# Patient Record
Sex: Female | Born: 1960
Health system: Southern US, Community
[De-identification: ages and names within clinical notes are randomized; demographics above are authoritative.]

## PROBLEM LIST (undated history)

## (undated) DIAGNOSIS — G43909 Migraine, unspecified, not intractable, without status migrainosus: Secondary | ICD-10-CM

## (undated) DIAGNOSIS — H409 Unspecified glaucoma: Secondary | ICD-10-CM

## (undated) DIAGNOSIS — I10 Essential (primary) hypertension: Secondary | ICD-10-CM

## (undated) DIAGNOSIS — Z86010 Personal history of colonic polyps: Secondary | ICD-10-CM

## (undated) DIAGNOSIS — I6529 Occlusion and stenosis of unspecified carotid artery: Secondary | ICD-10-CM

## (undated) DIAGNOSIS — Q211 Atrial septal defect: Secondary | ICD-10-CM

## (undated) DIAGNOSIS — R519 Headache, unspecified: Secondary | ICD-10-CM

## (undated) DIAGNOSIS — G459 Transient cerebral ischemic attack, unspecified: Secondary | ICD-10-CM

## (undated) DIAGNOSIS — Q2112 Patent foramen ovale: Secondary | ICD-10-CM

## (undated) DIAGNOSIS — K760 Fatty (change of) liver, not elsewhere classified: Secondary | ICD-10-CM

## (undated) DIAGNOSIS — Z86718 Personal history of other venous thrombosis and embolism: Secondary | ICD-10-CM

## (undated) DIAGNOSIS — R51 Headache: Secondary | ICD-10-CM

## (undated) DIAGNOSIS — E785 Hyperlipidemia, unspecified: Secondary | ICD-10-CM

## (undated) DIAGNOSIS — G8929 Other chronic pain: Secondary | ICD-10-CM

## (undated) DIAGNOSIS — E039 Hypothyroidism, unspecified: Secondary | ICD-10-CM

## (undated) HISTORY — PX: UPPER GASTROINTESTINAL ENDOSCOPY: SHX188

## (undated) HISTORY — DX: Occlusion and stenosis of unspecified carotid artery: I65.29

## (undated) HISTORY — DX: Unspecified glaucoma: H40.9

## (undated) HISTORY — DX: Personal history of other venous thrombosis and embolism: Z86.718

## (undated) HISTORY — DX: Transient cerebral ischemic attack, unspecified: G45.9

## (undated) HISTORY — DX: Headache: R51

## (undated) HISTORY — DX: Patent foramen ovale: Q21.12

## (undated) HISTORY — DX: Fatty (change of) liver, not elsewhere classified: K76.0

## (undated) HISTORY — DX: Headache, unspecified: R51.9

## (undated) HISTORY — DX: Hyperlipidemia, unspecified: E78.5

## (undated) HISTORY — PX: COLONOSCOPY: SHX174

## (undated) HISTORY — DX: Migraine, unspecified, not intractable, without status migrainosus: G43.909

## (undated) HISTORY — DX: Other chronic pain: G89.29

## (undated) HISTORY — DX: Atrial septal defect: Q21.1

## (undated) HISTORY — DX: Hypothyroidism, unspecified: E03.9

## (undated) HISTORY — DX: Personal history of colonic polyps: Z86.010

---

## 1980-02-03 DIAGNOSIS — G43909 Migraine, unspecified, not intractable, without status migrainosus: Secondary | ICD-10-CM

## 1980-02-03 HISTORY — DX: Migraine, unspecified, not intractable, without status migrainosus: G43.909

## 1998-02-02 HISTORY — PX: BREAST LUMPECTOMY: SHX2

## 2004-02-03 HISTORY — PX: CAROTID ARTERY - SUBCLAVIAN ARTERY BYPASS GRAFT: SUR178

## 2010-02-02 LAB — HM COLONOSCOPY: HM Colonoscopy: 1

## 2011-07-30 DIAGNOSIS — Z86010 Personal history of colonic polyps: Secondary | ICD-10-CM

## 2011-07-30 DIAGNOSIS — Z860101 Personal history of adenomatous and serrated colon polyps: Secondary | ICD-10-CM | POA: Insufficient documentation

## 2011-07-30 HISTORY — DX: Personal history of colonic polyps: Z86.010

## 2011-07-30 HISTORY — DX: Personal history of adenomatous and serrated colon polyps: Z86.0101

## 2012-09-25 ENCOUNTER — Emergency Department (HOSPITAL_COMMUNITY): Payer: BC Managed Care – PPO

## 2012-09-25 ENCOUNTER — Encounter (HOSPITAL_COMMUNITY): Payer: Self-pay | Admitting: Cardiology

## 2012-09-25 ENCOUNTER — Emergency Department (HOSPITAL_COMMUNITY)
Admission: EM | Admit: 2012-09-25 | Discharge: 2012-09-25 | Disposition: A | Discharge status: Home / Self Care | Payer: BC Managed Care – PPO | Attending: Emergency Medicine | Admitting: Emergency Medicine

## 2012-09-25 DIAGNOSIS — W010XXA Fall on same level from slipping, tripping and stumbling without subsequent striking against object, initial encounter: Secondary | ICD-10-CM | POA: Insufficient documentation

## 2012-09-25 DIAGNOSIS — S0083XA Contusion of other part of head, initial encounter: Secondary | ICD-10-CM

## 2012-09-25 DIAGNOSIS — S0003XA Contusion of scalp, initial encounter: Secondary | ICD-10-CM | POA: Insufficient documentation

## 2012-09-25 DIAGNOSIS — IMO0002 Reserved for concepts with insufficient information to code with codable children: Secondary | ICD-10-CM | POA: Insufficient documentation

## 2012-09-25 DIAGNOSIS — S01501A Unspecified open wound of lip, initial encounter: Secondary | ICD-10-CM | POA: Insufficient documentation

## 2012-09-25 DIAGNOSIS — Y9389 Activity, other specified: Secondary | ICD-10-CM | POA: Insufficient documentation

## 2012-09-25 DIAGNOSIS — Y929 Unspecified place or not applicable: Secondary | ICD-10-CM | POA: Insufficient documentation

## 2012-09-25 LAB — SAMPLE TO BLOOD BANK

## 2012-09-25 LAB — PROTIME-INR: Prothrombin Time: 22.4 seconds — ABNORMAL HIGH (ref 11.6–15.2)

## 2012-09-25 MED ORDER — METHOCARBAMOL 500 MG PO TABS: 500.0000 mg | ORAL_TABLET | Freq: Two times a day (BID) | ORAL | Status: DC

## 2012-09-25 NOTE — ED Notes (Addendum)
Pt reports that she was exercising this afternoon and stepped off a curb and lost her balance. Pt with swelling to the lip, and abrasions noted to the left hand and knee. Denies any LOC. Bleeding controlled. No distress noted. Pt reports that she is on Coumadin

## 2012-09-25 NOTE — ED Provider Notes (Signed)
CSN: [id number]     Arrival date & time 09/25/12  1246 History     First MD Initiated Contact with Patient 09/25/12 1321     Chief Complaint  Patient presents with  . Fall   (Consider location/radiation/quality/duration/timing/severity/associated sxs/prior Treatment) Patient is a 52 y.o. female presenting with fall. The history is provided by the patient and the spouse.  Fall   patient complains of facial pain after a fall today while she was walking. Tripped over a piece of concrete and landed face first. No loss of consciousness. Denies any neck pain. No upper or lower extremity paresthesias. Complains of pain and swelling to her nose as well as her lower lip. Denies any hip pain. Denies any dizziness. She is on Coumadin. Denies any vomiting.  History reviewed. No pertinent past medical history. History reviewed. No pertinent past surgical history. History reviewed. No pertinent family history. History  Substance Use Topics  . Smoking status: Never Smoker   . Smokeless tobacco: Not on file  . Alcohol Use: Yes   OB History   Grav Para Term Preterm Abortions TAB SAB Ect Mult Living                 Review of Systems  All other systems reviewed and are negative.    Allergies  Codeine  Home Medications  No current outpatient prescriptions on file. BP 119/70  Pulse 62  Temp(Src) 97.7 F (36.5 C) (Oral)  Resp 20  SpO2 96% Physical Exam  Nursing note and vitals reviewed. Constitutional: She is oriented to person, place, and time. She appears well-developed and well-nourished.  Non-toxic appearance. No distress.  HENT:  Head: Normocephalic and atraumatic.  Bilateral nasal edema and noted. Small 0.5 cm lower inner lip laceration noted that is not through and through. Left-sided chin abrasion noted.  Eyes: Conjunctivae, EOM and lids are normal. Pupils are equal, round, and reactive to light.  Neck: Normal range of motion. Neck supple. No tracheal deviation present. No mass  present.  Cardiovascular: Normal rate, regular rhythm and normal heart sounds.  Exam reveals no gallop.   No murmur heard. Pulmonary/Chest: Effort normal and breath sounds normal. No stridor. No respiratory distress. She has no decreased breath sounds. She has no wheezes. She has no rhonchi. She has no rales.  Abdominal: Soft. Normal appearance and bowel sounds are normal. She exhibits no distension. There is no tenderness. There is no rebound and no CVA tenderness.  Musculoskeletal: Normal range of motion. She exhibits no edema and no tenderness.       Legs: Neurological: She is alert and oriented to person, place, and time. She has normal strength. No cranial nerve deficit or sensory deficit. GCS eye subscore is 4. GCS verbal subscore is 5. GCS motor subscore is 6.  Skin: Skin is warm and dry. No abrasion and no rash noted.  Psychiatric: She has a normal mood and affect. Her speech is normal and behavior is normal.    ED Course   LACERATION REPAIR Date/Time: 09/25/2012 5:10 PM Performed by: Toy Baker Authorized by: Lorre Nick T Consent: Verbal consent obtained. Risks and benefits: risks, benefits and alternatives were discussed Consent given by: patient Patient identity confirmed: verbally with patient Body area: mouth Location details: lower lip, interior Laceration length: 0.5 cm Nerve involvement: none Vascular damage: no Anesthesia: local infiltration Local anesthetic: lidocaine 1% with epinephrine Anesthetic total: 6 ml Patient sedated: no Preparation: Patient was prepped and draped in the usual sterile fashion. Irrigation  solution: saline Amount of cleaning: standard Debridement: none Degree of undermining: none Mucous membrane closure: 5-0 Chromic gut Number of sutures: 4 Technique: simple Approximation: close Approximation difficulty: simple Patient tolerance: Patient tolerated the procedure well with no immediate complications.   (including critical  care time)  Labs Reviewed - No data to display No results found. No diagnosis found.  MDM  Patient's x-ray results noted.   Toy Baker, MD 09/25/12 [phone]

## 2012-09-25 NOTE — ED Notes (Signed)
Patient back to room.

## 2012-09-25 NOTE — ED Notes (Addendum)
Provided wet washcloths to patient for pain relief of the mouth area. Husband at bedside attentive to patient. Vital signs stable. Patient states no other needs at this time. Call bell within reach. Will continue to monitor patient.

## 2012-09-25 NOTE — ED Notes (Signed)
Patient presents to ED after a fall to the pavement when walking with husband towards the end of their walk together. States that she stepped up on curb and began to fall, without catching herself. She presents with abrasions to the left side of the face, hands, left knee that are no longer bleeding. She also says that she hit her mouth and is still bleeding from the mouth. Provided suction to the patient. Patient is currently on Coumadin and is worried about the bleeding. Vital signs stable. Dr. Freida Busman has seen the patient.

## 2014-02-02 LAB — HM MAMMOGRAPHY

## 2014-02-05 ENCOUNTER — Telehealth: Payer: Self-pay

## 2014-02-05 NOTE — Telephone Encounter (Signed)
error 

## 2014-02-23 ENCOUNTER — Ambulatory Visit: Payer: Self-pay | Admitting: Family

## 2014-03-12 ENCOUNTER — Ambulatory Visit (INDEPENDENT_AMBULATORY_CARE_PROVIDER_SITE_OTHER): Payer: BLUE CROSS/BLUE SHIELD | Admitting: Family

## 2014-03-12 ENCOUNTER — Encounter: Payer: Self-pay | Admitting: Family

## 2014-03-12 ENCOUNTER — Telehealth: Payer: Self-pay | Admitting: *Deleted

## 2014-03-12 VITALS — BP 126/76 | HR 58 | Temp 97.9°F | Resp 16 | Ht 68.0 in | Wt 200.2 lb

## 2014-03-12 DIAGNOSIS — Z862 Personal history of diseases of the blood and blood-forming organs and certain disorders involving the immune mechanism: Secondary | ICD-10-CM

## 2014-03-12 DIAGNOSIS — Z8679 Personal history of other diseases of the circulatory system: Secondary | ICD-10-CM

## 2014-03-12 DIAGNOSIS — E785 Hyperlipidemia, unspecified: Secondary | ICD-10-CM

## 2014-03-12 DIAGNOSIS — Q211 Atrial septal defect: Secondary | ICD-10-CM

## 2014-03-12 DIAGNOSIS — Q2112 Patent foramen ovale: Secondary | ICD-10-CM

## 2014-03-12 DIAGNOSIS — G43909 Migraine, unspecified, not intractable, without status migrainosus: Secondary | ICD-10-CM

## 2014-03-12 DIAGNOSIS — E039 Hypothyroidism, unspecified: Secondary | ICD-10-CM

## 2014-03-12 DIAGNOSIS — Z86718 Personal history of other venous thrombosis and embolism: Secondary | ICD-10-CM

## 2014-03-12 LAB — TSH: TSH: 0.72 u[IU]/mL (ref 0.35–4.50)

## 2014-03-12 MED ORDER — LEVOTHYROXINE SODIUM 100 MCG PO TABS: 100.0000 ug | ORAL_TABLET | Freq: Every day | ORAL | Status: DC

## 2014-03-12 MED ORDER — GABAPENTIN 100 MG PO CAPS: 100.0000 mg | ORAL_CAPSULE | Freq: Three times a day (TID) | ORAL | Status: DC

## 2014-03-12 NOTE — Telephone Encounter (Signed)
Gabapentin rx originally printed and re-sent via e-scribe.

## 2014-03-12 NOTE — Progress Notes (Signed)
Subjective:    Patient ID: Melinda Lopez, female    DOB: 05/08/1960, 54 y.o.   MRN: [id number]  HPI  Patient here to establish care. She was followed by Dr. Carolyne FiscalSpry at [facility]  Reports that she was placed on coumadin after she developed clots to her fingertips.  Had "black dots" at the tips of her fingers. Was told that she had PFO. She also tested positive for antiphospholipid syndrome. She follows with Hematology in [place] [place].  She reports that follow up testing was negative for antiphospholipid syndrome subsequently. Has told that she had pre-cursors to SLE but not SLE- she saw rheumatology (Dr. Donnetta HutchingZackowska).  She is followed by coumadin clini  Her cardiologist is Dr. Gillermina Hualill is leaving   Hx carotid artery surgery-  Reports hx of migraines, in 80's had "TIA",  Reports that she had right carotid stenosis.  She sees Dr. Sondra Comeruz vascular who monitors her carotid ultrasound once yearly.    Migraines- reports that migraines improved while she was on gabapentin 100mg  tid.  Worsened when it was stopped. Would like to resume.   hypothyoroid- She has been on synthroid x >5 years.    Hyperlipidemia- reports that her cholesterol has been well controlled.       Past Medical History  Diagnosis Date  . Migraine 1982    pt states migraines since then.     Review of Systems  Constitutional:       10 pounds of weight gain in last 6 months  HENT: Negative for rhinorrhea.   Respiratory: Negative for cough.   Cardiovascular: Negative for leg swelling.  Gastrointestinal: Negative for nausea, diarrhea and constipation.  Genitourinary: Negative for dysuria and frequency.  Musculoskeletal: Negative for back pain.  Skin: Negative for rash.  Neurological: Positive for headaches.  Hematological: Negative for adenopathy.  Psychiatric/Behavioral:       Denies depression/anxiety.    Past Medical History  Diagnosis Date  . Migraine 1982    pt states migraines since then.    Marland Kitchen. Hx of blood clots     fingers  . Carotid artery stenosis   . Hypothyroid   . Hyperlipemia   . PFO (patent foramen ovale)     History   Social History  . Marital Status: Married    Spouse Name: N/A  . Number of Children: N/A  . Years of Education: N/A   Occupational History  . Not on file.   Social History Main Topics  . Smoking status: Never Smoker   . Smokeless tobacco: Not on file  . Alcohol Use: Yes  . Drug Use: No  . Sexual Activity: Not on file   Other Topics Concern  . 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 Carotid artery - subclavian artery bypass graft Right 2006  . Breast surgery  2000    lumpectomy right    Family History  Problem Relation Age of Onset  . Heart disease Mother   . Kidney disease Mother   . Heart disease Father   . Diabetes Sister   . Kidney disease Sister   . Heart murmur Brother   . Heart disease Maternal  Grandmother     Allergies  Allergen Reactions  . Shellfish Allergy Anaphylaxis  . Codeine Nausea Only  . Morphine And Related Other (See Comments)    slurring words  . Phenergan [Promethazine] Other (See Comments)    Causes blood presser to bottom out    Current Outpatient Prescriptions on File Prior to Visit  Medication Sig Dispense Refill  . amLODipine (NORVASC) 2.5 MG tablet Take 2.5 mg by mouth daily.    Marland Kitchen atorvastatin (LIPITOR) 20 MG tablet Take 20 mg by mouth daily.    Marland Kitchen warfarin (COUMADIN) 10 MG tablet Take 10 mg by mouth daily.    . Azelastine-Fluticasone 137-50 MCG/ACT SUSP Place 1 spray into both nostrils as needed (allergies).    . methocarbamol (ROBAXIN) 500 MG tablet Take 1 tablet (500 mg total) by mouth 2 (two) times daily.  (Patient not taking: Reported on 03/12/2014) 20 tablet 0   No current facility-administered medications on file prior to visit.    BP 126/76 mmHg  Pulse 58  Temp(Src) 97.9 F (36.6 C) (Oral)  Resp 16  Ht  (1.727 m)  Wt 200 lb 3.2 oz (90.81 kg)  BMI 30.45 kg/m2  SpO2 97%       Objective:    Physical Exam  Constitutional: She is oriented to person, place, and time. She appears well-developed and well-nourished. No distress.  HENT:  Head: Normocephalic and atraumatic.  Mouth/Throat: No oropharyngeal exudate.  Eyes: No scleral icterus.  Neck: No thyromegaly present.  Cardiovascular: Normal rate, regular rhythm and normal heart sounds.   No murmur heard. Pulmonary/Chest: Effort normal and breath sounds normal. No respiratory distress. She has no wheezes.  Abdominal: Soft. Bowel sounds are normal. She exhibits no distension. There is no tenderness. There is no rebound.  Musculoskeletal: She exhibits no edema.  Lymphadenopathy:    She has no cervical adenopathy.  Neurological: She is alert and oriented to person, place, and time.  Skin: Skin is warm and dry.  Psychiatric: She has a normal mood and affect. Her behavior is normal. Judgment and thought content normal.    BP 126/76 mmHg  Pulse 58  Temp(Src) 97.9 F (36.6 C) (Oral)  Resp 16  Ht  (1.727 m)  Wt 200 lb 3.2 oz (90.81 kg)  BMI 30.45 kg/m2  SpO2 97% Wt Readings from Last 3 Encounters:  03/12/14 200 lb 3.2 oz (90.81 kg)     Lab Results  Component Value Date   INR 2.04* 09/25/2012    Ct Head Wo Contrast  09/25/2012   *RADIOLOGY REPORT*  Clinical Data: Swelling and abrasions post trauma.  CT HEAD WITHOUT CONTRAST  Technique:  Contiguous axial images were obtained from the base of the skull through the vertex without contrast.  Comparison: None.  Findings:   There is no evidence of acute intracranial hemorrhage, brain edema, mass lesion, acute infarction,   mass effect, or midline shift. Acute infarct may  be inapparent on noncontrast CT. No other intra-axial abnormalities are seen, and the ventricles and sulci are within normal limits in size and symmetry.   No abnormal extra-axial fluid collections or masses are identified.  No significant calvarial abnormality.  IMPRESSION: 1. Negative for bleed or other acute intracranial process.   Original Report Authenticated By: D. Andria Rhein, MD   Dg Shoulder Left  09/25/2012   *RADIOLOGY REPORT*  Clinical Data: Left shoulder pain, fell on concrete today  LEFT SHOULDER - 2+ VIEW  Comparison: None  Findings: Bones appear demineralized.  AC joint alignment normal. No acute fracture, dislocation, or bone destruction. Visualized left ribs appear intact.  IMPRESSION: No acute osseous abnormalities.   Original Report Authenticated By: Ulyses Southward, M.D.   Dg Knee Complete 4 Views Left  09/25/2012   *RADIOLOGY REPORT*  Clinical Data: Fall with left knee pain  LEFT KNEE - COMPLETE 4+ VIEW  Comparison: None.  Findings: No fracture.  No bone lesion.  The knee joint is normally spaced and aligned.  There is no joint effusion.  The soft tissues are unremarkable.  IMPRESSION: Normal left knee radiographs.   Original Report Authenticated By: Amie Portland, M.D.   Ct Maxillofacial Wo Cm  09/25/2012   *RADIOLOGY REPORT*  Clinical Data: Swelling and abrasions post blunt trauma.  On Coumadin.  CT MAXILLOFACIAL WITHOUT CONTRAST  Technique:  Multidetector CT imaging of the maxillofacial structures was performed. Multiplanar CT image reconstructions were also generated.  Comparison: None.  Findings: Orbits and globes intact.  Paranasal sinuses are normally developed without fluid level.  There is some   mild mucoperiosteal thickening in the right maxillary sinus.  Nasal septum midline. Zygomatic arches intact.  Temporomandibular joints seated. Mandible intact.  Orbital floors intact. Visualized portions of upper cervical spine unremarkable.  Multiple dental restorations.  IMPRESSION:  1.   Negative for fracture or other acute abnormality.   Original Report Authenticated By: D. Andria Rhein, MD       Assessment & Plan:   Problem List Items Addressed This Visit    None       Lemont Fillers., NP

## 2014-03-12 NOTE — Patient Instructions (Addendum)
Please complete lab work prior to leaving.  You will be contacted about your referrals. Schedule a fasting physical at the front desk.

## 2014-03-12 NOTE — Progress Notes (Signed)
Pre visit review using our clinic review tool, if applicable. No additional management support is needed unless otherwise documented below in the visit note. 

## 2014-03-13 ENCOUNTER — Encounter: Payer: Self-pay | Admitting: Family

## 2014-03-15 ENCOUNTER — Encounter: Payer: Self-pay | Admitting: Family

## 2014-03-15 ENCOUNTER — Other Ambulatory Visit: Payer: Self-pay

## 2014-03-15 DIAGNOSIS — Q2112 Patent foramen ovale: Secondary | ICD-10-CM | POA: Insufficient documentation

## 2014-03-15 DIAGNOSIS — E785 Hyperlipidemia, unspecified: Secondary | ICD-10-CM | POA: Insufficient documentation

## 2014-03-15 DIAGNOSIS — G43909 Migraine, unspecified, not intractable, without status migrainosus: Secondary | ICD-10-CM | POA: Insufficient documentation

## 2014-03-15 DIAGNOSIS — Z8679 Personal history of other diseases of the circulatory system: Secondary | ICD-10-CM | POA: Insufficient documentation

## 2014-03-15 DIAGNOSIS — Q211 Atrial septal defect: Secondary | ICD-10-CM | POA: Insufficient documentation

## 2014-03-15 DIAGNOSIS — Z86718 Personal history of other venous thrombosis and embolism: Secondary | ICD-10-CM | POA: Insufficient documentation

## 2014-03-15 DIAGNOSIS — E039 Hypothyroidism, unspecified: Secondary | ICD-10-CM | POA: Insufficient documentation

## 2014-03-15 MED ORDER — LEVOTHYROXINE SODIUM 100 MCG PO TABS: 100.0000 ug | ORAL_TABLET | Freq: Every day | ORAL | Status: DC

## 2014-03-15 NOTE — Assessment & Plan Note (Signed)
She is requesting referral to cardiologist. Will arrange. Clinically stable.

## 2014-03-15 NOTE — Assessment & Plan Note (Signed)
Resume gabapentin for migraine prophylaxis as this has helped her previously.

## 2014-03-15 NOTE — Assessment & Plan Note (Signed)
She is maintained on coumadin.  Would like referral to hematologist within the [facility].

## 2014-03-15 NOTE — Assessment & Plan Note (Signed)
Reports well controlled on lipitor. Continue same.  Plan flp next visit.

## 2014-03-15 NOTE — Assessment & Plan Note (Signed)
She is being monitored by vascular with annual carotid ultrasounds.

## 2014-03-15 NOTE — Assessment & Plan Note (Signed)
Lab Results  Component Value Date   TSH 0.72 03/12/2014   TSH stable, continue current dose of synthroid.

## 2014-04-09 ENCOUNTER — Encounter: Payer: Self-pay | Admitting: *Deleted

## 2014-04-09 ENCOUNTER — Telehealth: Payer: Self-pay | Admitting: *Deleted

## 2014-04-09 NOTE — Telephone Encounter (Signed)
Pre-Visit Call completed with patient and chart updated.   Pre-Visit Info documented in Specialty Comments under [employer].    

## 2014-04-10 ENCOUNTER — Encounter: Payer: Self-pay | Admitting: Family

## 2014-04-10 ENCOUNTER — Ambulatory Visit (INDEPENDENT_AMBULATORY_CARE_PROVIDER_SITE_OTHER): Payer: BLUE CROSS/BLUE SHIELD | Admitting: Family

## 2014-04-10 ENCOUNTER — Other Ambulatory Visit (HOSPITAL_COMMUNITY)
Admission: RE | Admit: 2014-04-10 | Discharge: 2014-04-10 | Disposition: A | Discharge status: Home / Self Care | Payer: BLUE CROSS/BLUE SHIELD | Source: Ambulatory Visit | Attending: Family | Admitting: Family

## 2014-04-10 VITALS — BP 120/86 | HR 59 | Temp 97.9°F | Resp 16 | Ht 68.0 in | Wt 201.2 lb

## 2014-04-10 DIAGNOSIS — Z01419 Encounter for gynecological examination (general) (routine) without abnormal findings: Secondary | ICD-10-CM | POA: Diagnosis not present

## 2014-04-10 DIAGNOSIS — Z0000 Encounter for general adult medical examination without abnormal findings: Secondary | ICD-10-CM

## 2014-04-10 DIAGNOSIS — R109 Unspecified abdominal pain: Secondary | ICD-10-CM

## 2014-04-10 DIAGNOSIS — Z1151 Encounter for screening for human papillomavirus (HPV): Secondary | ICD-10-CM | POA: Diagnosis present

## 2014-04-10 LAB — HEPATIC FUNCTION PANEL
ALK PHOS: 78 U/L (ref 39–117)
ALT: 47 U/L — AB (ref 0–35)
AST: 35 U/L (ref 0–37)
Albumin: 4.6 g/dL (ref 3.5–5.2)
BILIRUBIN DIRECT: 0.1 mg/dL (ref 0.0–0.3)
BILIRUBIN TOTAL: 0.3 mg/dL (ref 0.2–1.2)
Total Protein: 7.4 g/dL (ref 6.0–8.3)

## 2014-04-10 LAB — CBC WITH DIFFERENTIAL/PLATELET
BASOS PCT: 0.4 % (ref 0.0–3.0)
Basophils Absolute: 0 10*3/uL (ref 0.0–0.1)
EOS ABS: 0.1 10*3/uL (ref 0.0–0.7)
Eosinophils Relative: 2 % (ref 0.0–5.0)
HEMATOCRIT: 40 % (ref 36.0–46.0)
HEMOGLOBIN: 13.6 g/dL (ref 12.0–15.0)
LYMPHS ABS: 1.5 10*3/uL (ref 0.7–4.0)
Lymphocytes Relative: 33.2 % (ref 12.0–46.0)
MCHC: 34 g/dL (ref 30.0–36.0)
MCV: 92.4 fl (ref 78.0–100.0)
MONO ABS: 0.3 10*3/uL (ref 0.1–1.0)
Monocytes Relative: 7.5 % (ref 3.0–12.0)
NEUTROS ABS: 2.6 10*3/uL (ref 1.4–7.7)
Neutrophils Relative %: 56.9 % (ref 43.0–77.0)
Platelets: 289 10*3/uL (ref 150.0–400.0)
RBC: 4.32 Mil/uL (ref 3.87–5.11)
RDW: 13.6 % (ref 11.5–15.5)
WBC: 4.6 10*3/uL (ref 4.0–10.5)

## 2014-04-10 LAB — URINALYSIS, ROUTINE W REFLEX MICROSCOPIC
BILIRUBIN URINE: NEGATIVE
HGB URINE DIPSTICK: NEGATIVE
Ketones, ur: NEGATIVE
Leukocytes, UA: NEGATIVE
Nitrite: NEGATIVE
RBC / HPF: NONE SEEN (ref 0–?)
Specific Gravity, Urine: <=1.005 — AB (ref 1.000–1.030)
TOTAL PROTEIN, URINE-UPE24: NEGATIVE
URINE GLUCOSE: NEGATIVE
UROBILINOGEN UA: 0.2 (ref 0.0–1.0)
WBC, UA: NONE SEEN (ref 0–?)
pH: 7 (ref 5.0–8.0)

## 2014-04-10 LAB — LIPID PANEL
CHOL/HDL RATIO: 2
CHOLESTEROL: 144 mg/dL (ref 0–200)
HDL: 59.1 mg/dL (ref >39.00)
LDL CALC: 72 mg/dL (ref 0–99)
NonHDL: 84.9
TRIGLYCERIDES: 65 mg/dL (ref 0.0–149.0)
VLDL: 13 mg/dL (ref 0.0–40.0)

## 2014-04-10 LAB — BASIC METABOLIC PANEL
BUN: 11 mg/dL (ref 6–23)
CALCIUM: 9.8 mg/dL (ref 8.4–10.5)
CHLORIDE: 104 meq/L (ref 96–112)
CO2: 28 mEq/L (ref 19–32)
CREATININE: 0.75 mg/dL (ref 0.40–1.20)
GFR: 103.82 mL/min (ref >60.00)
Glucose, Bld: 92 mg/dL (ref 70–99)
Potassium: 3.8 mEq/L (ref 3.5–5.1)
SODIUM: 139 meq/L (ref 135–145)

## 2014-04-10 MED ORDER — AMLODIPINE BESYLATE 2.5 MG PO TABS: 2.5000 mg | ORAL_TABLET | Freq: Every day | ORAL | Status: DC

## 2014-04-10 MED ORDER — LEVOTHYROXINE SODIUM 100 MCG PO TABS: 100.0000 ug | ORAL_TABLET | Freq: Every day | ORAL | Status: DC

## 2014-04-10 NOTE — Progress Notes (Signed)
Pre visit review using our clinic review tool, if applicable. No additional management support is needed unless otherwise documented below in the visit note. 

## 2014-04-10 NOTE — Patient Instructions (Signed)
Please complete lab work prior to leaving. You will be contacted about your abdominal Ultrasound. Please schedule a follow up appointment in 6 months.

## 2014-04-10 NOTE — Progress Notes (Signed)
Subjective:    Patient ID: Melinda Lopez, female    DOB: 06/18/1960, 54 y.o.   MRN: [id number]  HPI  [name] is a 54 yr old female who presents for cpx.  Immunizations up to date Walking 4 miles 6 days a week No weight loss Colo due 2018 per dr. Norma Fredricksonoledo Mammogram 1/16- normal per pt. Pap today-  2011, denies hx of abnormal pap.   Right sided intermittent abdominal pain x 3 months.  Review of Systems  Constitutional: Negative for unexpected weight change.  HENT: Negative for hearing loss and rhinorrhea.   Eyes: Negative for visual disturbance.  Respiratory: Negative for cough.   Cardiovascular: Negative for leg swelling.  Gastrointestinal: Negative for nausea, diarrhea and constipation.  Genitourinary: Negative for dysuria and frequency.  Musculoskeletal: Negative for myalgias and arthralgias.  Skin: Negative for rash.  Neurological: Negative for headaches.  Hematological: Negative for adenopathy.  Psychiatric/Behavioral: Negative for dysphoric mood and agitation.        Past Medical History  Diagnosis Date  . Migraine 1982    pt states migraines since then.   Marland Kitchen. Hx of blood clots     fingers  . Carotid artery stenosis   . Hypothyroid   . Hyperlipemia   . PFO (patent foramen ovale)     History   Social History  . Marital Status: Married    Spouse Name: N/A  . Number of Children: N/A  . Years of Education: N/A   Occupational History  . Not on file.   Social History Main Topics  . Smoking status: Never Smoker   . Smokeless tobacco: Not on file  . Alcohol Use: Yes  . Drug Use: No  . Sexual Activity: Not on file   Other Topics Concern  . 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 Carotid artery - subclavian artery bypass graft Right 2006  . Breast surgery  2000    lumpectomy right    Family History  Problem Relation Age of Onset  . Heart disease Mother   . Kidney disease Mother   . Heart disease Father   . Diabetes Sister   . Kidney disease Sister   . Heart murmur Brother   . Heart disease Maternal Grandmother     Allergies  Allergen Reactions  . Shellfish Allergy Anaphylaxis  . Codeine Nausea Only  . Morphine And Related Other (See Comments)    slurring words  . Phenergan [Promethazine] Other (See Comments)    Causes blood presser to bottom out    Current Outpatient Prescriptions on File Prior to Visit  Medication Sig Dispense Refill  . ALPRAZolam (XANAX) 0.5 MG tablet Take 0.5 mg by mouth at bedtime as needed for anxiety.    Marland Kitchen. amLODipine (NORVASC) 2.5 MG tablet Take 2.5 mg by mouth daily.    . Azelastine-Fluticasone 137-50 MCG/ACT SUSP Place 1 spray into both nostrils as needed (allergies).    . gabapentin (NEURONTIN) 100 MG capsule Take 1 capsule (100 mg total) by mouth 3 (three) times daily. 90 capsule 3  . levothyroxine (SYNTHROID, LEVOTHROID) 100 MCG tablet Take 1 tablet (100 mcg total) by mouth daily. 90 tablet  3  . warfarin (COUMADIN) 10 MG tablet Take 10 mg by mouth daily.     No current facility-administered medications on file prior to visit.    BP 120/86 mmHg  Pulse 59  Temp(Src) 97.9 F (36.6 C) (Oral)  Resp 16  Ht  (1.727 m)  Wt 201 lb 3.2 oz (91.264 kg)  BMI 30.60 kg/m2  SpO2 99%    Objective:   Physical Exam  Physical Exam  Constitutional: She is oriented to person, place, and time. She appears well-developed and well-nourished. No distress.  HENT:  Head: Normocephalic and atraumatic.  Right Ear: Tympanic membrane and ear canal normal.  Left Ear: Tympanic membrane and ear canal normal.  Mouth/Throat: Oropharynx is clear and moist.  Eyes: Pupils are equal, round, and reactive to light. No scleral  icterus.  Neck: Normal range of motion. No thyromegaly present.  Cardiovascular: Normal rate and regular rhythm.   No murmur heard. Pulmonary/Chest: Effort normal and breath sounds normal. No respiratory distress. He has no wheezes. She has no rales. She exhibits no tenderness.  Abdominal: Soft. Bowel sounds are normal. He exhibits no distension and no mass. There is mild right sided abdominal tenderness without guarding.  Musculoskeletal: She exhibits no edema.  Lymphadenopathy:    She has no cervical adenopathy.  Neurological: She is alert and oriented to person, place, and time. She has normal patellar reflexes. She exhibits normal muscle tone. Coordination normal.  Skin: Skin is warm and dry.  Psychiatric: She has a normal mood and affect. Her behavior is normal. Judgment and thought content normal.  Breasts: Examined lying Right: Without masses, retractions, discharge or axillary adenopathy.  Left: Without masses, retractions, discharge or axillary adenopathy.  Inguinal/mons: Normal without inguinal adenopathy  External genitalia: Normal  BUS/Urethra/Skene's glands: Normal  Bladder: Normal  Vagina: Normal  Cervix: Normal  Uterus: normal in size, shape and contour. Midline and mobile  Adnexa/parametria:  Rt: Without masses or tenderness.  Lt: Without masses or tenderness.  Anus and perineum: Normal           Assessment & Plan:         Assessment & Plan:

## 2014-04-12 ENCOUNTER — Telehealth: Payer: Self-pay | Admitting: Family

## 2014-04-12 ENCOUNTER — Encounter: Payer: Self-pay | Admitting: Family

## 2014-04-12 ENCOUNTER — Ambulatory Visit (HOSPITAL_BASED_OUTPATIENT_CLINIC_OR_DEPARTMENT_OTHER): Payer: BLUE CROSS/BLUE SHIELD

## 2014-04-12 DIAGNOSIS — Z0000 Encounter for general adult medical examination without abnormal findings: Secondary | ICD-10-CM | POA: Insufficient documentation

## 2014-04-12 LAB — CYTOLOGY - PAP

## 2014-04-12 NOTE — Telephone Encounter (Signed)
Patient is requesting to speak to someone regarding abdominal ultrasound

## 2014-04-12 NOTE — Assessment & Plan Note (Signed)
Discussed healthy diet, exercise weight loss.  Obtain routine labs, pap performed. Colo due 2018, immunizations reviewed and up to date.

## 2014-04-13 ENCOUNTER — Encounter: Payer: Self-pay | Admitting: Family

## 2014-04-13 NOTE — Telephone Encounter (Signed)
Left message for pt to return my call.

## 2014-04-14 NOTE — Telephone Encounter (Signed)
Left message for pt to return my call.

## 2014-04-16 NOTE — Telephone Encounter (Signed)
Left message for pt to return my call.

## 2014-05-18 ENCOUNTER — Other Ambulatory Visit: Payer: Self-pay | Admitting: Family

## 2014-05-18 DIAGNOSIS — R109 Unspecified abdominal pain: Secondary | ICD-10-CM

## 2014-05-23 ENCOUNTER — Ambulatory Visit: Payer: BLUE CROSS/BLUE SHIELD | Admitting: Cardiology

## 2014-05-28 ENCOUNTER — Encounter: Payer: Self-pay | Admitting: Family

## 2014-05-28 ENCOUNTER — Telehealth: Payer: Self-pay | Admitting: Family

## 2014-05-28 ENCOUNTER — Ambulatory Visit (HOSPITAL_BASED_OUTPATIENT_CLINIC_OR_DEPARTMENT_OTHER)
Admission: RE | Admit: 2014-05-28 | Discharge: 2014-05-28 | Disposition: A | Discharge status: Home / Self Care | Payer: BLUE CROSS/BLUE SHIELD | Source: Ambulatory Visit | Attending: Family | Admitting: Family

## 2014-05-28 DIAGNOSIS — R7989 Other specified abnormal findings of blood chemistry: Secondary | ICD-10-CM | POA: Insufficient documentation

## 2014-05-28 DIAGNOSIS — I700 Atherosclerosis of aorta: Secondary | ICD-10-CM | POA: Insufficient documentation

## 2014-05-28 DIAGNOSIS — R1011 Right upper quadrant pain: Secondary | ICD-10-CM | POA: Diagnosis not present

## 2014-05-28 DIAGNOSIS — R109 Unspecified abdominal pain: Secondary | ICD-10-CM

## 2014-05-28 MED ORDER — MOMETASONE FUROATE 50 MCG/ACT NA SUSP: 2.0000 | Freq: Every day | NASAL | Status: DC

## 2014-05-28 MED ORDER — ALPRAZOLAM 0.5 MG PO TABS: 0.5000 mg | ORAL_TABLET | Freq: Every evening | ORAL | Status: DC | PRN

## 2014-05-28 NOTE — Telephone Encounter (Signed)
See mychart message.  I have sent rx for nasonex. I am happy to refill xanax-I have printed rx.  Since this is our first time filling, we will need her to pick up rx sign controlled substance contract and provide UDS- per our protocol. Going forward we can call in.

## 2014-05-28 NOTE — Telephone Encounter (Signed)
Rx and contract shredded. Will address per pt's request at next physical.

## 2014-05-28 NOTE — Telephone Encounter (Signed)
Faxed note to [facility] requesting they cancel Rx sent to them today at 2:05pm. Rx sent to [facility] per pt's request.

## 2014-05-28 NOTE — Telephone Encounter (Signed)
Opened in error

## 2014-05-29 ENCOUNTER — Encounter: Payer: Self-pay | Admitting: Family

## 2014-05-29 DIAGNOSIS — K760 Fatty (change of) liver, not elsewhere classified: Secondary | ICD-10-CM | POA: Insufficient documentation

## 2014-07-27 ENCOUNTER — Encounter: Payer: Self-pay | Admitting: Family

## 2014-07-31 MED ORDER — ATORVASTATIN CALCIUM 40 MG PO TABS: 20.0000 mg | ORAL_TABLET | Freq: Every day | ORAL | Status: DC

## 2014-07-31 NOTE — Telephone Encounter (Signed)
Rx faxed to [facility] per Melissa's request.    KP

## 2014-07-31 NOTE — Telephone Encounter (Signed)
Kim, could you pls send? Thank you!    ----- Message -----     From: Dorette GrateKaylyn C Faulkner, CMA     Sent: 07/27/2014 11:52 AM      To: Sandford CrazeMelissa O'Sullivan, NP    Subject: FW: Visit Follow-Up Question                         ----- Message -----     From: Alyson Locketonnette Angelo     Sent: 07/27/2014 10:16 AM      To: [facility] Clinical Pool    Subject: Visit Follow-Up Question                   Hi,    I am requesting a re-fill on my Lipitor 40mg .     For insurance purposes, it has to be name brand NOT generic noted and it needs to be a 90day supply.     my pharmacy is [facility]. their fax number is [phone] and their phone number is [phone].        Thank you,

## 2014-09-21 ENCOUNTER — Telehealth: Payer: Self-pay | Admitting: *Deleted

## 2014-09-21 MED ORDER — AMLODIPINE BESYLATE 2.5 MG PO TABS: 2.5000 mg | ORAL_TABLET | Freq: Every day | ORAL | Status: DC

## 2014-09-21 NOTE — Telephone Encounter (Signed)
Received fax from [facility] requesting refills of amlodipine besylate 2.5mg .  Pt due for f/u 10/11/14 and does not have appt scheduled. 90 day supply sent. Please call pt to arrange f/u. Thanks!

## 2015-01-15 ENCOUNTER — Encounter: Payer: Self-pay | Admitting: Family

## 2015-01-16 MED ORDER — AMLODIPINE BESYLATE 2.5 MG PO TABS: 2.5000 mg | ORAL_TABLET | Freq: Every day | ORAL | Status: DC

## 2015-02-11 ENCOUNTER — Encounter: Payer: Self-pay | Admitting: Family

## 2015-02-11 MED ORDER — ATORVASTATIN CALCIUM 40 MG PO TABS: 20.0000 mg | ORAL_TABLET | Freq: Every day | ORAL | Status: DC

## 2015-02-20 ENCOUNTER — Ambulatory Visit (INDEPENDENT_AMBULATORY_CARE_PROVIDER_SITE_OTHER): Payer: BLUE CROSS/BLUE SHIELD | Admitting: Family

## 2015-02-20 ENCOUNTER — Encounter: Payer: Self-pay | Admitting: Family

## 2015-02-20 VITALS — BP 102/78 | HR 60 | Temp 97.9°F | Resp 16 | Ht 68.0 in | Wt 199.6 lb

## 2015-02-20 DIAGNOSIS — Z1159 Encounter for screening for other viral diseases: Secondary | ICD-10-CM

## 2015-02-20 DIAGNOSIS — E785 Hyperlipidemia, unspecified: Secondary | ICD-10-CM | POA: Diagnosis not present

## 2015-02-20 DIAGNOSIS — I10 Essential (primary) hypertension: Secondary | ICD-10-CM

## 2015-02-20 DIAGNOSIS — E039 Hypothyroidism, unspecified: Secondary | ICD-10-CM | POA: Diagnosis not present

## 2015-02-20 DIAGNOSIS — M2550 Pain in unspecified joint: Secondary | ICD-10-CM

## 2015-02-20 DIAGNOSIS — Z86718 Personal history of other venous thrombosis and embolism: Secondary | ICD-10-CM

## 2015-02-20 LAB — LIPID PANEL
CHOLESTEROL: 134 mg/dL (ref 0–200)
HDL: 57.3 mg/dL (ref >39.00)
LDL Cholesterol: 66 mg/dL (ref 0–99)
NonHDL: 77.03
Total CHOL/HDL Ratio: 2
Triglycerides: 55 mg/dL (ref 0.0–149.0)
VLDL: 11 mg/dL (ref 0.0–40.0)

## 2015-02-20 LAB — BASIC METABOLIC PANEL
BUN: 11 mg/dL (ref 6–23)
CHLORIDE: 105 meq/L (ref 96–112)
CO2: 27 mEq/L (ref 19–32)
Calcium: 9.1 mg/dL (ref 8.4–10.5)
Creatinine, Ser: 0.73 mg/dL (ref 0.40–1.20)
GFR: 88.23 mL/min (ref >60.00)
Glucose, Bld: 99 mg/dL (ref 70–99)
Potassium: 3.8 mEq/L (ref 3.5–5.1)
Sodium: 140 mEq/L (ref 135–145)

## 2015-02-20 LAB — TSH: TSH: 1.74 u[IU]/mL (ref 0.35–4.50)

## 2015-02-20 LAB — HEPATITIS C ANTIBODY: HCV AB: NEGATIVE

## 2015-02-20 MED ORDER — ATORVASTATIN CALCIUM 20 MG PO TABS: 20.0000 mg | ORAL_TABLET | Freq: Every day | ORAL | Status: DC

## 2015-02-20 MED ORDER — ALPRAZOLAM 0.5 MG PO TABS: 0.5000 mg | ORAL_TABLET | Freq: Every evening | ORAL | Status: DC | PRN

## 2015-02-20 MED ORDER — LEVOTHYROXINE SODIUM 100 MCG PO TABS: 100.0000 ug | ORAL_TABLET | Freq: Every day | ORAL | Status: DC

## 2015-02-20 NOTE — Assessment & Plan Note (Signed)
Stable on statin, continue same.  

## 2015-02-20 NOTE — Progress Notes (Signed)
Subjective:    Patient ID: Melinda Lopez, female    DOB: 10-11-1960, 55 y.o.   MRN: [id number]  HPI   Melinda Lopez is a 55 yr old female who presents today for follow up.  1) Hyperlipidemia- Patient is maintained on atorvastatin.  Lab Results  Component Value Date   CHOL 144 04/10/2014   HDL 59.10 04/10/2014   LDLCALC 72 04/10/2014   TRIG 65.0 04/10/2014   CHOLHDL 2 04/10/2014   2) Hypothyroid- maintained on synthroid- reports + fatigue.  Lab Results  Component Value Date   TSH 0.72 03/12/2014   3) Hx of blood clots- maintained on coumadin, will follow back up with her hematologist at [facility].     Review of Systems Some right hip and right knee pain since September.    Past Medical History  Diagnosis Date  . Migraine 1982    pt states migraines since then.   Marland Kitchen Hx of blood clots     fingers  . Carotid artery stenosis   . Hypothyroid   . Hyperlipemia   . PFO (patent foramen ovale)     Social History   Social History  . Marital Status: Married    Spouse Name: N/A  . Number of Children: N/A  . Years of Education: N/A   Occupational History  . Not on file.   Social History Main Topics  . Smoking status: Never Smoker   . Smokeless tobacco: Not on file  . Alcohol Use: Yes  . Drug Use: No  . Sexual Activity: Not on file   Other Topics Concern  . 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 Carotid artery - subclavian artery bypass graft Right 2006  . Breast surgery  2000    lumpectomy right    Family History  Problem Relation Age of Onset  . Heart disease Mother   . Kidney disease Mother   . Heart disease Father   . Diabetes Sister   . Kidney disease Sister   . Heart  murmur Brother   . Heart disease Maternal Grandmother     Allergies  Allergen Reactions  . Shellfish Allergy Anaphylaxis  . Codeine Nausea Only  . Morphine And Related Other (See Comments)    slurring words  . Phenergan [Promethazine] Other (See Comments)    Causes blood presser to bottom out    Current Outpatient Prescriptions on File Prior to Visit  Medication Sig Dispense Refill  . amLODipine (NORVASC) 2.5 MG tablet Take 1 tablet (2.5 mg total) by mouth daily. 90 tablet 0  . Azelastine-Fluticasone 137-50 MCG/ACT SUSP Place 1 spray into both nostrils as needed (allergies).    . gabapentin (NEURONTIN) 100 MG capsule Take 1 capsule (100 mg total) by mouth 3 (three) times daily. 90 capsule 3  . mometasone (NASONEX) 50 MCG/ACT nasal spray Place 2 sprays into the nose daily. 17 g 5  . warfarin (COUMADIN) 10 MG tablet Take 10 mg by mouth daily.     No current facility-administered medications on file prior to visit.    BP 102/78 mmHg  Pulse 60  Temp(Src) 97.9 F (36.6 C) (Oral)  Resp 16  Ht 5'  8" (1.727 m)  Wt 199 lb 9.6 oz (90.538 kg)  BMI 30.36 kg/m2       Objective:   Physical Exam  Constitutional: She is oriented to person, place, and time. She appears well-developed and well-nourished.  HENT:  Head: Normocephalic and atraumatic.  Cardiovascular: Normal rate, regular rhythm and normal heart sounds.   No murmur heard. Pulmonary/Chest: Effort normal and breath sounds normal. No respiratory distress. She has no wheezes.  Musculoskeletal: She exhibits no edema.  Neurological: She is alert and oriented to person, place, and time.  Skin: Skin is warm and dry.  Psychiatric: She has a normal mood and affect. Her behavior is normal. Judgment and thought content normal.          Assessment & Plan:  Joint pain- hip and knee.  Avoid NSAIDS due to coumadin.  Refer to sports medicine for further evaluation.

## 2015-02-20 NOTE — Patient Instructions (Signed)
Please complete lab work prior to leaving. You will be contacted about your referral to sports medicine (Dr. Pearletha Forge)

## 2015-02-20 NOTE — Assessment & Plan Note (Signed)
+   fatigue, continue synthroid, obtain TSH.

## 2015-02-20 NOTE — Progress Notes (Signed)
Pre visit review using our clinic review tool, if applicable. No additional management support is needed unless otherwise documented below in the visit note. 

## 2015-02-20 NOTE — Assessment & Plan Note (Signed)
She is maintained on coumadin which is prescribed by her hematologist.  She will arrange follow up with hematology.

## 2015-02-21 ENCOUNTER — Encounter: Payer: Self-pay | Admitting: Family

## 2015-02-22 ENCOUNTER — Ambulatory Visit: Payer: BLUE CROSS/BLUE SHIELD | Admitting: Family Medicine

## 2015-02-25 DIAGNOSIS — Z7901 Long term (current) use of anticoagulants: Secondary | ICD-10-CM | POA: Insufficient documentation

## 2015-02-25 DIAGNOSIS — D6859 Other primary thrombophilia: Secondary | ICD-10-CM | POA: Insufficient documentation

## 2015-02-27 ENCOUNTER — Encounter: Payer: Self-pay | Admitting: Family Medicine

## 2015-02-27 ENCOUNTER — Ambulatory Visit (INDEPENDENT_AMBULATORY_CARE_PROVIDER_SITE_OTHER): Payer: BLUE CROSS/BLUE SHIELD | Admitting: Family Medicine

## 2015-02-27 VITALS — BP 130/84 | HR 65 | Ht 67.0 in | Wt 198.0 lb

## 2015-02-27 DIAGNOSIS — M25561 Pain in right knee: Secondary | ICD-10-CM

## 2015-02-27 DIAGNOSIS — M25551 Pain in right hip: Secondary | ICD-10-CM | POA: Diagnosis not present

## 2015-02-27 NOTE — Patient Instructions (Signed)
Your pain in your hip and knee are primarily due to arthritis. These are the different classes of medicine you can take for this: Tylenol  1-2 tabs three times a day for pain. Glucosamine sulfate  twice a day is a supplement that may help. (turmeric, devils claw other considerations but I would run this by your coumadin prescriber first before trying them). Capsaicin, aspercreme, or biofreeze topically up to four times a day may also help with pain. Cortisone injections are an option. If cortisone injections do not help, there are different types of shots that may help but they take longer to take effect (viscosupplementation). It's important that you continue to stay active. Straight leg raises, knee extensions 3 sets of 10 once a day (add ankle weight if these become too easy). Consider physical therapy to strengthen muscles around the joint that hurts to take pressure off of the joint itself. Shoe inserts with good arch support may be helpful. Heat or ice 15 minutes at a time 3-4 times a day as needed to help with pain. Water aerobics and cycling with low resistance are the best two types of exercise for arthritis.  You also have IT band syndrome and trochanteric bursitis. Avoid painful activities as much as possible. Ice over area of pain 3-4 times a day for 15 minutes at a time Hip side raise and standing hip rotation exercises 3 sets of 10-15 once a day - add weights if this becomes too easy. Stretches - pick 2 and hold for 20-30 seconds x 3 - do once or twice a day. If not improving, can consider physical therapy and/or steroid injection. Follow up with me in 5-6 weeks.

## 2015-02-28 DIAGNOSIS — M222X9 Patellofemoral disorders, unspecified knee: Secondary | ICD-10-CM | POA: Insufficient documentation

## 2015-02-28 DIAGNOSIS — M25561 Pain in right knee: Secondary | ICD-10-CM

## 2015-02-28 DIAGNOSIS — M25551 Pain in right hip: Principal | ICD-10-CM

## 2015-02-28 DIAGNOSIS — M7061 Trochanteric bursitis, right hip: Secondary | ICD-10-CM | POA: Insufficient documentation

## 2015-02-28 NOTE — Progress Notes (Signed)
PCP and consultation requested by: Lemont Fillers., NP  Subjective:   HPI: Patient is a 55 y.o. female here for right hip and knee pain.  Patient reports she's had pain in right hip pain since about October. Worse with walking. Describes pain at 1/10 level, an ache lateral hip and groin. Feels like muscles are fatigued. Also with right knee pain at 1/10 level, achy and worse at nighttime. Worse also with stairs, with prolonged walking. No skin changes, fever, other complaints.  Past Medical History  Diagnosis Date  . Migraine 1982    pt states migraines since then.   Marland Kitchen Hx of blood clots     fingers  . Carotid artery stenosis   . Hypothyroid   . Hyperlipemia   . PFO (patent foramen ovale)     Current Outpatient Prescriptions on File Prior to Visit  Medication Sig Dispense Refill  . ALPRAZolam (XANAX) 0.5 MG tablet Take 1 tablet (0.5 mg total) by mouth at bedtime as needed for anxiety. 30 tablet 0  . amLODipine (NORVASC) 2.5 MG tablet Take 1 tablet (2.5 mg total) by mouth daily. 90 tablet 0  . atorvastatin (LIPITOR) 20 MG tablet Take 1 tablet (20 mg total) by mouth daily. 30 tablet 5  . Azelastine-Fluticasone 137-50 MCG/ACT SUSP Place 1 spray into both nostrils as needed (allergies).    . gabapentin (NEURONTIN) 100 MG capsule Take 1 capsule (100 mg total) by mouth 3 (three) times daily. 90 capsule 3  . levothyroxine (SYNTHROID, LEVOTHROID) 100 MCG tablet Take 1 tablet (100 mcg total) by mouth daily. 90 tablet 1  . mometasone (NASONEX) 50 MCG/ACT nasal spray Place 2 sprays into the nose daily. 17 g 5  . warfarin (COUMADIN) 10 MG tablet Take 10 mg by mouth daily.     No current facility-administered medications on file prior to visit.    Past Surgical History  Procedure Laterality Date  . Carotid artery - subclavian artery bypass graft Right 2006  . Breast surgery  2000    lumpectomy right    Allergies  Allergen Reactions  . Shellfish Allergy Anaphylaxis  .  Codeine Nausea Only  . Morphine And Related Other (See Comments)    slurring words  . Phenergan [Promethazine] Other (See Comments)    Causes blood presser to bottom out    Social History   Social History  . Marital Status: Married    Spouse Name: N/A  . Number of Children: N/A  . Years of Education: N/A   Occupational History  . Not on file.   Social History Main Topics  . Smoking status: Never Smoker   . Smokeless tobacco: Not on file  . Alcohol Use: 0.0 oz/week    0 Standard drinks or equivalent per week  . Drug Use: No  . Sexual Activity: Not on file   Other Topics Concern  . 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 Heart disease Mother   . Kidney disease Mother   . Heart disease Father   . Diabetes Sister   . Kidney disease Sister   . Heart murmur Brother   . Heart disease Maternal Grandmother  BP 130/84 mmHg  Pulse 65  Ht  (1.702 m)  Wt 198 lb (89.812 kg)  BMI 31.00 kg/m2  Review of Systems: See HPI above.    Objective:  Physical Exam:  Gen: NAD  Right hip: No gross deformity, scoliosis. TTP mildly proximal IT band.  No midline or bony TTP. FROM. Strength LEs 5/5 all muscle groups.   Negative SLRs. Sensation intact to light touch bilaterally. Mild positive logroll Negative fabers and piriformis stretches.  Right knee: No gross deformity, ecchymoses, swelling. No TTP. FROM. Negative ant/post drawers. Negative valgus/varus testing. Negative lachmanns. Negative mcmurrays, apleys, patellar apprehension. NV intact distally.  Left knee: FROM without pain.    Assessment & Plan:  1. Right hip pain - 2/2 DJD though also with evidence of IT band syndrome,  trochanteric bursitis.  Shown home exercises and stretches to do daily.  Discussed tylenol, glucosamine, topical medications.  Consider injection, physical therapy.  Heat/ice, inserts discussed as well.  F/u in 5-6 weeks.  2. Right knee pain - 2/2 DJD.  Discussed tylenol, glucosamine, topical medications.  Consider injection, physical therapy.  Heat/ice, inserts discussed.  F/u in 5-6 weeks.

## 2015-02-28 NOTE — Assessment & Plan Note (Signed)
2/2 DJD though also with evidence of IT band syndrome, trochanteric bursitis.  Shown home exercises and stretches to do daily.  Discussed tylenol, glucosamine, topical medications.  Consider injection, physical therapy.  Heat/ice, inserts discussed as well.  F/u in 5-6 weeks.

## 2015-02-28 NOTE — Assessment & Plan Note (Signed)
2/2 DJD.  Discussed tylenol, glucosamine, topical medications.  Consider injection, physical therapy.  Heat/ice, inserts discussed.  F/u in 5-6 weeks.

## 2015-04-10 ENCOUNTER — Ambulatory Visit: Payer: BLUE CROSS/BLUE SHIELD | Admitting: Family Medicine

## 2015-04-25 ENCOUNTER — Other Ambulatory Visit: Payer: Self-pay | Admitting: Family

## 2015-04-26 MED ORDER — AMLODIPINE BESYLATE 2.5 MG PO TABS: 2.5000 mg | ORAL_TABLET | Freq: Every day | ORAL | Status: DC

## 2015-05-26 ENCOUNTER — Emergency Department (HOSPITAL_BASED_OUTPATIENT_CLINIC_OR_DEPARTMENT_OTHER): Payer: BLUE CROSS/BLUE SHIELD

## 2015-05-26 ENCOUNTER — Encounter (HOSPITAL_BASED_OUTPATIENT_CLINIC_OR_DEPARTMENT_OTHER): Payer: Self-pay | Admitting: *Deleted

## 2015-05-26 ENCOUNTER — Emergency Department (HOSPITAL_BASED_OUTPATIENT_CLINIC_OR_DEPARTMENT_OTHER)
Admission: EM | Admit: 2015-05-26 | Discharge: 2015-05-26 | Disposition: A | Discharge status: Home / Self Care | Payer: BLUE CROSS/BLUE SHIELD | Attending: Emergency Medicine | Admitting: Emergency Medicine

## 2015-05-26 DIAGNOSIS — R079 Chest pain, unspecified: Secondary | ICD-10-CM

## 2015-05-26 DIAGNOSIS — Z7901 Long term (current) use of anticoagulants: Secondary | ICD-10-CM | POA: Diagnosis not present

## 2015-05-26 DIAGNOSIS — Z8679 Personal history of other diseases of the circulatory system: Secondary | ICD-10-CM | POA: Insufficient documentation

## 2015-05-26 DIAGNOSIS — R1013 Epigastric pain: Secondary | ICD-10-CM | POA: Insufficient documentation

## 2015-05-26 DIAGNOSIS — R0789 Other chest pain: Secondary | ICD-10-CM | POA: Insufficient documentation

## 2015-05-26 DIAGNOSIS — Z9889 Other specified postprocedural states: Secondary | ICD-10-CM | POA: Diagnosis not present

## 2015-05-26 DIAGNOSIS — Z86718 Personal history of other venous thrombosis and embolism: Secondary | ICD-10-CM | POA: Insufficient documentation

## 2015-05-26 DIAGNOSIS — E785 Hyperlipidemia, unspecified: Secondary | ICD-10-CM | POA: Insufficient documentation

## 2015-05-26 DIAGNOSIS — E039 Hypothyroidism, unspecified: Secondary | ICD-10-CM | POA: Diagnosis not present

## 2015-05-26 DIAGNOSIS — Z79899 Other long term (current) drug therapy: Secondary | ICD-10-CM | POA: Insufficient documentation

## 2015-05-26 DIAGNOSIS — Z951 Presence of aortocoronary bypass graft: Secondary | ICD-10-CM | POA: Diagnosis not present

## 2015-05-26 DIAGNOSIS — Q211 Atrial septal defect: Secondary | ICD-10-CM | POA: Diagnosis not present

## 2015-05-26 DIAGNOSIS — Z7951 Long term (current) use of inhaled steroids: Secondary | ICD-10-CM | POA: Diagnosis not present

## 2015-05-26 DIAGNOSIS — R5383 Other fatigue: Secondary | ICD-10-CM | POA: Diagnosis not present

## 2015-05-26 LAB — BASIC METABOLIC PANEL
ANION GAP: 11 (ref 5–15)
BUN: 11 mg/dL (ref 6–20)
CO2: 21 mmol/L — ABNORMAL LOW (ref 22–32)
Calcium: 10 mg/dL (ref 8.9–10.3)
Chloride: 106 mmol/L (ref 101–111)
Creatinine, Ser: 0.64 mg/dL (ref 0.44–1.00)
Glucose, Bld: 92 mg/dL (ref 65–99)
POTASSIUM: 3.6 mmol/L (ref 3.5–5.1)
Sodium: 138 mmol/L (ref 135–145)

## 2015-05-26 LAB — CBC
HEMATOCRIT: 38.2 % (ref 36.0–46.0)
Hemoglobin: 13.5 g/dL (ref 12.0–15.0)
MCH: 32.5 pg (ref 26.0–34.0)
MCHC: 35.3 g/dL (ref 30.0–36.0)
MCV: 91.8 fL (ref 78.0–100.0)
Platelets: 256 10*3/uL (ref 150–400)
RBC: 4.16 MIL/uL (ref 3.87–5.11)
RDW: 12.7 % (ref 11.5–15.5)
WBC: 5.4 10*3/uL (ref 4.0–10.5)

## 2015-05-26 LAB — PROTIME-INR
INR: 2.39 — AB (ref 0.00–1.49)
PROTHROMBIN TIME: 25.8 s — AB (ref 11.6–15.2)

## 2015-05-26 LAB — TROPONIN I: Troponin I: <0.03 ng/mL (ref <0.031)

## 2015-05-26 MED ORDER — IOPAMIDOL (ISOVUE-370) INJECTION 76%
100.0000 mL | Freq: Once | INTRAVENOUS | Status: AC | PRN
  Administered 2015-05-26: 100 mL via INTRAVENOUS

## 2015-05-26 NOTE — Discharge Instructions (Signed)
Nonspecific Chest Pain  °Chest pain can be caused by many different conditions. There is always a chance that your pain could be related to something serious, such as a heart attack or a blood clot in your lungs. Chest pain can also be caused by conditions that are not life-threatening. If you have chest pain, it is very important to follow up with your health care provider. °CAUSES  °Chest pain can be caused by: °· Heartburn. °· Pneumonia or bronchitis. °· Anxiety or stress. °· Inflammation around your heart (pericarditis) or lung (pleuritis or pleurisy). °· A blood clot in your lung. °· A collapsed lung (pneumothorax). It can develop suddenly on its own (spontaneous pneumothorax) or from trauma to the chest. °· Shingles infection (varicella-zoster virus). °· Heart attack. °· Damage to the bones, muscles, and cartilage that make up your chest wall. This can include: °¨ Bruised bones due to injury. °¨ Strained muscles or cartilage due to frequent or repeated coughing or overwork. °¨ Fracture to one or more ribs. °¨ Sore cartilage due to inflammation (costochondritis). °RISK FACTORS  °Risk factors for chest pain may include: °· Activities that increase your risk for trauma or injury to your chest. °· Respiratory infections or conditions that cause frequent coughing. °· Medical conditions or overeating that can cause heartburn. °· Heart disease or family history of heart disease. °· Conditions or health behaviors that increase your risk of developing a blood clot. °· Having had chicken pox (varicella zoster). °SIGNS AND SYMPTOMS °Chest pain can feel like: °· Burning or tingling on the surface of your chest or deep in your chest. °· Crushing, pressure, aching, or squeezing pain. °· Dull or sharp pain that is worse when you move, cough, or take a deep breath. °· Pain that is also felt in your back, neck, shoulder, or arm, or pain that spreads to any of these areas. °Your chest pain may come and go, or it may stay  constant. °DIAGNOSIS °Lab tests or other studies may be needed to find the cause of your pain. Your health care provider may have you take a test called an ambulatory ECG (electrocardiogram). An ECG records your heartbeat patterns at the time the test is performed. You may also have other tests, such as: °· Transthoracic echocardiogram (TTE). During echocardiography, sound waves are used to create a picture of all of the heart structures and to look at how blood flows through your heart. °· Transesophageal echocardiogram (TEE). This is a more advanced imaging test that obtains images from inside your body. It allows your health care provider to see your heart in finer detail. °· Cardiac monitoring. This allows your health care provider to monitor your heart rate and rhythm in real time. °· Holter monitor. This is a portable device that records your heartbeat and can help to diagnose abnormal heartbeats. It allows your health care provider to track your heart activity for several days, if needed. °· Stress tests. These can be done through exercise or by taking medicine that makes your heart beat more quickly. °· Blood tests. °· Imaging tests. °TREATMENT  °Your treatment depends on what is causing your chest pain. Treatment may include: °· Medicines. These may include: °¨ Acid blockers for heartburn. °¨ Anti-inflammatory medicine. °¨ Pain medicine for inflammatory conditions. °¨ Antibiotic medicine, if an infection is present. °¨ Medicines to dissolve blood clots. °¨ Medicines to treat coronary artery disease. °· Supportive care for conditions that do not require medicines. This may include: °¨ Resting. °¨ Applying heat   or cold packs to injured areas. °¨ Limiting activities until pain decreases. °HOME CARE INSTRUCTIONS °· If you were prescribed an antibiotic medicine, finish it all even if you start to feel better. °· Avoid any activities that bring on chest pain. °· Do not use any tobacco products, including  cigarettes, chewing tobacco, or electronic cigarettes. If you need help quitting, ask your health care provider. °· Do not drink alcohol. °· Take medicines only as directed by your health care provider. °· Keep all follow-up visits as directed by your health care provider. This is important. This includes any further testing if your chest pain does not go away. °· If heartburn is the cause for your chest pain, you may be told to keep your head raised (elevated) while sleeping. This reduces the chance that acid will go from your stomach into your esophagus. °· Make lifestyle changes as directed by your health care provider. These may include: °¨ Getting regular exercise. Ask your health care provider to suggest some activities that are safe for you. °¨ Eating a heart-healthy diet. A registered dietitian can help you to learn healthy eating options. °¨ Maintaining a healthy weight. °¨ Managing diabetes, if necessary. °¨ Reducing stress. °SEEK MEDICAL CARE IF: °· Your chest pain does not go away after treatment. °· You have a rash with blisters on your chest. °· You have a fever. °SEEK IMMEDIATE MEDICAL CARE IF:  °· Your chest pain is worse. °· You have an increasing cough, or you cough up blood. °· You have severe abdominal pain. °· You have severe weakness. °· You faint. °· You have chills. °· You have sudden, unexplained chest discomfort. °· You have sudden, unexplained discomfort in your arms, back, neck, or jaw. °· You have shortness of breath at any time. °· You suddenly start to sweat, or your skin gets clammy. °· You feel nauseous or you vomit. °· You suddenly feel light-headed or dizzy. °· Your heart begins to beat quickly, or it feels like it is skipping beats. °These symptoms may represent a serious problem that is an emergency. Do not wait to see if the symptoms will go away. Get medical help right away. Call your local emergency services (911 in the [place] Do not drive yourself to the hospital. °  °This  information is not intended to replace advice given to you by your health care provider. Make sure you discuss any questions you have with your health care provider. °  °Document Released: 10/29/2004 Document Revised: 02/09/2014 Document Reviewed: 08/25/2013 °Elsevier Interactive Patient Education ©2016 [employer] ° °

## 2015-05-26 NOTE — ED Provider Notes (Signed)
CSN: [id number]     Arrival date & time 05/26/15  1316 History   First MD Initiated Contact with Patient 05/26/15 1355     Chief Complaint  Patient presents with  . Chest Pain     Patient is a 55 y.o. female presenting with chest pain. The history is provided by the patient.  Chest Pain Pain location:  Substernal area Associated symptoms: fatigue   Associated symptoms: no abdominal pain, no back pain, no headache, no nausea, no numbness, not vomiting and no weakness    Patient presents with chest pain that goes to the back. Began a couple days ago. So she was some fatigue. Patient states that the pain is dull on the front and more sharp in the back. She's had it for the last couple days. No nausea vomiting. No cough. States she has been more fatigued. Has a previous history of carotid  Surgery and PFO with embolisms to fingers. Reportedly had negative heart catheterization also. Has had clots and is on Coumadin. Patient may be hypercoagulable. No diaphoresis. Patient is working at [employer] and has been having more problems the last couple days with fatigue.  Past Medical History  Diagnosis Date  . Migraine 1982    pt states migraines since then.   Marland Kitchen. Hx of blood clots     fingers  . Carotid artery stenosis   . Hypothyroid   . Hyperlipemia   . PFO (patent foramen ovale)    Past Surgical History  Procedure Laterality Date  . Carotid artery - subclavian artery bypass graft Right 2006  . Breast surgery  2000    lumpectomy right   Family History  Problem Relation Age of Onset  . Heart disease Mother   . Kidney disease Mother   . Heart disease Father   . Diabetes Sister   . Kidney disease Sister   . Heart murmur Brother   . Heart disease Maternal Grandmother    Social History  Substance Use Topics  . Smoking status: Never Smoker   . Smokeless tobacco: None  . Alcohol Use: 0.0 oz/week    0 Standard drinks or equivalent per week   OB History    No data available      Review of Systems  Constitutional: Positive for fatigue. Negative for activity change and appetite change.  Eyes: Negative for pain.  Respiratory: Negative for chest tightness.   Cardiovascular: Positive for chest pain. Negative for leg swelling.  Gastrointestinal: Negative for nausea, vomiting, abdominal pain and diarrhea.  Genitourinary: Negative for flank pain.  Musculoskeletal: Negative for back pain and neck stiffness.  Skin: Negative for rash.  Neurological: Negative for weakness, numbness and headaches.  Psychiatric/Behavioral: Negative for behavioral problems.      Allergies  Shellfish allergy; Codeine; Morphine and related; and Phenergan  Home Medications   Prior to Admission medications   Medication Sig Start Date End Date Taking? Authorizing Provider  ALPRAZolam Prudy Feeler(XANAX) 0.5 MG tablet Take 1 tablet (0.5 mg total) by mouth at bedtime as needed for anxiety. 02/20/15   Sandford CrazeMelissa O'Sullivan, NP  amLODipine (NORVASC) 2.5 MG tablet Take 1 tablet (2.5 mg total) by mouth daily. 04/26/15   Sandford CrazeMelissa O'Sullivan, NP  atorvastatin (LIPITOR) 20 MG tablet Take 1 tablet (20 mg total) by mouth daily. 02/20/15   Sandford CrazeMelissa O'Sullivan, NP  Azelastine-Fluticasone 137-50 MCG/ACT SUSP Place 1 spray into both nostrils as needed (allergies).    Historical Provider, MD  levothyroxine (SYNTHROID, LEVOTHROID) 100 MCG tablet Take 1  tablet (100 mcg total) by mouth daily. 02/20/15   [name], NP  mometasone (NASONEX) 50 MCG/ACT nasal spray Place 2 sprays into the nose daily. 05/28/14   [name], NP  warfarin (COUMADIN) 10 MG tablet Take 10 mg by mouth daily.    Historical Provider, MD   BP 128/86 mmHg  Pulse 57  Temp(Src) 98.7 F (37.1 C) (Oral)  Resp 21  Ht  (1.702 m)  Wt 190 lb (86.183 kg)  BMI 29.75 kg/m2  SpO2 93% Physical Exam  Constitutional: She is oriented to person, place, and time. She appears well-developed and well-nourished.  HENT:  Head: Normocephalic and  atraumatic.  Eyes: Pupils are equal, round, and reactive to light.  Neck: Normal range of motion.  Cardiovascular: Normal rate, regular rhythm and normal heart sounds.   No murmur heard. Pulmonary/Chest: Effort normal and breath sounds normal. No respiratory distress. She has no wheezes. She has no rales. She exhibits tenderness.  Tenderness on anterior mid chest wall.  Abdominal: Soft. Bowel sounds are normal. She exhibits no distension. There is no tenderness.  Musculoskeletal: Normal range of motion.  Neurological: She is alert and oriented to person, place, and time. No cranial nerve deficit.  Skin: Skin is warm and dry.  Psychiatric: She has a normal mood and affect. Her speech is normal.  Nursing note and vitals reviewed.   ED Course  Procedures (including critical care time) Labs Review Labs Reviewed  BASIC METABOLIC PANEL - Abnormal; Notable for the following:    CO2 21 (*)    All other components within normal limits  PROTIME-INR - Abnormal; Notable for the following:    Prothrombin Time 25.8 (*)    INR 2.39 (*)    All other components within normal limits  CBC  TROPONIN I    Imaging Review Dg Chest 2 View  05/26/2015  CLINICAL DATA:  Chest pain for 4 days EXAM: CHEST  2 VIEW COMPARISON:  None. FINDINGS: Normal mediastinum and cardiac silhouette. Normal pulmonary vasculature. No evidence of effusion, infiltrate, or pneumothorax. No acute bony abnormality. IMPRESSION: Normal chest radiograph. Electronically Signed   By: Genevive Bi M.D.   On: 05/26/2015 14:24   I have personally reviewed and evaluated these images and lab results as part of my medical decision-making.   EKG Interpretation   Date/Time:  Sunday May 26 2015 13:33:47 EDT Ventricular Rate:  57 PR Interval:  150 QRS Duration: 92 QT Interval:  434 QTC Calculation: 422 R Axis:   13 Text Interpretation:  Sinus bradycardia Nonspecific ST abnormality  Abnormal ECG Confirmed by Rubin Payor  MD, Harrold Donath  [id number]) on 05/26/2015  1:47:45 PM      MDM   Final diagnoses:  Chest pain, unspecified chest pain type   Patient  With mid chest pain going to back. EKG reassuring. Troponin negative and his had the pain for 2 days. She is reportedly hypercoagulable. CTA of the chest abdomen and pelvis was done since the pain goes to the back does have some epigastric pain also. She is therapeutically anticoagulated for previous clots. CTA pending and care turned over to Dr. Radford Pax.    Benjiman Core, MD 05/26/15 [phone]

## 2015-05-26 NOTE — ED Notes (Signed)
Pt reports fatigue since Thursday.  Reports chest pressure with radiation into her back since yesterday morning.  Denies N/V/D.  States that she has been working at [employer] all week.

## 2015-07-23 LAB — HM MAMMOGRAPHY

## 2015-08-07 ENCOUNTER — Encounter: Payer: Self-pay | Admitting: Family

## 2015-08-14 ENCOUNTER — Encounter: Payer: Self-pay | Admitting: Family

## 2015-08-14 MED ORDER — LEVOTHYROXINE SODIUM 100 MCG PO TABS: 100.0000 ug | ORAL_TABLET | Freq: Every day | ORAL | Status: DC

## 2015-08-14 NOTE — Telephone Encounter (Signed)
Rx sent to the pharmacy by e-script.//AB/CMA 

## 2015-09-16 ENCOUNTER — Encounter: Payer: Self-pay | Admitting: Family

## 2015-09-16 MED ORDER — ATORVASTATIN CALCIUM 20 MG PO TABS
20.0000 mg | ORAL_TABLET | Freq: Every day | ORAL | 0 refills | Status: DC
Start: 2015-09-16 — End: 2015-10-04

## 2015-10-04 ENCOUNTER — Ambulatory Visit (INDEPENDENT_AMBULATORY_CARE_PROVIDER_SITE_OTHER): Payer: BLUE CROSS/BLUE SHIELD | Admitting: Family

## 2015-10-04 ENCOUNTER — Encounter: Payer: Self-pay | Admitting: Family

## 2015-10-04 VITALS — BP 134/76 | HR 56 | Temp 98.6°F | Resp 18 | Ht 68.0 in | Wt 201.8 lb

## 2015-10-04 DIAGNOSIS — E785 Hyperlipidemia, unspecified: Secondary | ICD-10-CM | POA: Diagnosis not present

## 2015-10-04 DIAGNOSIS — Z23 Encounter for immunization: Secondary | ICD-10-CM

## 2015-10-04 DIAGNOSIS — F40243 Fear of flying: Secondary | ICD-10-CM | POA: Diagnosis not present

## 2015-10-04 DIAGNOSIS — I10 Essential (primary) hypertension: Secondary | ICD-10-CM

## 2015-10-04 DIAGNOSIS — E039 Hypothyroidism, unspecified: Secondary | ICD-10-CM | POA: Diagnosis not present

## 2015-10-04 LAB — BASIC METABOLIC PANEL
BUN: 10 mg/dL (ref 6–23)
CO2: 30 meq/L (ref 19–32)
Calcium: 9.6 mg/dL (ref 8.4–10.5)
Chloride: 105 mEq/L (ref 96–112)
Creatinine, Ser: 0.75 mg/dL (ref 0.40–1.20)
GFR: 85.32 mL/min (ref >60.00)
GLUCOSE: 94 mg/dL (ref 70–99)
POTASSIUM: 4.7 meq/L (ref 3.5–5.1)
Sodium: 141 mEq/L (ref 135–145)

## 2015-10-04 LAB — TSH: TSH: 1.95 u[IU]/mL (ref 0.35–4.50)

## 2015-10-04 MED ORDER — ALPRAZOLAM 0.5 MG PO TABS: ORAL_TABLET | ORAL | 0 refills | Status: DC

## 2015-10-04 MED ORDER — ATORVASTATIN CALCIUM 20 MG PO TABS: 20.0000 mg | ORAL_TABLET | Freq: Every day | ORAL | 5 refills | Status: DC

## 2015-10-04 NOTE — Assessment & Plan Note (Signed)
rx provided for prn xanax.

## 2015-10-04 NOTE — Assessment & Plan Note (Signed)
Stable on synthroid.  Obtain follow up TSH. 

## 2015-10-04 NOTE — Patient Instructions (Signed)
Please complete lab work prior to leaving.   

## 2015-10-04 NOTE — Progress Notes (Signed)
Pre visit review using our clinic review tool, if applicable. No additional management support is needed unless otherwise documented below in the visit note. 

## 2015-10-04 NOTE — Assessment & Plan Note (Signed)
Stable on atorvastatin, continue same.  

## 2015-10-04 NOTE — Progress Notes (Signed)
Subjective:    Patient ID: Melinda Lopez, female    DOB: 05/17/1960, 55 y.o.   MRN: [id number]  HPI  [name] is a 55 yr old female who presents today for follow up.  1) hyperlipidemia-  Maintained on atorvastatin. Denies myalgia.  Lab Results  Component Value Date   CHOL 134 02/20/2015   HDL 57.30 02/20/2015   LDLCALC 66 02/20/2015   TRIG 55.0 02/20/2015   CHOLHDL 2 02/20/2015   2) Hypothyroid- maintained on synthroid 100mcg.  Lab Results  Component Value Date   TSH 1.74 02/20/2015   3) Anxiety-  Maintained on prn xanax which she only used before flying.   Trying to walk, diet is ok but eats late at night.   Wt Readings from Last 3 Encounters:  10/04/15 201 lb 12.8 oz (91.5 kg)  05/26/15 190 lb (86.2 kg)  02/27/15 198 lb (89.8 kg)    Review of Systems See HPI  Past Medical History:  Diagnosis Date  . Carotid artery stenosis   . Hx of blood clots    fingers  . Hyperlipemia   . Hypothyroid   . Migraine 1982   pt states migraines since then.   Marland Kitchen. PFO (patent foramen ovale)      Social History   Social History  . Marital status: Married    Spouse name: N/A  . Number of children: N/A  . Years of education: N/A   Occupational History  . Not on file.   Social History Main Topics  . Smoking status: Never Smoker  . Smokeless tobacco: Not on file  . Alcohol use 0.0 oz/week  . Drug use: No  . Sexual activity: Not on file   Other Topics Concern  . 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 BREAST SURGERY  2000   lumpectomy right  . CAROTID ARTERY - SUBCLAVIAN ARTERY BYPASS GRAFT Right 2006    Family History  Problem Relation Age of Onset  . Heart disease Mother   .  Kidney disease Mother   . Heart disease Father   . Diabetes Sister   . Kidney disease Sister   . Heart murmur Brother   . Heart disease Maternal Grandmother     Allergies  Allergen Reactions  . Shellfish Allergy Anaphylaxis  . Codeine Nausea Only  . Morphine And Related Other (See Comments)    slurring words  . Phenergan [Promethazine] Other (See Comments)    Causes blood presser to bottom out    Current Outpatient Prescriptions on File Prior to Visit  Medication Sig Dispense Refill  . amLODipine (NORVASC) 2.5 MG tablet Take 1 tablet (2.5 mg total) by mouth daily. 90 tablet 1  . Azelastine-Fluticasone 137-50 MCG/ACT SUSP Place 1 spray into both nostrils as needed (allergies).    Marland Kitchen. levothyroxine (SYNTHROID, LEVOTHROID) 100 MCG tablet Take 1 tablet (100 mcg total) by mouth daily. 90 tablet 1  . mometasone (NASONEX) 50 MCG/ACT nasal spray Place 2 sprays into the nose daily. 17 g 5  . warfarin (COUMADIN) 10 MG tablet Take 10 mg by mouth daily.     No current facility-administered medications on file prior to visit.  BP 134/76 (BP Location: Left Arm, Cuff Size: Normal)   Pulse (!) 56   Temp 98.6 F (37 C) (Oral)   Resp 18   Ht 5\' 8"  (1.727 m)   Wt 201 lb 12.8 oz (91.5 kg)   SpO2 99% Comment: ROOM AIR  BMI 30.68 kg/m       Objective:   Physical Exam  Constitutional: She is oriented to person, place, and time. She appears well-developed and well-nourished.  HENT:  Head: Normocephalic and atraumatic.  Cardiovascular: Normal rate, regular rhythm and normal heart sounds.   No murmur heard. Pulmonary/Chest: Effort normal and breath sounds normal. No respiratory distress. She has no wheezes.  Musculoskeletal: She exhibits no edema.  Neurological: She is alert and oriented to person, place, and time.  Psychiatric: She has a normal mood and affect. Her behavior is normal. Judgment and thought content normal.          Assessment & Plan:

## 2015-10-08 ENCOUNTER — Encounter: Payer: Self-pay | Admitting: Family

## 2015-11-08 ENCOUNTER — Telehealth: Payer: Self-pay | Admitting: *Deleted

## 2015-11-08 MED ORDER — AMLODIPINE BESYLATE 2.5 MG PO TABS: 2.5000 mg | ORAL_TABLET | Freq: Every day | ORAL | 1 refills | Status: DC

## 2015-11-08 MED ORDER — LEVOTHYROXINE SODIUM 100 MCG PO TABS: 100.0000 ug | ORAL_TABLET | Freq: Every day | ORAL | 1 refills | Status: DC

## 2015-11-08 NOTE — Telephone Encounter (Signed)
Received fax from [facility] for: amlodipine 2.5mg , levothyroxine 100mcg, (90 days supply). Refills sent.

## 2015-12-19 ENCOUNTER — Encounter: Payer: Self-pay | Admitting: Family

## 2015-12-19 ENCOUNTER — Other Ambulatory Visit: Payer: Self-pay | Admitting: Family

## 2015-12-19 MED ORDER — AMLODIPINE BESYLATE 2.5 MG PO TABS: 2.5000 mg | ORAL_TABLET | Freq: Every day | ORAL | 0 refills | Status: DC

## 2016-01-28 ENCOUNTER — Other Ambulatory Visit: Payer: Self-pay | Admitting: Family

## 2016-01-28 MED ORDER — LIPITOR 20 MG PO TABS: 20.0000 mg | ORAL_TABLET | Freq: Every day | ORAL | 0 refills | Status: DC

## 2016-02-24 ENCOUNTER — Telehealth: Payer: Self-pay | Admitting: Family

## 2016-02-24 MED ORDER — LIPITOR 20 MG PO TABS: 20.0000 mg | ORAL_TABLET | Freq: Every day | ORAL | 0 refills | Status: DC

## 2016-02-24 NOTE — Telephone Encounter (Signed)
30 day supply of lipitor has been sent to the pharmacy. Pt is past due for fasting physical with Melissa. Please call pt to schedule soon. Thanks!

## 2016-02-25 NOTE — Telephone Encounter (Signed)
lvm for pt making her aware of message.

## 2016-03-26 ENCOUNTER — Other Ambulatory Visit: Payer: Self-pay | Admitting: Family

## 2016-04-06 ENCOUNTER — Encounter: Payer: Self-pay | Admitting: Family

## 2016-04-06 ENCOUNTER — Other Ambulatory Visit: Payer: Self-pay | Admitting: Family

## 2016-04-06 ENCOUNTER — Ambulatory Visit (INDEPENDENT_AMBULATORY_CARE_PROVIDER_SITE_OTHER): Payer: BLUE CROSS/BLUE SHIELD | Admitting: Family

## 2016-04-06 VITALS — BP 134/73 | HR 59 | Temp 98.5°F | Resp 16 | Ht 68.0 in | Wt 201.4 lb

## 2016-04-06 DIAGNOSIS — J0100 Acute maxillary sinusitis, unspecified: Secondary | ICD-10-CM | POA: Diagnosis not present

## 2016-04-06 DIAGNOSIS — E2839 Other primary ovarian failure: Secondary | ICD-10-CM

## 2016-04-06 DIAGNOSIS — Z0000 Encounter for general adult medical examination without abnormal findings: Secondary | ICD-10-CM | POA: Diagnosis not present

## 2016-04-06 LAB — CBC WITH DIFFERENTIAL/PLATELET
BASOS PCT: 0.5 % (ref 0.0–3.0)
Basophils Absolute: 0 10*3/uL (ref 0.0–0.1)
EOS PCT: 1.5 % (ref 0.0–5.0)
Eosinophils Absolute: 0.1 10*3/uL (ref 0.0–0.7)
HCT: 44.1 % (ref 36.0–46.0)
Hemoglobin: 15.2 g/dL — ABNORMAL HIGH (ref 12.0–15.0)
LYMPHS ABS: 1.5 10*3/uL (ref 0.7–4.0)
Lymphocytes Relative: 20.2 % (ref 12.0–46.0)
MCHC: 34.5 g/dL (ref 30.0–36.0)
MCV: 96.1 fl (ref 78.0–100.0)
MONO ABS: 0.5 10*3/uL (ref 0.1–1.0)
Monocytes Relative: 6.2 % (ref 3.0–12.0)
NEUTROS ABS: 5.3 10*3/uL (ref 1.4–7.7)
NEUTROS PCT: 71.6 % (ref 43.0–77.0)
Platelets: 274 10*3/uL (ref 150.0–400.0)
RBC: 4.59 Mil/uL (ref 3.87–5.11)
RDW: 12.8 % (ref 11.5–15.5)
WBC: 7.4 10*3/uL (ref 4.0–10.5)

## 2016-04-06 LAB — HEPATIC FUNCTION PANEL
ALBUMIN: 4.7 g/dL (ref 3.5–5.2)
ALT: 81 U/L — ABNORMAL HIGH (ref 0–35)
AST: 55 U/L — ABNORMAL HIGH (ref 0–37)
Alkaline Phosphatase: 77 U/L (ref 39–117)
BILIRUBIN TOTAL: 0.4 mg/dL (ref 0.2–1.2)
Bilirubin, Direct: 0.1 mg/dL (ref 0.0–0.3)
Total Protein: 7.7 g/dL (ref 6.0–8.3)

## 2016-04-06 LAB — BASIC METABOLIC PANEL
BUN: 11 mg/dL (ref 6–23)
CHLORIDE: 102 meq/L (ref 96–112)
CO2: 29 meq/L (ref 19–32)
Calcium: 10.1 mg/dL (ref 8.4–10.5)
Creatinine, Ser: 0.76 mg/dL (ref 0.40–1.20)
GFR: 83.87 mL/min (ref >60.00)
Glucose, Bld: 103 mg/dL — ABNORMAL HIGH (ref 70–99)
POTASSIUM: 4.2 meq/L (ref 3.5–5.1)
Sodium: 139 mEq/L (ref 135–145)

## 2016-04-06 LAB — TSH: TSH: 2.02 u[IU]/mL (ref 0.35–4.50)

## 2016-04-06 LAB — LIPID PANEL
CHOLESTEROL: 145 mg/dL (ref 0–200)
HDL: 57.7 mg/dL (ref >39.00)
LDL CALC: 74 mg/dL (ref 0–99)
NonHDL: 87.08
TRIGLYCERIDES: 64 mg/dL (ref 0.0–149.0)
Total CHOL/HDL Ratio: 3
VLDL: 12.8 mg/dL (ref 0.0–40.0)

## 2016-04-06 MED ORDER — AMOXICILLIN-POT CLAVULANATE 875-125 MG PO TABS: 1.0000 | ORAL_TABLET | Freq: Two times a day (BID) | ORAL | 0 refills | Status: DC

## 2016-04-06 NOTE — Progress Notes (Signed)
Subjective:    Patient ID: Melinda Lopez, female    DOB: [birth year], 56 y.o.   MRN: [id number]  HPI  Patient presents today for complete physical.  Immunizations: up to date. Diet: reports healthy diet. Has eliminated meats (just fish) Exercise: walks 3.5 miles a week. Trying to keep her calories to 1300/week.  Colonoscopy: 6/13, she will schedule  Dexa: due  Pap Smear: 04/10/14- due for 5 year follow up Mammogram: 07/22/16    [facility]  Tested negative for flu but was given tamiflu. She reports that  It caused HA and nausea so she dicontinued.  She reports + sinus pressure began 5-6 days ago.  She is using mucinex, nasal spray, tylenol. Having some maxillary sinus pain. Reports low grade fever initially.  She had some mild nasal congestion prior to that time.    Review of Systems  Constitutional: Negative for unexpected weight change.  HENT: Negative for hearing loss.   Eyes: Negative for visual disturbance.  Respiratory: Positive for cough. Negative for shortness of breath.   Cardiovascular: Negative for chest pain and leg swelling.  Gastrointestinal: Negative for constipation and diarrhea.  Genitourinary: Negative for dysuria and frequency.  Musculoskeletal: Negative for arthralgias and myalgias.  Neurological: Negative for headaches.  Hematological:       Reports some cervical LAD due to recent illness  Psychiatric/Behavioral:       Denies depression/anxiety   Past Medical History:  Diagnosis Date  . Carotid artery stenosis   . Hx of blood clots    fingers  . Hyperlipemia   . Hypothyroid   . Migraine 1982   pt states migraines since then.   Marland Kitchen PFO (patent foramen ovale)      Social History   Social History  . Marital status: Married    Spouse name: N/A  . Number of children: N/A  . Years of education: N/A   Occupational History  . Not on file.   Social History Main Topics  . Smoking status: Never Smoker  .  Smokeless tobacco: Never Used  . Alcohol use 0.0 oz/week  . Drug use: No  . Sexual activity: Not on file   Other Topics Concern  . 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 BREAST SURGERY  2000   lumpectomy right  . CAROTID ARTERY - SUBCLAVIAN ARTERY BYPASS GRAFT Right 2006    Family History  Problem Relation Age of Onset  . Heart disease Mother   . Kidney disease Mother   . Heart disease Father   . Diabetes Sister   . Kidney disease Sister   . Heart disease Maternal Grandmother   . Heart murmur Brother     Allergies  Allergen Reactions  . Shellfish Allergy Anaphylaxis  . Codeine Nausea Only  . Morphine And Related Other (See Comments)    slurring words  . Phenergan [Promethazine] Other (See Comments)    Causes blood presser to bottom out    Current Outpatient Prescriptions on File Prior to Visit  Medication Sig Dispense Refill  . ALPRAZolam (XANAX) 0.5 MG tablet 1 tablet by mouth prior to flying 20  tablet 0  . amLODipine (NORVASC) 2.5 MG tablet Take 1 tablet (2.5 mg total) by mouth daily. 30 tablet 0  . Azelastine-Fluticasone 137-50 MCG/ACT SUSP Place 1 spray into both nostrils as needed (allergies).    Marland Kitchen. levothyroxine (SYNTHROID, LEVOTHROID) 100 MCG tablet Take 1 tablet (100 mcg total) by mouth daily. 90 tablet 1  . LIPITOR 20 MG tablet TAKE 1 TABLET (20 MG TOTAL) BY MOUTH DAILY. 30 tablet 0  . mometasone (NASONEX) 50 MCG/ACT nasal spray Place 2 sprays into the nose daily. 17 g 5  . warfarin (COUMADIN) 10 MG tablet Take 10 mg by mouth daily.     No current facility-administered medications on file prior to visit.     BP 134/73 (BP Location: Left Arm, Patient Position: Sitting, Cuff Size:  Large)   Pulse (!) 59   Temp 98.5 F (36.9 C) (Oral)   Resp 16   Ht 5\' 8"  (1.727 m)   Wt 201 lb 6.4 oz (91.4 kg)   SpO2 98%   BMI 30.62 kg/m       Objective:   Physical Exam  Physical Exam  Constitutional: She is oriented to person, place, and time. She appears well-developed and well-nourished. No distress.  HENT:  Head: Normocephalic and atraumatic.  Right Ear: Tympanic membrane and ear canal normal.  Left Ear: Tympanic membrane and ear canal normal.  Mouth/Throat: Oropharynx is clear and moist.  Eyes: Pupils are equal, round, and reactive to light. No scleral icterus.  Neck: Normal range of motion. No thyromegaly present.  Cardiovascular: Normal rate and regular rhythm.   No murmur heard. Pulmonary/Chest: Effort normal and breath sounds normal. No respiratory distress. He has no wheezes. She has no rales. She exhibits no tenderness.  Abdominal: Soft. Bowel sounds are normal. She exhibits no distension and no mass. There is no tenderness. There is no rebound and no guarding.  Musculoskeletal: She exhibits no edema.  Lymphadenopathy:    She has no cervical adenopathy.  Neurological: She is alert and oriented to person, place, and time. She has normal patellar reflexes. She exhibits normal muscle tone. Coordination normal.  Skin: Skin is warm and dry.  Psychiatric: She has a normal mood and affect. Her behavior is normal. Judgment and thought content normal.   Breast/pelvis: deferred         Assessment & Plan:         Assessment & Plan:  Preventative Care- immunizations reviewed and up to date. Discussed continued efforts on healthy diet, exercise, weight loss.  Refer for dexa, colo, mammo will be due in 3 months and she will schedule.   Sinusitis-  Probably viral at this point. Continue supportive measures. She is leaving to go out of town to care for her grandchildren and is afraid she may worsen or not improve. Will give her rx for augmentin to bring with her. I  have advised her only to begin if her symptoms are not improved 4-5 days. Pt verbalizes understanding.

## 2016-04-06 NOTE — Patient Instructions (Addendum)
Please complete lab work prior to leaving. Continue to work on [employer] diet, exercise and weight loss. If you sinus symptoms are not improved in 4 days, you may begin augmentin.  You will be contacted about your referral to Dr. Norma Fredricksonoledo and about scheduling your bone density.

## 2016-04-06 NOTE — Progress Notes (Signed)
Pre visit review using our clinic review tool, if applicable. No additional management support is needed unless otherwise documented below in the visit note. 

## 2016-04-06 NOTE — Telephone Encounter (Signed)
Last refill for alprazolam wa son 10/04/2015  #20 with no refills Last office visit was today 04/06/2016

## 2016-04-07 ENCOUNTER — Encounter: Payer: Self-pay | Admitting: Family

## 2016-04-07 DIAGNOSIS — K760 Fatty (change of) liver, not elsewhere classified: Secondary | ICD-10-CM

## 2016-04-07 HISTORY — DX: Fatty (change of) liver, not elsewhere classified: K76.0

## 2016-04-07 NOTE — Telephone Encounter (Signed)
Rx faxed to pharmacy  

## 2016-04-09 NOTE — Addendum Note (Signed)
Addended by: Harley AltoPRICE, Yazmina Pareja M on: 04/09/2016 03:07 PM   Modules accepted: Orders

## 2016-04-23 ENCOUNTER — Ambulatory Visit (HOSPITAL_BASED_OUTPATIENT_CLINIC_OR_DEPARTMENT_OTHER)
Admission: RE | Admit: 2016-04-23 | Discharge: 2016-04-23 | Disposition: A | Discharge status: Home / Self Care | Payer: BLUE CROSS/BLUE SHIELD | Source: Ambulatory Visit | Attending: Family | Admitting: Family

## 2016-04-23 DIAGNOSIS — E2839 Other primary ovarian failure: Secondary | ICD-10-CM

## 2016-04-23 DIAGNOSIS — E039 Hypothyroidism, unspecified: Secondary | ICD-10-CM | POA: Insufficient documentation

## 2016-04-23 DIAGNOSIS — Z13820 Encounter for screening for osteoporosis: Secondary | ICD-10-CM | POA: Insufficient documentation

## 2016-04-23 DIAGNOSIS — Z780 Asymptomatic menopausal state: Secondary | ICD-10-CM | POA: Insufficient documentation

## 2016-04-26 ENCOUNTER — Other Ambulatory Visit: Payer: Self-pay | Admitting: Family

## 2016-04-26 ENCOUNTER — Encounter: Payer: Self-pay | Admitting: Family

## 2016-04-27 MED ORDER — MOMETASONE FUROATE 50 MCG/ACT NA SUSP: 2.0000 | Freq: Every day | NASAL | 5 refills | Status: DC

## 2016-04-27 MED ORDER — ATORVASTATIN CALCIUM 20 MG PO TABS: 20.0000 mg | ORAL_TABLET | Freq: Every day | ORAL | 5 refills | Status: DC

## 2016-04-27 MED ORDER — AMLODIPINE BESYLATE 2.5 MG PO TABS: 2.5000 mg | ORAL_TABLET | Freq: Every day | ORAL | 1 refills | Status: DC

## 2016-05-07 ENCOUNTER — Encounter: Payer: Self-pay | Admitting: Family

## 2016-05-29 ENCOUNTER — Encounter: Payer: Self-pay | Admitting: Family

## 2016-05-29 MED ORDER — EPINEPHRINE 0.3 MG/0.3ML IJ SOAJ: 0.3000 mg | Freq: Once | INTRAMUSCULAR | 3 refills | Status: DC

## 2016-05-29 NOTE — Telephone Encounter (Signed)
Melissa-- I have pended Epi pen order. Please advise?

## 2016-06-04 MED ORDER — EPINEPHRINE 0.3 MG/0.3ML IJ SOAJ: 0.3000 mg | Freq: Once | INTRAMUSCULAR | 3 refills | Status: AC

## 2016-06-04 NOTE — Addendum Note (Signed)
Addended by: Regis BillSCATES, Linard Daft L on: 06/04/2016 11:16 AM   Modules accepted: Orders

## 2016-06-23 ENCOUNTER — Other Ambulatory Visit: Payer: Self-pay | Admitting: Family

## 2016-07-16 ENCOUNTER — Encounter (HOSPITAL_BASED_OUTPATIENT_CLINIC_OR_DEPARTMENT_OTHER): Payer: Self-pay | Admitting: Emergency Medicine

## 2016-07-16 ENCOUNTER — Emergency Department (HOSPITAL_BASED_OUTPATIENT_CLINIC_OR_DEPARTMENT_OTHER)
Admission: EM | Admit: 2016-07-16 | Discharge: 2016-07-16 | Disposition: A | Discharge status: Home / Self Care | Payer: BLUE CROSS/BLUE SHIELD | Attending: Physician Assistant | Admitting: Physician Assistant

## 2016-07-16 ENCOUNTER — Telehealth: Payer: Self-pay | Admitting: Family

## 2016-07-16 ENCOUNTER — Emergency Department (HOSPITAL_BASED_OUTPATIENT_CLINIC_OR_DEPARTMENT_OTHER): Payer: BLUE CROSS/BLUE SHIELD

## 2016-07-16 DIAGNOSIS — R072 Precordial pain: Secondary | ICD-10-CM | POA: Insufficient documentation

## 2016-07-16 DIAGNOSIS — E039 Hypothyroidism, unspecified: Secondary | ICD-10-CM | POA: Diagnosis not present

## 2016-07-16 DIAGNOSIS — R531 Weakness: Secondary | ICD-10-CM | POA: Diagnosis not present

## 2016-07-16 DIAGNOSIS — I10 Essential (primary) hypertension: Secondary | ICD-10-CM | POA: Diagnosis not present

## 2016-07-16 DIAGNOSIS — Z79899 Other long term (current) drug therapy: Secondary | ICD-10-CM | POA: Insufficient documentation

## 2016-07-16 DIAGNOSIS — R29898 Other symptoms and signs involving the musculoskeletal system: Secondary | ICD-10-CM

## 2016-07-16 DIAGNOSIS — R079 Chest pain, unspecified: Secondary | ICD-10-CM | POA: Diagnosis present

## 2016-07-16 HISTORY — DX: Essential (primary) hypertension: I10

## 2016-07-16 LAB — BASIC METABOLIC PANEL
Anion gap: 6 (ref 5–15)
BUN: 9 mg/dL (ref 6–20)
CALCIUM: 9.5 mg/dL (ref 8.9–10.3)
CO2: 29 mmol/L (ref 22–32)
CREATININE: 0.74 mg/dL (ref 0.44–1.00)
Chloride: 105 mmol/L (ref 101–111)
GFR calc Af Amer: >60 mL/min (ref >60)
GLUCOSE: 103 mg/dL — AB (ref 65–99)
Potassium: 3.8 mmol/L (ref 3.5–5.1)
Sodium: 140 mmol/L (ref 135–145)

## 2016-07-16 LAB — CBC
HCT: 39.3 % (ref 36.0–46.0)
HEMOGLOBIN: 13.9 g/dL (ref 12.0–15.0)
MCH: 32.9 pg (ref 26.0–34.0)
MCHC: 35.4 g/dL (ref 30.0–36.0)
MCV: 93.1 fL (ref 78.0–100.0)
Platelets: 238 10*3/uL (ref 150–400)
RBC: 4.22 MIL/uL (ref 3.87–5.11)
RDW: 12.4 % (ref 11.5–15.5)
WBC: 4.8 10*3/uL (ref 4.0–10.5)

## 2016-07-16 LAB — PROTIME-INR
INR: 2.2
PROTHROMBIN TIME: 24.8 s — AB (ref 11.4–15.2)

## 2016-07-16 LAB — TROPONIN I: Troponin I: <0.03 ng/mL (ref <0.03)

## 2016-07-16 LAB — LIPASE, BLOOD: LIPASE: 27 U/L (ref 11–51)

## 2016-07-16 LAB — D-DIMER, QUANTITATIVE: D-Dimer, Quant: <0.27 ug/mL-FEU (ref 0.00–0.50)

## 2016-07-16 NOTE — Telephone Encounter (Signed)
Patient Name: Melinda Lopez  DOB: 03/02/1960    Initial Comment Caller states they have chest pain, on the left side. Left arm feels strained.   Nurse Assessment  Nurse: Sherilyn CooterHenry, RN, Thurmond ButtsWade Date/Time Lamount Cohen(Eastern Time): 07/16/2016 7:15:01 AM  Confirm and document reason for call. If symptomatic, describe symptoms. ---Caller states that she is having left sided chest pain which began 2 days ago that comes and goes. It is happening more frequently. She has been "pretty aggressively getting back into the workout routine." She states that her left arm feels strained. She does have the chest pain at present. She has had it for over 2 hours this morning. She describes the pain as "feels like I have pulled a muscle underneath my left breast." She denies pain in her upper back, neck, and jaw. Denies pain in her left arm, but states that it feels weak.  Does the patient have any new or worsening symptoms? ---Yes  Will a triage be completed? ---Yes  Related visit to physician within the last 2 weeks? ---No  Does the PT have any chronic conditions? (i.e. diabetes, asthma, etc.) ---Yes  List chronic conditions. ---PFO - blood clots with hole in her heart  Is this a behavioral health or substance abuse call? ---No     Guidelines    Guideline Title Affirmed Question Affirmed Notes  Chest Pain [1] Chest pain lasts > 5 minutes AND [2] age > 6850    Final Disposition User   Call EMS 911 Now Sherilyn CooterHenry, RN, Thurmond ButtsWade    Comments  Caller does not agree with calling 911, she just wants to be seen in the office this morning. Advised caller that she has 2 sets of symptoms for me to recommend calling 911; chest pain and weakness in the left arm. She still declines to call 911. At this time, the office is not open per profile. She states that they open at 7am, her and her husband have been in the office before for appointments at 7am. Per profile, the only number listed is the main number, no backline for me to call. At this time, the  call would come back to the office. Advised her that all I can do at this point is to send a message to the office for someone to call her back. She states that is fine, she'll wait for someone to call her back.   Referrals  GO TO FACILITY REFUSED   Disagree/Comply: Disagree  Disagree/Comply Reason: Disagree with instructions

## 2016-07-16 NOTE — Telephone Encounter (Signed)
Patient seen in [facility] ER with precordial chest pain and some vague left arm weakness that is not reproducible on exam in ER. Received call from Dr Corlis LeakMackuen who reports her cardiac work up and CT of head were negative for any acute concerns. After discussion with patient it was agreed she will continue her coumadin and see us in quick follow up within next week to consider possible MRI of brain if symptoms are persisient. If symptoms worsen she will seek care again. They will treat her pain as probable costochondritis.

## 2016-07-16 NOTE — ED Notes (Signed)
Lab notified do add on blood work to tubes in lab

## 2016-07-16 NOTE — ED Triage Notes (Signed)
L side chest pain x 2 days. Pt reports recent change in diet, eating cleaner. Pt reports L arm weakness this morning. Denies N/V, SOB, back pain.

## 2016-07-16 NOTE — Telephone Encounter (Signed)
Noted and agree. Thank you.

## 2016-07-16 NOTE — Telephone Encounter (Signed)
Follow up call made to patient after receiving call from [facility] nurse Thurmond ButtsWade this morning. Patients answering mache came on. Left message for return call regarding her symptoms.

## 2016-07-16 NOTE — Discharge Instructions (Signed)
Return if you have worsening of the current left arm weakness, new weakness elsewhere, numbness in an extremity or face, facial droop, severe headaches, vision changes, changes in speech, or confusion.   Follow up with your PCP regarding scheduling an outpatient MRI.

## 2016-07-16 NOTE — Telephone Encounter (Signed)
Please contact patient to arrange ER follow up appointment

## 2016-07-16 NOTE — Telephone Encounter (Signed)
agree

## 2016-07-16 NOTE — ED Notes (Signed)
Back from xray

## 2016-07-16 NOTE — Telephone Encounter (Addendum)
Spoke to patient, pt states for the past two days she has been experiencing mild left sided chest pain (located under breast) That radiates to left side of back.  Rated 2 or 3 on 0-10 pain scale, but pt states she has a high tolerance for pain.  She is attributing pain to working out (walking; no heavy lifting) and diet changes.  She thought she experiencing indigestion, so she took zantac.  It did not help.  Pt now thinks it may be a pulled muscle.  She is on coumadin, so she has avoided taking tylenol or ibuprofen.  She's calling today, because she continues to experience chest pain, and is now experiencing  left arm weakness (which is new for patient).  She is alert and orient. Speech clear.  She denies shortness of breath, n/v, abdominal pain, blurry vision.    Given pt's report and history of a blood clot, hyperlipidemia, and HTN, she was advised to go to ER for evaluation. Pt agreed stating she would come to the [facility].  Her husband will be driving her here.    Message routed to Sandford CrazeMelissa O'Sullivan (pt's PCP) and Dr. Zola ButtonLowne-Chase (covering provider) for FYI.

## 2016-07-16 NOTE — ED Provider Notes (Signed)
[facility] Provider Note   CSN: [id number] Arrival date & time: 07/16/16  0848     History   Chief Complaint Chief Complaint  Patient presents with  . Chest Pain    HPI Melinda Lopez is a 56 y.o. female.  HPI   56 year old female with PMH of carotid artery stenosis, history of blood clots with PFO and ?hypercoagulable state on Coumadin, HTN, and HLD presents with 2 day history of left sided chest pain. Pain started suddenly and describes pain as an ache/soreness. Pain has been constant since starting. Pain radiates very mildly to her back. Was concerned that the pain may be related to a pulled muscle as she has recently increased physical activity. Also considered that it may be related to heartburn as she has recently changed her diet to completely vegetarian over the past two weeks. She started taking Zantac two days ago without improvement in her symptoms.   She reports that she has new sensation of left arm weakness this morning. Left arm feels heavy. She denies weakness elsewhere in the body. No loss of sensation.   She has been compliant with Coumadin and has not missed any doses. Does note that only recently was her INR in therapeutic range.    Past Medical History:  Diagnosis Date  . Carotid artery stenosis   . Fatty liver 04/07/2016  . High cholesterol   . Hx of blood clots    fingers  . Hyperlipemia   . Hypertension   . Hypothyroid   . Migraine 1982   pt states migraines since then.   Marland Kitchen PFO (patent foramen ovale)     Patient Active Problem List   Diagnosis Date Noted  . 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  . Right hip pain 02/28/2015  . Right knee pain 02/28/2015  . Long term current use of anticoagulant 02/25/2015  . Hypercoagulable state (HCC) 02/25/2015  . Fatty liver 05/29/2014  . Preventative health care 04/12/2014  . History of blood clots 03/15/2014  . Patent foramen ovale 03/15/2014  . History of carotid artery stenosis 03/15/2014  .  Migraines 03/15/2014  . Hypothyroidism 03/15/2014  . Hyperlipidemia 03/15/2014    Past Surgical History:  Procedure Laterality Date  . BREAST SURGERY  2000   lumpectomy right  . CAROTID ARTERY - SUBCLAVIAN ARTERY BYPASS GRAFT Right 2006    OB History    No data available       Home Medications    Prior to Admission medications   Medication Sig Start Date End Date Taking? Authorizing Provider  ALPRAZolam Prudy Feeler) 0.5 MG tablet TAKE ONE TABLET BY MOUTH PRIOR TO FLYING 04/07/16  Yes [name], NP  amLODipine (NORVASC) 2.5 MG tablet Take 1 tablet (2.5 mg total) by mouth daily. 04/27/16  Yes [name], NP  atorvastatin (LIPITOR) 20 MG tablet Take 1 tablet (20 mg total) by mouth daily. 04/27/16  Yes [name], NP  Azelastine-Fluticasone 137-50 MCG/ACT SUSP Place 1 spray into both nostrils as needed (allergies).   Yes [provider]  levothyroxine (SYNTHROID, LEVOTHROID) 100 MCG tablet TAKE 1 TABLET BY MOUTH DAILY 06/24/16  Yes [name], NP  warfarin (COUMADIN) 10 MG tablet Take 10 mg by mouth daily.   Yes [provider]  mometasone (NASONEX) 50 MCG/ACT nasal spray Place 2 sprays into the nose daily. 04/27/16   [name], NP    Family History Family History  Problem Relation Age of Onset  . Heart disease Mother   .  Kidney disease Mother   . Heart disease Father   . Diabetes Sister   . Kidney disease Sister   . Heart disease Maternal Grandmother   . Heart murmur Brother     Social History Social History  Substance Use Topics  . Smoking status: Never Smoker  . Smokeless tobacco: Never Used  . Alcohol use 0.0 oz/week     Allergies   Shellfish allergy; Codeine; Morphine and related; and Phenergan [promethazine]   Review of Systems Review of Systems  Constitutional: Negative for chills and fever.  HENT: Negative for congestion and rhinorrhea.   Respiratory: Negative for cough, chest tightness, shortness of  breath and wheezing.        No hemoptysis.   Cardiovascular: Positive for chest pain. Negative for palpitations and leg swelling.  Gastrointestinal: Negative for abdominal pain, nausea and vomiting.  Neurological: Negative for facial asymmetry, speech difficulty, numbness and headaches.  Psychiatric/Behavioral: Negative for confusion.     Physical Exam Updated Vital Signs BP 140/89 (BP Location: Left Arm)   Pulse (!) 59   Temp 97.5 F (36.4 C) (Oral)   Resp 16   Ht 5\' 7"  (1.702 m)   Wt 89.8 kg (198 lb)   SpO2 97%   BMI 31.01 kg/m   Physical Exam  Constitutional: She is oriented to person, place, and time. She appears well-developed and well-nourished. No distress.  HENT:  Head: Normocephalic and atraumatic.  Mouth/Throat: Oropharynx is clear and moist.  Eyes: Conjunctivae and EOM are normal. Pupils are equal, round, and reactive to light.  Neck: Normal range of motion. Neck supple.  Cardiovascular: Normal rate, regular rhythm and normal heart sounds.   No murmur heard. Pulmonary/Chest: Effort normal and breath sounds normal. No respiratory distress. She has no wheezes.  Abdominal: Soft. Bowel sounds are normal. She exhibits no distension. There is no tenderness.  Musculoskeletal: Normal range of motion. She exhibits no edema.  No calf TTP.   Neurological: She is alert and oriented to person, place, and time. No cranial nerve deficit or sensory deficit.  Strength 5/5 in all extremities. Clear speech.   Skin: Skin is warm and dry. She is not diaphoretic.  Psychiatric: She has a normal mood and affect. Her behavior is normal.     ED Treatments / Results  Labs (all labs ordered are listed, but only abnormal results are displayed) Labs Reviewed  BASIC METABOLIC PANEL - Abnormal; Notable for the following:       Result Value   Glucose, Bld 103 (*)    All other components within normal limits  PROTIME-INR - Abnormal; Notable for the following:    Prothrombin Time 24.8 (*)     All other components within normal limits  CBC  TROPONIN I  D-DIMER, QUANTITATIVE (NOT AT [facility])  LIPASE, BLOOD  TROPONIN I    EKG  EKG Interpretation  Date/Time:  Thursday July 16 2016 08:59:00 EDT Ventricular Rate:  56 PR Interval:    QRS Duration: 100 QT Interval:  445 QTC Calculation: 430 R Axis:   5 Text Interpretation:  Sinus rhythm Baseline wander in lead(s) V3 V6 Normal sinus rhythm No significant change since last tracing Confirmed by Corlis LeakMackuen, Courteney ([id number]) on 07/16/2016 10:09:07 AM       Radiology Dg Chest 2 View  Result Date: 07/16/2016 CLINICAL DATA:  Left-sided chest pain with left arm weakness for the past 2 days. Former smoker. History of a hypercoagulable state. Nonsmoker. EXAM: CHEST  2 VIEW COMPARISON:  Chest x-ray  of May 26, 2015 and chest CT scan of the same day. FINDINGS: The lungs are well-expanded and clear. The heart and pulmonary vascularity are normal. The mediastinum is normal in width. There is no pleural effusion. The bony thorax exhibits no acute abnormality. IMPRESSION: There is no acute cardiopulmonary abnormality. Electronically Signed   By: David  [place] M.D.   On: 07/16/2016 09:12   Ct Head Wo Contrast  Result Date: 07/16/2016 CLINICAL DATA:  Left arm weakness EXAM: CT HEAD WITHOUT CONTRAST TECHNIQUE: Contiguous axial images were obtained from the base of the skull through the vertex without intravenous contrast. COMPARISON:  09/25/2012 FINDINGS: Brain: Normal appearance of the brain. No evidence of acute infarction, hemorrhage, hydrocephalus, extra-axial collection or mass lesion/mass effect. Vascular: Mild siphon calcification.  No hyperdense vessel. Skull: No acute or aggressive finding Sinuses/Orbits: Negative. IMPRESSION: Normal appearance of the brain. Electronically Signed   By: Marnee Spring M.D.   On: 07/16/2016 10:20    Procedures Procedures (including critical care time)  Medications Ordered in ED Medications - No data to  display   Initial Impression / Assessment and Plan / ED Course  I have reviewed the triage vital signs and the nursing notes.  Pertinent labs & imaging results that were available during my care of the patient were reviewed by me and considered in my medical decision making (see chart for details).    56 year old female PMH of carotid artery stenosis, history of blood clots with PFO and ?hypercoagulable state on Coumadin, HTN, and HLD who presents with two days of constant left sided chest pain and recent onset of perception of left arm weakness. CXR without acute cardiopulmonary changes. Chest pain has been constant for two days so less likely to be ACS. HEART score of 2, low risk. Reassured by EKG without acute ST segment changes and negative troponin x2. CBC and BMET unremarkable. Lipase WNL. INR within therapeutic range. Given history of hypercoagulable state, concern for Pulmonary Embolus at etiology of her chest pain. Well's score of 3 for probable diagnosis of PE. D-dimer obtained and was negative. Elected to stop work up and not proceed with CTA chest given negative d-dimer, stable vital signs, and lack of other symptoms concerning for PE (hemoptysis, SOB, signs./symptoms of DVT). Suspect chest pain is likely musculoskeletal in nature given benign work up and report of changes in exercise habits/sensation that pain is similar to that of a pulled muscle.   Neuro exam benign. Given report of left arm weakness, obtained CT head. CT head negative. Patient centered discussion regarding option of transferring to outside facility for MRI brain now vs. Close outpatient f/u with plan for outpatient MRI. Patient elected to have further work up done outpatient. Return precautions such as numbness, speech changes, facial droop, confusion etc discussed. Perceived weakness may be due to irritation of nerve root to left UE secondary to costochondritis.   Final Clinical Impressions(s) / ED Diagnoses   Final  diagnoses:  Precordial chest pain  Weakness of left arm    New Prescriptions New Prescriptions   No medications on file     Arvilla Market, DO 07/16/16 1352    Abelino Derrick, MD 07/17/16 [phone]

## 2016-07-24 NOTE — Telephone Encounter (Signed)
I called patient to get her schedule, patient stated her grandchildren are in town and she will need to check her schedule to see when she can come. She will call back to schedule her ER follow up

## 2016-08-26 DIAGNOSIS — Z9889 Other specified postprocedural states: Secondary | ICD-10-CM | POA: Insufficient documentation

## 2016-08-26 DIAGNOSIS — D689 Coagulation defect, unspecified: Secondary | ICD-10-CM | POA: Insufficient documentation

## 2016-08-26 DIAGNOSIS — G459 Transient cerebral ischemic attack, unspecified: Secondary | ICD-10-CM | POA: Insufficient documentation

## 2016-09-24 DIAGNOSIS — I6523 Occlusion and stenosis of bilateral carotid arteries: Secondary | ICD-10-CM | POA: Insufficient documentation

## 2016-10-02 ENCOUNTER — Other Ambulatory Visit: Payer: Self-pay | Admitting: Family

## 2016-10-06 MED ORDER — LEVOTHYROXINE SODIUM 100 MCG PO TABS: 100.0000 ug | ORAL_TABLET | Freq: Every day | ORAL | 0 refills | Status: DC

## 2016-10-19 ENCOUNTER — Other Ambulatory Visit: Payer: Self-pay | Admitting: Family

## 2016-10-19 NOTE — Telephone Encounter (Signed)
Denied with note to pharm stating "Pt will need to verify that she wants this Rx through your pharmacy" as we have 2 mail order pharms on file and this was sent to local pharm last time/thx dmf

## 2016-10-20 ENCOUNTER — Other Ambulatory Visit: Payer: Self-pay | Admitting: Family

## 2016-10-26 ENCOUNTER — Telehealth: Payer: Self-pay | Admitting: *Deleted

## 2016-10-26 MED ORDER — AMLODIPINE BESYLATE 2.5 MG PO TABS: 2.5000 mg | ORAL_TABLET | Freq: Every day | ORAL | 0 refills | Status: DC

## 2016-10-26 NOTE — Telephone Encounter (Signed)
Received fax from [facility], [place] requesting refill of amlodipine 2.5mg . Refill sent and letter has been mailed to pt reminding of need for follow up now and to verify if she is using any mailorder service presently. It doesn't appear that pt has responded to previous mychart messages.

## 2016-11-04 ENCOUNTER — Encounter: Payer: Self-pay | Admitting: Family

## 2016-11-04 ENCOUNTER — Other Ambulatory Visit: Payer: Self-pay | Admitting: Family

## 2016-11-04 MED ORDER — NITROFURANTOIN MONOHYD MACRO 100 MG PO CAPS: 100.0000 mg | ORAL_CAPSULE | Freq: Two times a day (BID) | ORAL | 0 refills | Status: DC

## 2017-01-12 ENCOUNTER — Other Ambulatory Visit: Payer: Self-pay | Admitting: Family

## 2017-01-19 ENCOUNTER — Ambulatory Visit (INDEPENDENT_AMBULATORY_CARE_PROVIDER_SITE_OTHER): Payer: BLUE CROSS/BLUE SHIELD | Admitting: Family

## 2017-01-19 ENCOUNTER — Encounter: Payer: Self-pay | Admitting: Family

## 2017-01-19 VITALS — BP 133/74 | HR 57 | Temp 98.2°F | Resp 18 | Ht 68.0 in | Wt 202.8 lb

## 2017-01-19 DIAGNOSIS — Z86010 Personal history of colonic polyps: Secondary | ICD-10-CM

## 2017-01-19 DIAGNOSIS — Z8679 Personal history of other diseases of the circulatory system: Secondary | ICD-10-CM

## 2017-01-19 DIAGNOSIS — D6859 Other primary thrombophilia: Secondary | ICD-10-CM

## 2017-01-19 DIAGNOSIS — E785 Hyperlipidemia, unspecified: Secondary | ICD-10-CM

## 2017-01-19 DIAGNOSIS — E039 Hypothyroidism, unspecified: Secondary | ICD-10-CM

## 2017-01-19 MED ORDER — LEVOTHYROXINE SODIUM 100 MCG PO TABS: 100.0000 ug | ORAL_TABLET | Freq: Every day | ORAL | 1 refills | Status: DC

## 2017-01-19 MED ORDER — ATORVASTATIN CALCIUM 20 MG PO TABS: ORAL_TABLET | ORAL | 1 refills | Status: DC

## 2017-01-19 NOTE — Assessment & Plan Note (Signed)
Stable on synthroid, obtain follow up A1C.

## 2017-01-19 NOTE — Assessment & Plan Note (Signed)
Maintained on coumadin which is managed by coumadin clinic.

## 2017-01-19 NOTE — Progress Notes (Signed)
Subjective:    Patient ID: Melinda Lopez, female    DOB: 03/05/1960, 56 y.o.   MRN: [id number]  HPI  Melinda Lopez is a 56 yr old female who presents today for follow up.  1) Hypothyroid- maintained on synthroid 100mcg daily.  Lab Results  Component Value Date   TSH 2.02 04/06/2016   2) Hyperlipidemia- maintained on lipitor 20mg . Denies myalgia.  Lab Results  Component Value Date   CHOL 145 04/06/2016   HDL 57.70 04/06/2016   LDLCALC 74 04/06/2016   TRIG 64.0 04/06/2016   CHOLHDL 3 04/06/2016   3) hx of clot- maintained on coumadin which is monitored by the coumadin clinic.  4) Carotid stenosis- Sees Dr. Izora Ribasoley and will follow back up n 2 years. Was told stable. Reviewed OV in Care everywhere.   Notes that her stomach feels "tender" across the upper abdomen. Symptoms are mild.  Normal bowel habits. Thinks it is related to weight gain.      Review of Systems    see HPI  Past Medical History:  Diagnosis Date  . Carotid artery stenosis   . Fatty liver 04/07/2016  . High cholesterol   . Hx of blood clots    fingers  . Hyperlipemia   . Hypertension   . Hypothyroid   . Migraine 1982   pt states migraines since then.   Marland Kitchen. PFO (patent foramen ovale)      Social History   Socioeconomic History  . Marital status: Married    Spouse name: Not on file  . Number of children: Not on file  . Years of education: Not on file  . Highest education level: Not on file  Social Needs  . Financial resource strain: Not on file  . Food insecurity - worry: Not on file  . Food insecurity - inability: Not on file  . Transportation needs - medical: Not on file  . Transportation needs - non-medical: Not on file  Occupational History  . Not on file  Tobacco Use  . Smoking status: Never Smoker  . Smokeless tobacco: Never Used  Substance and Sexual Activity  . Alcohol use: Yes    Alcohol/week: 0.0 oz  . Drug use: No  . Sexual activity: Not on file  Other Topics Concern  . 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 BREAST SURGERY  2000   lumpectomy right  . CAROTID ARTERY - SUBCLAVIAN ARTERY BYPASS GRAFT Right 2006    Family History  Problem Relation Age of Onset  . Heart disease Mother   . Kidney disease Mother   . Heart disease Father   . Diabetes Sister   . Kidney disease Sister   . Heart disease Maternal Grandmother   . Heart murmur Brother     Allergies  Allergen Reactions  . Shellfish Allergy Anaphylaxis  . Codeine Nausea Only  . Morphine And Related Other (See Comments)    slurring words  . Phenergan [Promethazine] Other (See Comments)    Causes blood presser to bottom out    Current Outpatient Medications on File Prior to Visit  Medication Sig Dispense Refill  . ALPRAZolam (XANAX) 0.5 MG tablet TAKE ONE TABLET BY  MOUTH PRIOR TO FLYING 20 tablet 0  . amLODipine (NORVASC) 2.5 MG tablet TAKE 1 TABLET BY MOUTH DAILY 90 tablet 1  . Azelastine-Fluticasone 137-50 MCG/ACT SUSP Place 1 spray into both nostrils as needed (allergies).    Marland Kitchen. levothyroxine (SYNTHROID, LEVOTHROID) 100 MCG tablet TAKE 1 TABLET BY MOUTH DAILY 90 tablet 0  . LIPITOR 20 MG tablet TAKE 1 TABLET (20 MG TOTAL) BY MOUTH DAILY. 30 tablet 5  . mometasone (NASONEX) 50 MCG/ACT nasal spray Place 2 sprays into the nose daily. 17 g 5  . warfarin (COUMADIN) 10 MG tablet Take 10 mg by mouth daily.     No current facility-administered medications on file prior to visit.     BP 133/74 (BP Location: Right Arm, Cuff Size: Large)   Pulse (!) 57   Temp 98.2 F (36.8 C) (Oral)   Resp 18   Ht 5\' 8"  (1.727 m)   Wt 202 lb 12.8 oz (92 kg)   SpO2 96%   BMI 30.84 kg/m    Objective:   Physical Exam  Constitutional: She  is oriented to person, place, and time. She appears well-developed and well-nourished.  HENT:  Mild ptergyium noted right near inner canthus  Cardiovascular: Normal rate, regular rhythm and normal heart sounds.  No murmur heard. Pulmonary/Chest: Effort normal and breath sounds normal. No respiratory distress. She has no wheezes.  Abdominal: Soft. She exhibits no distension and no mass. There is no tenderness. There is no rebound and no guarding.  Neurological: She is alert and oriented to person, place, and time.  Psychiatric: She has a normal mood and affect. Her behavior is normal. Judgment and thought content normal.          Assessment & Plan:  Normal abdominal exam- pt is advised to call if new/worsening symptoms.

## 2017-01-19 NOTE — Patient Instructions (Signed)
Please follow up as scheduled.

## 2017-01-19 NOTE — Assessment & Plan Note (Signed)
Stable managed by vascular.

## 2017-01-19 NOTE — Assessment & Plan Note (Signed)
Stable on lipitor, continue same.  

## 2017-02-19 ENCOUNTER — Encounter: Payer: Self-pay | Admitting: Family

## 2017-06-11 ENCOUNTER — Telehealth: Payer: Self-pay | Admitting: Internal Medicine

## 2017-06-11 DIAGNOSIS — D6861 Antiphospholipid syndrome: Secondary | ICD-10-CM

## 2017-06-11 DIAGNOSIS — Z86010 Personal history of colonic polyps: Secondary | ICD-10-CM

## 2017-06-11 NOTE — Telephone Encounter (Signed)
We have received a referral from pt's PCP for a repeat colon. Colon report and path received from previous GI MD. Patient is requesting Dr. Leone Payor. Records sent to Dr. Leone Payor for review.

## 2017-06-15 ENCOUNTER — Other Ambulatory Visit (HOSPITAL_COMMUNITY)
Admission: RE | Admit: 2017-06-15 | Discharge: 2017-06-15 | Disposition: A | Discharge status: Home / Self Care | Payer: BLUE CROSS/BLUE SHIELD | Source: Ambulatory Visit | Attending: Family | Admitting: Family

## 2017-06-15 ENCOUNTER — Ambulatory Visit (INDEPENDENT_AMBULATORY_CARE_PROVIDER_SITE_OTHER): Payer: BLUE CROSS/BLUE SHIELD | Admitting: Family

## 2017-06-15 ENCOUNTER — Encounter: Payer: Self-pay | Admitting: Family

## 2017-06-15 VITALS — BP 144/90 | HR 53 | Temp 98.4°F | Resp 16 | Ht 67.0 in | Wt 199.2 lb

## 2017-06-15 DIAGNOSIS — Z0000 Encounter for general adult medical examination without abnormal findings: Secondary | ICD-10-CM

## 2017-06-15 DIAGNOSIS — Z01419 Encounter for gynecological examination (general) (routine) without abnormal findings: Secondary | ICD-10-CM | POA: Insufficient documentation

## 2017-06-15 DIAGNOSIS — R110 Nausea: Secondary | ICD-10-CM | POA: Diagnosis not present

## 2017-06-15 LAB — URINALYSIS, ROUTINE W REFLEX MICROSCOPIC
Bilirubin Urine: NEGATIVE
HGB URINE DIPSTICK: NEGATIVE
Ketones, ur: NEGATIVE
Leukocytes, UA: NEGATIVE
Nitrite: NEGATIVE
RBC / HPF: NONE SEEN (ref 0–?)
Specific Gravity, Urine: 1.01 (ref 1.000–1.030)
Total Protein, Urine: NEGATIVE
Urine Glucose: NEGATIVE
Urobilinogen, UA: 0.2 (ref 0.0–1.0)
pH: 7 (ref 5.0–8.0)

## 2017-06-15 LAB — HEPATIC FUNCTION PANEL
ALT: 53 U/L — AB (ref 0–35)
AST: 48 U/L — AB (ref 0–37)
Albumin: 4.5 g/dL (ref 3.5–5.2)
Alkaline Phosphatase: 66 U/L (ref 39–117)
Bilirubin, Direct: 0.1 mg/dL (ref 0.0–0.3)
Total Bilirubin: 0.5 mg/dL (ref 0.2–1.2)
Total Protein: 7.3 g/dL (ref 6.0–8.3)

## 2017-06-15 LAB — BASIC METABOLIC PANEL
BUN: 10 mg/dL (ref 6–23)
CHLORIDE: 105 meq/L (ref 96–112)
CO2: 25 mEq/L (ref 19–32)
Calcium: 10 mg/dL (ref 8.4–10.5)
Creatinine, Ser: 0.71 mg/dL (ref 0.40–1.20)
GFR: 90.33 mL/min (ref >60.00)
Glucose, Bld: 95 mg/dL (ref 70–99)
Potassium: 3.8 mEq/L (ref 3.5–5.1)
SODIUM: 144 meq/L (ref 135–145)

## 2017-06-15 LAB — CBC WITH DIFFERENTIAL/PLATELET
BASOS PCT: 0.4 % (ref 0.0–3.0)
Basophils Absolute: 0 10*3/uL (ref 0.0–0.1)
EOS PCT: 1.3 % (ref 0.0–5.0)
Eosinophils Absolute: 0.1 10*3/uL (ref 0.0–0.7)
HEMATOCRIT: 41.2 % (ref 36.0–46.0)
HEMOGLOBIN: 14.3 g/dL (ref 12.0–15.0)
Lymphocytes Relative: 28.9 % (ref 12.0–46.0)
Lymphs Abs: 1.7 10*3/uL (ref 0.7–4.0)
MCHC: 34.7 g/dL (ref 30.0–36.0)
MCV: 95.1 fl (ref 78.0–100.0)
MONOS PCT: 6 % (ref 3.0–12.0)
Monocytes Absolute: 0.4 10*3/uL (ref 0.1–1.0)
Neutro Abs: 3.8 10*3/uL (ref 1.4–7.7)
Neutrophils Relative %: 63.4 % (ref 43.0–77.0)
Platelets: 274 10*3/uL (ref 150.0–400.0)
RBC: 4.33 Mil/uL (ref 3.87–5.11)
RDW: 13.1 % (ref 11.5–15.5)
WBC: 6 10*3/uL (ref 4.0–10.5)

## 2017-06-15 LAB — LIPID PANEL
CHOLESTEROL: 150 mg/dL (ref 0–200)
HDL: 64.8 mg/dL (ref >39.00)
LDL CALC: 66 mg/dL (ref 0–99)
NonHDL: 84.87
TRIGLYCERIDES: 95 mg/dL (ref 0.0–149.0)
Total CHOL/HDL Ratio: 2
VLDL: 19 mg/dL (ref 0.0–40.0)

## 2017-06-15 LAB — TSH: TSH: 0.81 u[IU]/mL (ref 0.35–4.50)

## 2017-06-15 MED ORDER — OMEPRAZOLE 40 MG PO CPDR: 40.0000 mg | DELAYED_RELEASE_CAPSULE | Freq: Every day | ORAL | 3 refills | Status: DC

## 2017-06-15 MED ORDER — MOMETASONE FUROATE 50 MCG/ACT NA SUSP: 2.0000 | Freq: Every day | NASAL | 5 refills | Status: DC

## 2017-06-15 NOTE — Patient Instructions (Signed)
Please complete lab work prior to leaving. Begin omeprazole.  

## 2017-06-15 NOTE — Progress Notes (Signed)
Subjective:    Patient ID: Melinda Lopez, female    DOB: Mar 18, 1960, 57 y.o.   MRN: [id number]  HPI    Patient presents today for complete physical.  Immunizations: td 2012 Diet: diet is good but eats late at night and travels for work.  Exercise:  Walks 4 or more days a week, for 1 hour Colonoscopy: 2013- due 2012 Dexa: 2018 Pap Smear: due Mammogram: 7/18, scheduled for 7/19 Dental: due Vision:  Last year Wt Readings from Last 3 Encounters:  06/15/17 199 lb 3.2 oz (90.4 kg)  01/19/17 202 lb 12.8 oz (92 kg)  07/16/16 198 lb (89.8 kg)   She reports chronic nausea, denies RUQ pain, see ROS.     Review of Systems  Constitutional: Negative for unexpected weight change.  HENT: Negative for hearing loss and rhinorrhea.   Eyes: Negative for visual disturbance.  Respiratory: Negative for cough.   Cardiovascular: Negative for leg swelling.  Gastrointestinal: Negative for blood in stool, diarrhea and nausea.       Occasional nausea, eating improves nausea, sometimes nausea post prandial  Genitourinary: Negative for dysuria, frequency and hematuria.  Musculoskeletal: Negative for arthralgias and myalgias.  Neurological:       One migraine/month, typically resolve with tylenol, sleep, darkness  Hematological: Negative for adenopathy.  Psychiatric/Behavioral:       Denies depression/anxiety   Past Medical History:  Diagnosis Date  . Carotid artery stenosis   . Fatty liver 04/07/2016  . High cholesterol   . Hx of blood clots    fingers  . Hyperlipemia   . Hypertension   . Hypothyroid   . Migraine 1982   pt states migraines since then.   Marland Kitchen PFO (patent foramen ovale)      Social History   Socioeconomic History  . Marital status: Married    Spouse name: Not on file  . Number of children: Not on file  . Years of education: Not on file  . Highest education level: Not on file  Occupational History  . Not on file  Social Needs  . Financial resource strain: Not on  file  . Food insecurity:    Worry: Not on file    Inability: Not on file  . Transportation needs:    Medical: Not on file    Non-medical: Not on file  Tobacco Use  . Smoking status: Never Smoker  . Smokeless tobacco: Never Used  Substance and Sexual Activity  . Alcohol use: Yes    Alcohol/week: 0.0 oz  . Drug use: No  . Sexual activity: Not on file  Lifestyle  . Physical activity:    Days per week: Not on file    Minutes per session: Not on file  . Stress: Not on file  Relationships  . Social connections:    Talks on phone: Not on file    Gets together: Not on file    Attends religious service: Not on file    Active member of club or organization: Not on file    Attends meetings of clubs or organizations: Not on file    Relationship status: Not on file  . Intimate partner violence:    Fear of current or ex partner: Not on file    Emotionally abused: Not on file    Physically abused: Not on file    Forced sexual activity: Not on file  Other Topics Concern  . 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 BREAST SURGERY  2000   lumpectomy right  . CAROTID ARTERY - SUBCLAVIAN ARTERY BYPASS GRAFT Right 2006    Family History  Problem Relation Age of Onset  . Heart disease Mother   . Kidney disease Mother   . Heart disease Father   . Diabetes Sister   . Kidney disease Sister   . Heart disease Maternal Grandmother   . Heart murmur Brother     Allergies  Allergen Reactions  . Shellfish Allergy Anaphylaxis  . Codeine Nausea Only  . Morphine And Related Other (See Comments)    slurring words  . Phenergan [Promethazine] Other (See Comments)    Causes blood presser to bottom out    Current  Outpatient Medications on File Prior to Visit  Medication Sig Dispense Refill  . ALPRAZolam (XANAX) 0.5 MG tablet TAKE ONE TABLET BY MOUTH PRIOR TO FLYING 20 tablet 0  . amLODipine (NORVASC) 2.5 MG tablet TAKE 1 TABLET BY MOUTH DAILY 90 tablet 1  . atorvastatin (LIPITOR) 20 MG tablet TAKE 1 TABLET (20 MG TOTAL) BY MOUTH DAILY. 90 tablet 1  . Azelastine-Fluticasone 137-50 MCG/ACT SUSP Place 1 spray into both nostrils as needed (allergies).    . EPINEPHrine 0.3 mg/0.3 mL IJ SOAJ injection Use as needed for severe allergic reaction  3  . levothyroxine (SYNTHROID, LEVOTHROID) 100 MCG tablet Take 1 tablet (100 mcg total) by mouth daily. 90 tablet 1  . warfarin (COUMADIN) 10 MG tablet Take 10 mg by mouth daily.     No current facility-administered medications on file prior to visit.     Pulse (!) 53   Temp 98.4 F (36.9 C) (Oral)   Resp 16   Ht  (1.702 m)   Wt 199 lb 3.2 oz (90.4 kg)   SpO2 99%   BMI 31.20 kg/m       Objective:   Physical Exam  Physical Exam  Constitutional: She is oriented to person, place, and time. She appears well-developed and well-nourished. No distress.  HENT:  Head: Normocephalic and atraumatic.  Right Ear: Tympanic membrane and ear canal normal.  Left Ear: Tympanic membrane and ear canal normal.  Mouth/Throat: Oropharynx is clear and moist.  Eyes: Pupils are equal, round, and reactive to light. No scleral icterus.  Neck: Normal range of motion. No thyromegaly present.  Cardiovascular: Normal rate and regular rhythm.   No murmur heard. Pulmonary/Chest: Effort normal and breath sounds normal. No respiratory distress. He has no wheezes. She has no rales. She exhibits no tenderness.  Abdominal: Soft. Bowel sounds are normal. She exhibits no distension and no mass. There is no tenderness. There is no rebound and no guarding.  Musculoskeletal: She exhibits no edema.  Lymphadenopathy:    She has no cervical adenopathy.  Neurological: She is alert and  oriented to person, place, and time. She has normal patellar reflexes. She exhibits normal muscle tone. Coordination normal.  Skin: Skin is warm and dry.  Psychiatric: She has a normal mood and affect. Her behavior is normal. Judgment and thought content normal.  Breasts: Examined lying Right: Without masses, retractions, discharge or axillary adenopathy.  Left: Without masses, retractions, discharge or axillary adenopathy.  Inguinal/mons: Normal without inguinal adenopathy  External genitalia:  Normal  BUS/Urethra/Skene's glands: Normal  Bladder: Normal  Vagina: Normal  Cervix: Normal  Uterus: normal in size, shape and contour. Midline and mobile  Adnexa/parametria:  Rt: Without masses or tenderness.  Lt: Without masses or tenderness.  Anus and perineum: Normal            Assessment & Plan:   Preventative Care- discussed diet/exercise/weight loss. She plans to cut down on her wine consumption (1-2 glasses each night). Pap performed.   Chronic nausea- ? GERD/Gastritis, ? H pylori. Check h pylori breath test.  Trial of omeprazole. Follow up in 1 month. If not improved consider check abd [place].       Assessment & Plan:  EKG tracing is personally reviewed.  EKG notes NSR.  No acute changes.

## 2017-06-16 ENCOUNTER — Encounter: Payer: Self-pay | Admitting: Internal Medicine

## 2017-06-16 DIAGNOSIS — D6861 Antiphospholipid syndrome: Secondary | ICD-10-CM | POA: Insufficient documentation

## 2017-06-16 DIAGNOSIS — D6862 Lupus anticoagulant syndrome: Secondary | ICD-10-CM | POA: Insufficient documentation

## 2017-06-16 NOTE — Telephone Encounter (Signed)
4 mm adenoma removed 2013  On warfarin  Needs APP visit and colonoscopy w/ me (or visit me if available)

## 2017-06-16 NOTE — Telephone Encounter (Signed)
Left message to call back to schedule.

## 2017-06-17 LAB — CYTOLOGY - PAP
DIAGNOSIS: NEGATIVE
HPV: NOT DETECTED

## 2017-07-01 ENCOUNTER — Other Ambulatory Visit: Payer: Self-pay | Admitting: Family

## 2017-07-14 ENCOUNTER — Encounter: Payer: Self-pay | Admitting: Physician Assistant

## 2017-07-28 ENCOUNTER — Encounter: Payer: Self-pay | Admitting: Physician Assistant

## 2017-07-28 ENCOUNTER — Ambulatory Visit (INDEPENDENT_AMBULATORY_CARE_PROVIDER_SITE_OTHER): Payer: BLUE CROSS/BLUE SHIELD | Admitting: Physician Assistant

## 2017-07-28 VITALS — BP 132/84 | HR 60 | Ht 66.0 in | Wt 200.4 lb

## 2017-07-28 DIAGNOSIS — Z86010 Personal history of colonic polyps: Secondary | ICD-10-CM | POA: Diagnosis not present

## 2017-07-28 DIAGNOSIS — Z1211 Encounter for screening for malignant neoplasm of colon: Secondary | ICD-10-CM | POA: Diagnosis not present

## 2017-07-28 DIAGNOSIS — Z7901 Long term (current) use of anticoagulants: Secondary | ICD-10-CM | POA: Diagnosis not present

## 2017-07-28 NOTE — Patient Instructions (Signed)
IIf you are age 57 or younger, your body mass index should be between 19-25. Your Body mass index is 32.34 kg/m. If this is out of the aformentioned range listed, please consider follow up with your Primary Care Provider.   You have been scheduled for a colonoscopy. Please follow written instructions given to you at your visit today.  Please pick up your prep supplies at the pharmacy within the next 1-3 days. If you use inhalers (even only as needed), please bring them with you on the day of your procedure. Your physician has requested that you go to www.startemmi.com and enter the access code given to you at your visit today. This web site gives a general overview about your procedure. However, you should still follow specific instructions given to you by our office regarding your preparation for the procedure.  We will call you with the Coumadin clearance instructions from the Coumadin clinic, Cameron AliSandy Davis.  They will also call you about the Lovenox bridge.

## 2017-07-28 NOTE — Progress Notes (Addendum)
Subjective:    Patient ID: Melinda Lopez, female    DOB: Jan 29, 1961, 56 y.o.   MRN: [id number]  HPI Melinda Lopez is a pleasant 57 year old white female, new to GI today referred by Melinda Lopez, to Dr. Leone Lopez for follow-up colonoscopy. Patient had EGD and colonoscopy done in June 2013 by Dr. Sibyl Lopez and was found to have one 4 mm tubular adenomatous colon polyp and small internal hemorrhoids.  She was indicated for 5-year interval follow-up. EGD was negative with exception of 2 cm hiatal hernia.  Biopsies were taken to rule out H. pylori and these were negative. Patient has history of antiphospholipid syndrome, hypercoagulable state and is on chronic Coumadin.  She has a PFO, has had prior subclavian bypass, history of migraines, autoimmune issues and hypothyroidism. She is followed by the Coumadin clinic at [facility] under the care of her hematologist Dr. Roslyn Lopez.  She is aware that she will require a Lovenox bridge to safely come off Coumadin for colonoscopy. Patient has no current lower GI complaints, no rectal bleeding, melena, changes in bowel habits, or significant constipation.  She says that her abdomen is always sensitive or tender and has been that way for a long time.  She thinks that it may be related to weight gain. She does have history of GERD but has not required any regular PPI therapy for a long time.  She uses Tums as needed. Patient mentions that she is very sensitive to medications, she is allergic to Phenergan. She was concerned about what type of anesthesia we use, she did receive Demerol and Versed with her last colonoscopy and did fine with that.  Review of Systems Pertinent positive and negative review of systems were noted in the above HPI section.  All other review of systems was otherwise negative.  Outpatient Encounter Medications as of 07/28/2017  Medication Sig  . ALPRAZolam (XANAX) 0.5 MG tablet TAKE ONE TABLET BY MOUTH  PRIOR TO FLYING  . amLODipine (NORVASC) 2.5 MG tablet TAKE 1 TABLET BY MOUTH DAILY  . atorvastatin (LIPITOR) 20 MG tablet TAKE 1 TABLET BY MOUTH DAILY  . Azelastine-Fluticasone 137-50 MCG/ACT SUSP Place 1 spray into both nostrils as needed (allergies).  . EPINEPHrine 0.3 mg/0.3 mL IJ SOAJ injection Use as needed for severe allergic reaction  . levothyroxine (SYNTHROID, LEVOTHROID) 100 MCG tablet TAKE 1 TABLET BY MOUTH DAILY  . mometasone (NASONEX) 50 MCG/ACT nasal spray Place 2 sprays into the nose daily.  Marland Kitchen omeprazole (PRILOSEC) 40 MG capsule Take 1 capsule (40 mg total) by mouth daily.  Marland Kitchen warfarin (COUMADIN) 10 MG tablet Take 10 mg by mouth daily.   No facility-administered encounter medications on file as of 07/28/2017.    Allergies  Allergen Reactions  . Shellfish Allergy Anaphylaxis  . Codeine Nausea Only  . Morphine And Related Other (See Comments)    slurring words  . Phenergan [Promethazine] Other (See Comments)    Causes blood presser to bottom out  . Sulfa Antibiotics Rash   Patient Active Problem List   Diagnosis Date Noted  . Anti-phospholipid antibody syndrome (HCC) 06/16/2017  . 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  . Right hip pain 02/28/2015  . Right knee pain 02/28/2015  . Long term current use of anticoagulant 02/25/2015  . Hypercoagulable state (HCC) 02/25/2015  . Fatty liver 05/29/2014  . Preventative health care 04/12/2014  . History of blood clots 03/15/2014  . Patent foramen ovale 03/15/2014  . History of carotid artery stenosis 03/15/2014  . Migraines  03/15/2014  . Hypothyroidism 03/15/2014  . Hyperlipidemia 03/15/2014  . Hx of adenomatous polyp of colon 07/30/2011   Social History   Socioeconomic History  . Marital status: Married    Spouse name: Not on file  . Number of children: 2  . Years of education: Not on file  . Highest education level: Not on file  Occupational History  . Not on file  Social Needs  . Financial resource strain: Not on file  .  Food insecurity:    Worry: Not on file    Inability: Not on file  . Transportation needs:    Medical: Not on file    Non-medical: Not on file  Tobacco Use  . Smoking status: Former Smoker    Types: Cigarettes    Last attempt to quit: 07/29/2002    Years since quitting: 15.0  . Smokeless tobacco: Never Used  Substance and Sexual Activity  . Alcohol use: Yes    Alcohol/week: 0.0 oz    Comment: 2 per day  . Drug use: No  . Sexual activity: Not on file  Lifestyle  . Physical activity:    Days per week: Not on file    Minutes per session: Not on file  . Stress: Not on file  Relationships  . Social connections:    Talks on phone: Not on file    Gets together: Not on file    Attends religious service: Not on file    Active member of club or organization: Not on file    Attends meetings of clubs or organizations: Not on file    Relationship status: Not on file  . Intimate partner violence:    Fear of current or ex partner: Not on file    Emotionally abused: Not on file    Physically abused: Not on file    Forced sexual activity: Not on file  Other Topics Concern  . 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's family history includes Diabetes in her sister; Heart disease in her father, maternal grandmother, and mother; Heart murmur in her brother; Kidney disease in her mother and sister.      Objective:    Vitals:   07/28/17 1102  BP: 132/84  Pulse: 60    Physical Exam; well-developed white female in no acute distress, pleasant blood pressure 132/84 pulse 60, height 5 foot 6, weight 200, BMI 32.3.  HEENT; nontraumatic normocephalic EOMI PERRLA sclera anicteric, Oropharynx clear, surgical scar right neck.  Cardiovascular ;regular rate and rhythm  with S1-S2, Pulmonary; clear bilaterally, Abdomen; soft, there is mild tenderness with very light palpation generally no guarding or rebound no mass or hepatosplenomegaly bowel sounds are present, Rectal ;exam not done, Extremities ;no clubbing cyanosis or edema skin warm and dry, Neuro psych ;alert and oriented, grossly nonfocal mood and affect appropriate       Assessment & Plan:   #9 57 year old white female, on chronic Coumadin with history of hypercoagulable state, antiphospholipid syndrome who is referred today for follow-up colonoscopy with history of adenomatous colon polyp on colonoscopy June 2013/done by Dr. Sibyl Lopez.  Patient is currently asymptomatic She has required Lovenox bridge when coming off Coumadin in the past.  #2 hypothyroidism #3.  History  of migraines #4.  Status post subclavian bypass/right carotid stenosis #5 patent foramen ovale  Plan; Patient will be scheduled for colonoscopy with Dr. Leone PayorGessner.  Procedure was discussed in detail with patient including indications risks and benefits and she is agreeable to proceed.  Will need to hold Coumadin for 5 days prior to colonoscopy and patient will need a Lovenox bridge.  We [facility]  Coumadin is managed by her hematologist.    Sammuel CooperAmy S Eilam Shrewsbury PA-C 07/28/2017   Cc: Melinda Lopez'Sullivan, Melissa, NP  Agree with Ms. Oswald HillockEsterwood's assessment and plan. Iva Booparl E. Gessner, MD, Clementeen GrahamFACG

## 2017-08-10 ENCOUNTER — Telehealth: Payer: Self-pay | Admitting: *Deleted

## 2017-08-10 NOTE — Telephone Encounter (Signed)
Advised the patient that I heard from Cameron AliSandy Davis at the Coumadin Clinic the patient goes to.  She is to stop the Coumadin on 8-22 through 8-27, the day of the colonoscopy. I advised Dr. Leone PayorGessner will telll her when to resume the Coumadin, most likely the next day.  I also advised Cameron AliSandy Davis will be call her about the Lovenox injections.  She verbally understood the directions.

## 2017-09-15 ENCOUNTER — Encounter: Payer: Self-pay | Admitting: Internal Medicine

## 2017-09-28 ENCOUNTER — Ambulatory Visit (AMBULATORY_SURGERY_CENTER): Payer: BLUE CROSS/BLUE SHIELD | Admitting: Internal Medicine

## 2017-09-28 ENCOUNTER — Encounter: Payer: Self-pay | Admitting: Internal Medicine

## 2017-09-28 VITALS — BP 112/62 | HR 54 | Temp 98.6°F | Resp 23 | Ht 66.0 in | Wt 200.0 lb

## 2017-09-28 DIAGNOSIS — Z1211 Encounter for screening for malignant neoplasm of colon: Secondary | ICD-10-CM

## 2017-09-28 DIAGNOSIS — Z86010 Personal history of colonic polyps: Secondary | ICD-10-CM | POA: Diagnosis not present

## 2017-09-28 MED ORDER — SODIUM CHLORIDE 0.9 % IV SOLN: 500.0000 mL | INTRAVENOUS | Status: DC

## 2017-09-28 NOTE — Progress Notes (Signed)
Report to PACU, RN, vss, BBS= Clear.  

## 2017-09-28 NOTE — Op Note (Signed)
[facility] Patient Name: Melinda Lopez Procedure Date: 09/28/2017 2:48 PM MRN: [id number] Endoscopist: Iva Boop , MD Age: 57 Referring MD:  Date of Birth: 05-23-1960 Gender: Female Account #: [id number] Procedure:                Colonoscopy Indications:              Surveillance: Personal history of adenomatous                            polyps on last colonoscopy > 5 years ago, Last                            colonoscopy: 2013 Medicines:                Propofol per Anesthesia, Monitored Anesthesia Care Procedure:                Pre-Anesthesia Assessment:                           - Prior to the procedure, a History and Physical                            was performed, and patient medications and                            allergies were reviewed. The patient's tolerance of                            previous anesthesia was also reviewed. The risks                            and benefits of the procedure and the sedation                            options and risks were discussed with the patient.                            All questions were answered, and informed consent                            was obtained. Prior Anticoagulants: The patient                            last took Coumadin (warfarin) 5 days and Lovenox                            (enoxaparin) 1 day prior to the procedure. ASA                            Grade Assessment: III - A patient with severe                            systemic disease. After reviewing the risks and  benefits, the patient was deemed in satisfactory                            condition to undergo the procedure.                           After obtaining informed consent, the colonoscope                            was passed under direct vision. Throughout the                            procedure, the patient's blood pressure, pulse, and                            oxygen saturations were monitored  continuously. The                            Colonoscope was introduced through the anus and                            advanced to the the cecum, identified by                            appendiceal orifice and ileocecal valve. The                            colonoscopy was performed without difficulty. The                            patient tolerated the procedure well. The quality                            of the bowel preparation was good. The ileocecal                            valve, appendiceal orifice, and rectum were                            photographed. The bowel preparation used was                            Miralax. Scope In: 3:03:11 PM Scope Out: 3:15:22 PM Scope Withdrawal Time: 0 hours 7 minutes 44 seconds  Total Procedure Duration: 0 hours 12 minutes 11 seconds  Findings:                 The perianal and digital rectal examinations were                            normal.                           The entire examined colon appeared normal on direct  and retroflexion views. Complications:            No immediate complications. Estimated Blood Loss:     Estimated blood loss: none. Impression:               - The entire examined colon is normal on direct and                            retroflexion views.                           - No specimens collected.                           - Personal history of colonic polyp 4 mm adenoma                            2013. Recommendation:           - Patient has a contact number available for                            emergencies. The signs and symptoms of potential                            delayed complications were discussed with the                            patient. Return to normal activities tomorrow.                            Written discharge instructions were provided to the                            patient.                           - Resume previous diet.                           -  Continue present medications.                           - Resume Coumadin (warfarin) today and Lovenox                            (enoxaparin) tomorrow at prior doses. Refer to                            Coumadin Clinic for further adjustment of therapy.                           - Repeat colonoscopy in 10 years. Iva Boop, MD 09/28/2017 3:21:47 PM This report has been signed electronically.

## 2017-09-28 NOTE — Patient Instructions (Addendum)
No polyps seen today. Next routine colonoscopy  test in 10 years - 2029  Restart warfarin tonight and enoxaparin tomorrow as directed by the anti-coagulation clinic.  I appreciate the opportunity to care for you. Iva Booparl E. Katura Eatherly, MD, FACG  YOU HAD AN ENDOSCOPIC PROCEDURE TODAY AT THE [facility]:   Refer to the procedure report that was given to you for any specific questions about what was found during the examination.  If the procedure report does not answer your questions, please call your gastroenterologist to clarify.  If you requested that your care partner not be given the details of your procedure findings, then the procedure report has been included in a sealed envelope for you to review at your convenience later.  YOU SHOULD EXPECT: Some feelings of bloating in the abdomen. Passage of more gas than usual.  Walking can help get rid of the air that was put into your GI tract during the procedure and reduce the bloating. If you had a lower endoscopy (such as a colonoscopy or flexible sigmoidoscopy) you may notice spotting of blood in your stool or on the toilet paper. If you underwent a bowel prep for your procedure, you may not have a normal bowel movement for a few days.  Please Note:  You might notice some irritation and congestion in your nose or some drainage.  This is from the oxygen used during your procedure.  There is no need for concern and it should clear up in a day or so.  SYMPTOMS TO REPORT IMMEDIATELY:   Following lower endoscopy (colonoscopy or flexible sigmoidoscopy):  Excessive amounts of blood in the stool  Significant tenderness or worsening of abdominal pains  Swelling of the abdomen that is new, acute  Fever of 100F or higher   For urgent or emergent issues, a gastroenterologist can be reached at any hour by calling [phone].   DIET:  We do recommend a small meal at first, but then you may proceed to your regular diet.  Drink plenty  of fluids but you should avoid alcoholic beverages for 24 hours.  ACTIVITY:  You should plan to take it easy for the rest of today and you should NOT DRIVE or use heavy machinery until tomorrow (because of the sedation medicines used during the test).    FOLLOW UP: Our staff will call the number listed on your records the next business day following your procedure to check on you and address any questions or concerns that you may have regarding the information given to you following your procedure. If we do not reach you, we will leave a message.  However, if you are feeling well and you are not experiencing any problems, there is no need to return our call.  We will assume that you have returned to your regular daily activities without incident.  If any biopsies were taken you will be contacted by phone or by letter within the next 1-3 weeks.  Please call us at [phone] if you have not heard about the biopsies in 3 weeks.    SIGNATURES/CONFIDENTIALITY: You and/or your care partner have signed paperwork which will be entered into your electronic medical record.  These signatures attest to the fact that that the information above on your After Visit Summary has been reviewed and is understood.  Full responsibility of the confidentiality of this discharge information lies with you and/or your care-partner.  Resume Coumadin( warfarin ) today and Lovenox tomorrow  Refer to Coumadin  Clinic for further adjustment of therapy.  Repeat colonoscopy in 10 years-2029.

## 2017-09-29 ENCOUNTER — Telehealth: Payer: Self-pay

## 2017-09-29 NOTE — Telephone Encounter (Signed)
  Follow up Call-  Call back number 09/28/2017  Post procedure Call Back phone  # [phone]  Permission to leave phone message Yes  Some recent data might be hidden     Patient questions:  Do you have a fever, pain , or abdominal swelling? No. Pain Score  0 *  Have you tolerated food without any problems? Yes.    Have you been able to return to your normal activities? Yes.    Do you have any questions about your discharge instructions: Diet   No. Medications  No. Follow up visit  No.  Do you have questions or concerns about your Care? No.  Actions: * If pain score is 4 or above: No action needed, pain <4.

## 2017-09-30 ENCOUNTER — Other Ambulatory Visit: Payer: Self-pay | Admitting: Family

## 2017-09-30 MED ORDER — LEVOTHYROXINE SODIUM 100 MCG PO TABS: 100.0000 ug | ORAL_TABLET | Freq: Every day | ORAL | 1 refills | Status: DC

## 2017-10-01 NOTE — Telephone Encounter (Signed)
90 day supply of amlodipine and atorvastatin sent to pharmacy. Pt is due for follow up with Melissa. Mychart message sent.

## 2017-12-06 ENCOUNTER — Ambulatory Visit: Payer: BLUE CROSS/BLUE SHIELD | Admitting: Family

## 2017-12-08 ENCOUNTER — Ambulatory Visit (INDEPENDENT_AMBULATORY_CARE_PROVIDER_SITE_OTHER): Payer: BLUE CROSS/BLUE SHIELD | Admitting: Family

## 2017-12-08 ENCOUNTER — Encounter: Payer: Self-pay | Admitting: Family

## 2017-12-08 VITALS — BP 126/74 | HR 61 | Temp 98.4°F | Resp 18 | Wt 202.2 lb

## 2017-12-08 DIAGNOSIS — E039 Hypothyroidism, unspecified: Secondary | ICD-10-CM | POA: Diagnosis not present

## 2017-12-08 DIAGNOSIS — G43909 Migraine, unspecified, not intractable, without status migrainosus: Secondary | ICD-10-CM

## 2017-12-08 DIAGNOSIS — F418 Other specified anxiety disorders: Secondary | ICD-10-CM

## 2017-12-08 DIAGNOSIS — E785 Hyperlipidemia, unspecified: Secondary | ICD-10-CM

## 2017-12-08 DIAGNOSIS — L659 Nonscarring hair loss, unspecified: Secondary | ICD-10-CM | POA: Diagnosis not present

## 2017-12-08 DIAGNOSIS — M5432 Sciatica, left side: Secondary | ICD-10-CM

## 2017-12-08 DIAGNOSIS — Z23 Encounter for immunization: Secondary | ICD-10-CM

## 2017-12-08 DIAGNOSIS — D6859 Other primary thrombophilia: Secondary | ICD-10-CM | POA: Diagnosis not present

## 2017-12-08 DIAGNOSIS — I10 Essential (primary) hypertension: Secondary | ICD-10-CM

## 2017-12-08 LAB — CBC WITH DIFFERENTIAL/PLATELET
BASOS PCT: 0.4 % (ref 0.0–3.0)
Basophils Absolute: 0 10*3/uL (ref 0.0–0.1)
EOS PCT: 1.2 % (ref 0.0–5.0)
Eosinophils Absolute: 0.1 10*3/uL (ref 0.0–0.7)
HEMATOCRIT: 43.1 % (ref 36.0–46.0)
Hemoglobin: 14.6 g/dL (ref 12.0–15.0)
LYMPHS ABS: 2.2 10*3/uL (ref 0.7–4.0)
LYMPHS PCT: 32.8 % (ref 12.0–46.0)
MCHC: 33.9 g/dL (ref 30.0–36.0)
MCV: 95.9 fl (ref 78.0–100.0)
MONOS PCT: 8.1 % (ref 3.0–12.0)
Monocytes Absolute: 0.5 10*3/uL (ref 0.1–1.0)
NEUTROS ABS: 3.8 10*3/uL (ref 1.4–7.7)
NEUTROS PCT: 57.5 % (ref 43.0–77.0)
PLATELETS: 310 10*3/uL (ref 150.0–400.0)
RBC: 4.5 Mil/uL (ref 3.87–5.11)
RDW: 13.1 % (ref 11.5–15.5)
WBC: 6.7 10*3/uL (ref 4.0–10.5)

## 2017-12-08 LAB — TSH: TSH: 3.32 u[IU]/mL (ref 0.35–4.50)

## 2017-12-08 MED ORDER — PROPRANOLOL HCL 40 MG PO TABS: ORAL_TABLET | ORAL | 0 refills | Status: DC

## 2017-12-08 MED ORDER — METHYLPREDNISOLONE 4 MG PO TBPK: ORAL_TABLET | ORAL | 0 refills | Status: DC

## 2017-12-08 MED ORDER — SUMATRIPTAN SUCCINATE 50 MG PO TABS: ORAL_TABLET | ORAL | 0 refills | Status: DC

## 2017-12-08 NOTE — Patient Instructions (Signed)
Please complete lab work prior to leaving. You may use imitrex as needed at start of migraine. 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 For sciatica- start medrol dose pak. Call if symptoms worsen or if they do not improve.

## 2017-12-08 NOTE — Progress Notes (Signed)
Subjective:    Patient ID: Melinda Lopez, female    DOB: 09/22/1960, 57 y.o.   MRN: [id number]  HPI  Patient is a 57 year old female who presents today for routine follow-up.  Hypertension- she is maintained on amlodipine 2.5 mg once daily.  BP Readings from Last 3 Encounters:  12/08/17 126/74  09/28/17 112/62  07/28/17 132/84   Hyperlipidemia- maintained on atorvastatin 20 mg once daily. Lab Results  Component Value Date   CHOL 150 06/15/2017   HDL 64.80 06/15/2017   LDLCALC 66 06/15/2017   TRIG 95.0 06/15/2017   CHOLHDL 2 06/15/2017    Hypothyroid- she is maintained on Synthroid 100 mcg once daily. Lab Results  Component Value Date   TSH 0.81 06/15/2017   She has history of a hypercoagulable state secondary to lupus anticoagulant.  She is followed by hematology at [facility].  She last saw them back in June.  They recommended that she continue Coumadin at this time.  Reports that she will need to make a public presentation and has extreme anxiety about public speaking.    Notes that she has had more frequent migraines recently.  Reports that she had 1 migraine 3 weeks in a row. This occurs while traveling.    Reports some hair loss.  Notes some generalized thinning all over.   Notes some pain radiating down the left leg for leg which has been x 1 month. Reports pain is constant but certain movements worsen it such as standing.      Review of Systems See HPI  Past Medical History:  Diagnosis Date  . Carotid artery stenosis   . Chronic headaches   . Fatty liver 04/07/2016  . Hx of adenomatous polyp of colon 07/30/2011  . Hx of blood clots    fingers  . Hyperlipemia   . Hypertension   . Hypothyroidism   . Migraine 1982   pt states migraines since then.   Marland Kitchen PFO (patent foramen ovale)   . TIA (transient ischemic attack)      Social History   Socioeconomic History  . Marital status: Married    Spouse name: Not on file  . Number of children: 2  .  Years of education: Not on file  . Highest education level: Not on file  Occupational History  . Not on file  Social Needs  . Financial resource strain: Not on file  . Food insecurity:    Worry: Not on file    Inability: Not on file  . Transportation needs:    Medical: Not on file    Non-medical: Not on file  Tobacco Use  . Smoking status: Former Smoker    Types: Cigarettes    Last attempt to quit: 07/29/2002    Years since quitting: 15.3  . Smokeless tobacco: Never Used  Substance and Sexual Activity  . Alcohol use: Yes    Alcohol/week: 0.0 standard drinks    Comment: 2 per day  . Drug use: No  . Sexual activity: Not on file  Lifestyle  . Physical activity:    Days per week: Not on file    Minutes per session: Not on file  . Stress: Not on file  Relationships  . Social connections:    Talks on phone: Not on file    Gets together: Not on file    Attends religious service: Not on file    Active member of club or organization: Not on file    Attends meetings of  clubs or organizations: Not on file    Relationship status: Not on file  . Intimate partner violence:    Fear of current or ex partner: Not on file    Emotionally abused: Not on file    Physically abused: Not on file    Forced sexual activity: Not on file  Other Topics Concern  . 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 BREAST LUMPECTOMY Right 2000  . CAROTID ARTERY - SUBCLAVIAN ARTERY BYPASS GRAFT Right 2006  . COLONOSCOPY    . UPPER GASTROINTESTINAL ENDOSCOPY      Family History  Problem Relation Age of Onset  . Heart disease Mother   . Kidney disease Mother   . Heart disease Father   . Diabetes Sister   . Kidney disease Sister     . Heart disease Maternal Grandmother   . Heart murmur Brother     Allergies  Allergen Reactions  . Shellfish Allergy Anaphylaxis  . Codeine Nausea Only  . Morphine And Related Other (See Comments)    slurring words  . Phenergan [Promethazine] Other (See Comments)    Causes blood presser to bottom out  . Sulfa Antibiotics Rash    Current Outpatient Medications on File Prior to Visit  Medication Sig Dispense Refill  . amLODipine (NORVASC) 2.5 MG tablet TAKE 1 TABLET BY MOUTH DAILY 90 tablet 0  . atorvastatin (LIPITOR) 20 MG tablet TAKE 1 TABLET BY MOUTH DAILY 90 tablet 0  . Azelastine-Fluticasone 137-50 MCG/ACT SUSP Place 1 spray into both nostrils as needed (allergies).    . EPINEPHrine 0.3 mg/0.3 mL IJ SOAJ injection Use as needed for severe allergic reaction  3  . levothyroxine (SYNTHROID, LEVOTHROID) 100 MCG tablet Take 1 tablet (100 mcg total) by mouth daily. 90 tablet 1  . warfarin (COUMADIN) 10 MG tablet Take 10 mg by mouth daily.     No current facility-administered medications on file prior to visit.     BP 126/74 (BP Location: Left Arm, Patient Position: Sitting, Cuff Size: Normal)   Pulse 61   Temp 98.4 F (36.9 C) (Oral)   Resp 18   Wt 202 lb 3.2 oz (91.7 kg)   SpO2 98%   BMI 32.64 kg/m       Objective:   Physical Exam  Constitutional: She is oriented to person, place, and time. She appears well-developed and well-nourished.  Cardiovascular: Normal rate, regular rhythm and normal heart sounds.  No murmur heard. Pulmonary/Chest: Effort normal and breath sounds normal. No respiratory distress. She has no wheezes.  Musculoskeletal: She exhibits no edema.       Cervical back: She exhibits no tenderness.       Thoracic back: She exhibits no tenderness.       Lumbar back: She exhibits tenderness.  Neurological: She is alert and oriented to person, place, and time.  Bilateral lower extremity strength is 5 out of 5  Skin: Skin is warm and dry.  Hair appears  mildly thin.  Psychiatric: She has a normal mood and affect. Her behavior is normal. Judgment and thought content normal.          Assessment & Plan:  Hypertension-blood pressure stable on  current medication continue same.  Hyperlipidemia- lipids stable continue statin.  Migraines-uncontrolled.  Will give trial of Imitrex as needed.  She is advised not to take more than 2 tabs in 24 hours.  She is also advised to avoid use of Excedrin due to being on Coumadin.  She has been using this sporadically.  Advised okay to take Tylenol if needed.  Hair loss-we will check CBC and TSH.  Hypothyroid--continue Synthroid obtain follow-up TSH  Left-sided sciatica- unable to take NSAIDs due to Coumadin use.  We will give trial of Medrol Dosepak.  If symptoms worsen or fail to improve consider referral to PT.  Performance anxiety- Rx provided for as needed propranolol.  Hypercoagulable state-continues Coumadin which is being monitored by Coumadin clinic at [facility].    Flu shot today.

## 2018-01-10 ENCOUNTER — Other Ambulatory Visit: Payer: Self-pay | Admitting: Family

## 2018-01-10 MED ORDER — ATORVASTATIN CALCIUM 20 MG PO TABS: 20.0000 mg | ORAL_TABLET | Freq: Every day | ORAL | 0 refills | Status: DC

## 2018-01-10 MED ORDER — AMLODIPINE BESYLATE 2.5 MG PO TABS: 2.5000 mg | ORAL_TABLET | Freq: Every day | ORAL | 0 refills | Status: DC

## 2018-03-04 ENCOUNTER — Telehealth: Payer: Self-pay

## 2018-03-04 ENCOUNTER — Other Ambulatory Visit: Payer: Self-pay | Admitting: Family

## 2018-03-04 ENCOUNTER — Encounter: Payer: Self-pay | Admitting: Family

## 2018-03-04 DIAGNOSIS — Z0279 Encounter for issue of other medical certificate: Secondary | ICD-10-CM

## 2018-03-04 MED ORDER — ATORVASTATIN CALCIUM 20 MG PO TABS: 20.0000 mg | ORAL_TABLET | Freq: Every day | ORAL | 1 refills | Status: DC

## 2018-03-04 MED ORDER — EPINEPHRINE 0.3 MG/0.3ML IJ SOAJ: 0.3000 mg | INTRAMUSCULAR | 3 refills | Status: DC | PRN

## 2018-03-04 MED ORDER — AZELASTINE-FLUTICASONE 137-50 MCG/ACT NA SUSP: 1.0000 | NASAL | 5 refills | Status: DC | PRN

## 2018-03-04 MED ORDER — AMLODIPINE BESYLATE 2.5 MG PO TABS: 2.5000 mg | ORAL_TABLET | Freq: Every day | ORAL | 1 refills | Status: DC

## 2018-03-04 NOTE — Telephone Encounter (Signed)
PA initiated via Covermymeds; KEY: [id number]. Awaiting determination.

## 2018-03-08 MED ORDER — AZELASTINE HCL 137 MCG/SPRAY NA SOLN: 1.0000 | Freq: Two times a day (BID) | NASAL | 5 refills | Status: DC

## 2018-03-08 MED ORDER — FLUTICASONE PROPIONATE 50 MCG/ACT NA SUSP: 1.0000 | Freq: Two times a day (BID) | NASAL | 6 refills | Status: DC

## 2018-03-08 NOTE — Telephone Encounter (Signed)
PA denied. Formulary alternatives: azelastine nasal spray, fluticasone nasal spray, and mometasone nasal spray.

## 2018-03-08 NOTE — Telephone Encounter (Signed)
Please notify pt that insurance denied dymista but I sent the two nasal sprays in dymista separately for her to take instead.

## 2018-03-09 NOTE — Telephone Encounter (Signed)
Patient advised of medication change.

## 2018-03-25 ENCOUNTER — Telehealth: Payer: Self-pay | Admitting: *Deleted

## 2018-03-25 NOTE — Telephone Encounter (Signed)
Patient called and stated that she received a bill for $29 and she has not been seen in awhile.  Advised that this was for prior auth for a medication that we tried to bill the ins.  Per Carollee Herter not to worry and we will take care of bill.  Patient notifed.

## 2018-03-28 NOTE — Telephone Encounter (Signed)
I will send to charge correction to have adjusted , I will CC you on it.  I would say you can do this if you get more of these .    Thanks,  [place]

## 2018-04-01 ENCOUNTER — Encounter: Payer: Self-pay | Admitting: Family

## 2018-04-07 MED ORDER — AMLODIPINE BESYLATE 2.5 MG PO TABS: 2.5000 mg | ORAL_TABLET | Freq: Every day | ORAL | 1 refills | Status: DC

## 2018-04-07 MED ORDER — LEVOTHYROXINE SODIUM 100 MCG PO TABS: 100.0000 ug | ORAL_TABLET | Freq: Every day | ORAL | 1 refills | Status: DC

## 2018-04-07 MED ORDER — ATORVASTATIN CALCIUM 20 MG PO TABS: 20.0000 mg | ORAL_TABLET | Freq: Every day | ORAL | 1 refills | Status: DC

## 2018-04-18 ENCOUNTER — Telehealth: Payer: BLUE CROSS/BLUE SHIELD | Admitting: Family

## 2018-04-18 ENCOUNTER — Encounter: Payer: Self-pay | Admitting: Family

## 2018-04-18 ENCOUNTER — Ambulatory Visit: Payer: Self-pay | Admitting: *Deleted

## 2018-04-18 ENCOUNTER — Telehealth: Payer: Self-pay | Admitting: Family

## 2018-04-18 DIAGNOSIS — R059 Cough, unspecified: Secondary | ICD-10-CM

## 2018-04-18 DIAGNOSIS — R0602 Shortness of breath: Secondary | ICD-10-CM

## 2018-04-18 DIAGNOSIS — R05 Cough: Secondary | ICD-10-CM

## 2018-04-18 NOTE — Telephone Encounter (Signed)
Pt calling to request to have a order placed for testing at one of the drive through testing sites. Pt states she was around an associate on last Tuesday who states that they were "presumed positive" for the corona virus. Pt states that the associate is now in [place] and is very sick at this time.  Pt states she is now experiencing a dry cough, sneezing and a tight feeling in chest, feels congested and feels like something is stuck in her throat. Pt denies fever or shortness of breath.Pt does not want to come in to office for appt. Notified pt that she should initiate an evisit through the [facility] web site to answer questions to see if she would qualify to be tested for COVID-19. Pt verbalized understanding.  Reason for Disposition . [1] Cough occurs AND [2] within 14 days of COVID-19 EXPOSURE  Answer Assessment - Initial Assessment Questions 1. PLACE of CONTACT: "Where were you when you were exposed to COVID-19  (coronavirus disease 2019)?" (e.g., city, state, country)     On Tuesday of last week and associate went home Friday 2. TYPE of CONTACT: "How much contact was there?" (e.g., live in same house, work in same office, same school)     Diner with her at a table booth and was across from me and associate was beside me for about 2 1/2-3 hours 3. DATE of CONTACT: "When did you have contact with a coronavirus patient?" (e.g., days)     Tuesday of last week;Sunday was confirmed to be presrumptive positive but associate began to get sick Saturday eveing 4. DURATION of CONTACT: "How long were you in contact with the COVID-19 (coronavirus disease) patient?" (e.g., a few seconds, passed by person, a few minutes, live with the patient)     2 1/2-3 hours 5. SYMPTOMS: "Do you have any symptoms?" (e.g., fever, cough, breathing difficulty)     Dry Cough, tightness in chest and non smoker, feels like something is stuck in throat, no fever, feels congested 6. PREGNANCY OR POSTPARTUM: "Is there any chance you  are pregnant?" "When was your last menstrual period?" "Did you deliver in the last 2 weeks?"     No cycles since 40 7. HIGH RISK: "Do you have any heart or lung problems? Do you have a weakened immune system?" (e.g., CHF, COPD, asthma, HIV positive, chemotherapy, renal failure, diabetes mellitus, sickle cell anemia)     Autoimmune issues and history of blood clots, CAD and has surgery, SPO  Protocols used: CORONAVIRUS (COVID-19) EXPOSURE-A-AH

## 2018-04-18 NOTE — Progress Notes (Signed)
E-Visit for [employer] Virus Screening  Based on what you have shared with me, I feel your condition warrants further evaluation for Corona virus and I recommend that you be seen and evaluated "face to face".   We can not order tests for the drive-thru at this time. Our IT department is currently working on this. You need to avoid contact with others. If your symptoms worsen, you will need to go to the ED.   Approximately 5 minutes was spent documenting and reviewing patient's chart.    Our Emergency Departments are best equipped to handle patients with potential Corona Virus. I recommend the following:  . If you are having a true medical emergency please call 911. . Since you are considered high risk for Corona virus virus because of a known exposure, fever, shortness of breath and cough, OR if you have severe symptoms of any kind, seek medical care at an emergency room.  . Please call ahead and tell them that you were seen by telemedicine and they have recommended that you have a face to face evaluation for Corona virus.  [facility] Emergency Department [address] [place], [place], [place] [address] [phone]  . [facility] Emergency Department [address] [place], [place] [address] [phone]  . [facility] Emergency Department [address] [place], [place], [place] [address] [phone]  . [facility] Emergency Department [address] [place], [place], [place] [address] [phone]  . [facility] Emergency Department [address] [place], [place], [place] [address] [phone]  NOTE: If you entered your credit card information for this eVisit, you will not be charged. You may see a "hold" on your card for the $35 but that hold will drop off and you will not have a charge processed.   Your e-visit answers were reviewed by a board certified advanced clinical practitioner to  complete your personal care plan.  Thank you for using e-Visits.

## 2018-04-18 NOTE — Telephone Encounter (Signed)
Copied from CRM [id number]. Topic: Quick Communication - See Telephone Encounter >> Apr 18, 2018  5:25 PM Lorrine Kin, [place] wrote: CRM for notification. See Telephone encounter for: 04/18/18. Patient calling and states that she had dinner with a friend last week who has done some international travel. Nurse triage currently unavailable. States that person was tested for COVID 19 and her test was "presumably positive". Patient states that she is sneezing and coughing. Would like to know if an order could be placed and she go to the "drive thru" at one of the ERs tomorrow. Please advise.  CB#: [phone]

## 2018-04-19 ENCOUNTER — Encounter: Payer: Self-pay | Admitting: Family

## 2018-04-19 NOTE — Telephone Encounter (Signed)
See telephone note from yesterday. Closing this mychart encounter.

## 2018-04-19 NOTE — Telephone Encounter (Signed)
Please advise 

## 2018-04-19 NOTE — Telephone Encounter (Signed)
Duplicate encounter, see previous encounter threads.

## 2018-04-19 NOTE — Telephone Encounter (Signed)
Spoke to patient, she is afebrile, + dry cough, known exposure to Covid. Await further information on tent testing sites from our system. In the meantime, I have advised the pt to stay home in the meantime and if severe worsening symptoms such as SOB to proceed to the ER. She verbalizes understanding.

## 2018-04-19 NOTE — Telephone Encounter (Signed)
Spoke to patient and advised her to proceed to Covid testing site [address].

## 2018-04-25 ENCOUNTER — Telehealth: Payer: Self-pay | Admitting: Family

## 2018-04-25 NOTE — Telephone Encounter (Signed)
Patient notified of negative covid test

## 2018-04-26 ENCOUNTER — Telehealth: Payer: Self-pay | Admitting: *Deleted

## 2018-04-26 NOTE — Telephone Encounter (Signed)
Received Lab Report results from [facility]; forwarded to provider/SLS 03/24  

## 2018-05-04 LAB — NOVEL CORONAVIRUS, NAA: SARS-CoV-2, NAA: NOT DETECTED

## 2018-05-05 ENCOUNTER — Telehealth: Payer: Self-pay | Admitting: *Deleted

## 2018-05-05 NOTE — Telephone Encounter (Signed)
Received Lab Report results from [facility]; forwarded to provider/SLS 04/02

## 2018-05-22 ENCOUNTER — Other Ambulatory Visit: Payer: Self-pay | Admitting: Family

## 2018-07-18 ENCOUNTER — Telehealth: Payer: Self-pay

## 2018-07-18 ENCOUNTER — Encounter: Payer: Self-pay | Admitting: Family

## 2018-07-18 DIAGNOSIS — R109 Unspecified abdominal pain: Secondary | ICD-10-CM

## 2018-07-18 NOTE — Telephone Encounter (Signed)
PA approved. Effective from 07/18/2018 through 07/17/2019

## 2018-07-18 NOTE — Telephone Encounter (Signed)
PA initiated via Covermymeds; KEY: [id number]. Awaiting determination.

## 2018-07-20 ENCOUNTER — Ambulatory Visit (INDEPENDENT_AMBULATORY_CARE_PROVIDER_SITE_OTHER): Payer: BC Managed Care – PPO | Admitting: Family Medicine

## 2018-07-20 ENCOUNTER — Ambulatory Visit (HOSPITAL_BASED_OUTPATIENT_CLINIC_OR_DEPARTMENT_OTHER)
Admission: RE | Admit: 2018-07-20 | Discharge: 2018-07-20 | Disposition: A | Discharge status: Home / Self Care | Payer: BC Managed Care – PPO | Source: Ambulatory Visit | Attending: Family Medicine | Admitting: Family Medicine

## 2018-07-20 ENCOUNTER — Other Ambulatory Visit: Payer: Self-pay

## 2018-07-20 ENCOUNTER — Encounter: Payer: Self-pay | Admitting: Family Medicine

## 2018-07-20 ENCOUNTER — Ambulatory Visit: Payer: Self-pay

## 2018-07-20 VITALS — Ht 67.0 in | Wt 200.0 lb

## 2018-07-20 DIAGNOSIS — M222X1 Patellofemoral disorders, right knee: Secondary | ICD-10-CM | POA: Diagnosis not present

## 2018-07-20 DIAGNOSIS — M79675 Pain in left toe(s): Secondary | ICD-10-CM

## 2018-07-20 DIAGNOSIS — M19079 Primary osteoarthritis, unspecified ankle and foot: Secondary | ICD-10-CM | POA: Insufficient documentation

## 2018-07-20 DIAGNOSIS — M7061 Trochanteric bursitis, right hip: Secondary | ICD-10-CM | POA: Diagnosis not present

## 2018-07-20 MED ORDER — PENNSAID 2 % TD SOLN: 1.0000 "application " | Freq: Two times a day (BID) | TRANSDERMAL | 2 refills | Status: DC

## 2018-07-20 NOTE — Assessment & Plan Note (Signed)
Pain likely the result of degenerative changes.  Does not appear to be gout. -Pennsaid. -X-ray. -Counseled on obtaining a stiffer soled shoe to help with less flexion into the first MTP joint. -If no improvement consider injection.

## 2018-07-20 NOTE — Assessment & Plan Note (Signed)
Likely the result of weakness.  Has tenderness over the greater trochanteric bursa. -X-ray. -Counseled on home exercise therapy and supportive care. -If no improvement consider injection or physical therapy.

## 2018-07-20 NOTE — Progress Notes (Signed)
Melinda Lopez - 58 y.o. female MRN [id number]  Date of birth: 04/26/1960  SUBJECTIVE:  Including CC & ROS.  Chief Complaint  Patient presents with  . Foot Pain    left foot/toe  . Hip Pain    right hip    Melinda Lopez is a 58 y.o. female that is presenting with right hip pain, right knee pain and left great toe pain. The hip pain has been present for over a year. The pain is on the lateral aspect of the hip. It it worse with walking. She has been using the elliptical to help with the pain. The pain is mild to severe. It is worse with getting up from a squatting position. The pain is sharp. The pain is intermittent in nature.   The knee pain is anterior. Denies an inciting event or injury. The pain is worse with getting up from a seated position. No mechanical symptoms. Pain is mild to moderate. Pain is sharp. Can use ibuprofen intermittently but is being treated with warfarin for a hypercoagulable state.   Having left great toe pain that is throbbing. She has swelling of the left 1st MTP joint. Denies injury or inciting event. Worse with walking and pain with being touched. Occurring on the dorsal 1st MTP joint. Has not had any improvement. Pain is staying the same. Localized to the joint.    Review of Systems  Constitutional: Negative for fever.  HENT: Negative for congestion.   Respiratory: Negative for cough.   Cardiovascular: Negative for chest pain.  Gastrointestinal: Negative for abdominal pain.  Musculoskeletal: Positive for arthralgias and gait problem.  Skin: Negative for color change.  Neurological: Negative for weakness.  Hematological: Negative for adenopathy.    HISTORY: Past Medical, Surgical, Social, and Family History Reviewed & Updated per EMR.   Pertinent Historical Findings include:  Past Medical History:  Diagnosis Date  . Carotid artery stenosis   . Chronic headaches   . Fatty liver 04/07/2016  . Hx of adenomatous polyp of colon 07/30/2011  . Hx of  blood clots    fingers  . Hyperlipemia   . Hypertension   . Hypothyroidism   . Migraine 1982   pt states migraines since then.   Marland Kitchen. PFO (patent foramen ovale)   . TIA (transient ischemic attack)     Past Surgical History:  Procedure Laterality Date  . BREAST LUMPECTOMY Right 2000  . CAROTID ARTERY - SUBCLAVIAN ARTERY BYPASS GRAFT Right 2006  . COLONOSCOPY    . UPPER GASTROINTESTINAL ENDOSCOPY      Allergies  Allergen Reactions  . Shellfish Allergy Anaphylaxis  . Codeine Nausea Only  . Morphine And Related Other (See Comments)    slurring words  . Phenergan [Promethazine] Other (See Comments)    Causes blood presser to bottom out  . Sulfa Antibiotics Rash    Family History  Problem Relation Age of Onset  . Heart disease Mother   . Kidney disease Mother   . Heart disease Father   . Diabetes Sister   . Kidney disease Sister   . Heart disease Maternal Grandmother   . Heart murmur Brother      Social History   Socioeconomic History  . Marital status: Married    Spouse name: Not on file  . Number of children: 2  . Years of education: Not on file  . Highest education level: Not on file  Occupational History  . Not on file  Social Needs  . Financial  resource strain: Not on file  . Food insecurity    Worry: Not on file    Inability: Not on file  . Transportation needs    Medical: Not on file    Non-medical: Not on file  Tobacco Use  . Smoking status: Former Smoker    Types: Cigarettes    Quit date: 07/29/2002    Years since quitting: 15.9  . Smokeless tobacco: Never Used  Substance and Sexual Activity  . Alcohol use: Yes    Alcohol/week: 0.0 standard drinks    Comment: 2 per day  . Drug use: No  . Sexual activity: Not on file  Lifestyle  . Physical activity    Days per week: Not on file    Minutes per session: Not on file  . Stress: Not on file  Relationships  . Social Herbalist on phone: Not on file    Gets together: Not on file     Attends religious service: Not on file    Active member of club or organization: Not on file    Attends meetings of clubs or organizations: Not on file    Relationship status: Not on file  . Intimate partner violence    Fear of current or ex partner: Not on file    Emotionally abused: Not on file    Physically abused: Not on file    Forced sexual activity: Not on file  Other Topics Concern  . 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 Normal strength resistance with hip flexion. Weakness with hip abduction. Right knee: No effusion. Normal strength resistance. Normal range of motion. No tenderness to palpation over the medial lateral joint line. Some pain with patellar grind. Negative McMurray's test. Left foot. Bossing of the first MTP joint. Normal range of motion. No redness or ecchymosis. Neurovascularly intact  Limited ultrasound: Left great toe:  Small effusion within the first MTP joint. Degenerative changes within the first MTP joint with abnormality of the joint  Summary: Significant degenerative changes of the first MTP joint  Ultrasound and interpretation by Clearance Coots, MD      ASSESSMENT & PLAN:   Patellofemoral syndrome Pain likely  result of patellofemoral syndrome.  Has weakness with glue medius likely resulting to her pain. -Pennsaid.  Counseled on its use with warfarin. -X-ray. -Counseled on home exercise therapy and supportive care. -If no improvement can consider injection or physical therapy.  Greater trochanteric bursitis of right hip Likely the result of weakness.  Has tenderness over the greater trochanteric bursa. -X-ray. -Counseled on home exercise therapy and supportive care. -If no improvement consider injection or physical therapy.  Pain in toe of left foot Pain  likely the result of degenerative changes.  Does not appear to be gout. -Pennsaid. -X-ray. -Counseled on obtaining a stiffer soled shoe to help with less flexion into the first MTP joint. -If no improvement consider injection.

## 2018-07-20 NOTE — Assessment & Plan Note (Signed)
Pain likely result of patellofemoral syndrome.  Has weakness with glue medius likely resulting to her pain. -Pennsaid.  Counseled on its use with warfarin. -X-ray. -Counseled on home exercise therapy and supportive care. -If no improvement can consider injection or physical therapy.

## 2018-07-20 NOTE — Patient Instructions (Signed)
Nice to meet you Please try ice  Please try the exercises I will call you with the results from today  I will call your hematologist about the rub on medicine  Please try a stiffer soled shoe. Make sure it is comfortable. And then avoid walking if you are having pain greater than a 2/10.  You may want to try an insole as well. Superfeet is a type of insole you can try.   Please send me a message in MyChart with any questions or updates.  Please see me back in 4 weeks.   --Dr. Raeford Razor

## 2018-07-21 ENCOUNTER — Telehealth: Payer: Self-pay | Admitting: Family Medicine

## 2018-07-21 NOTE — Telephone Encounter (Signed)
Spoke with patient about her results.   [name], MD [facility] 07/21/2018, 8:19 AM

## 2018-07-25 ENCOUNTER — Other Ambulatory Visit: Payer: Self-pay

## 2018-07-25 ENCOUNTER — Other Ambulatory Visit: Payer: Self-pay | Admitting: Family

## 2018-07-25 ENCOUNTER — Ambulatory Visit (HOSPITAL_BASED_OUTPATIENT_CLINIC_OR_DEPARTMENT_OTHER)
Admission: RE | Admit: 2018-07-25 | Discharge: 2018-07-25 | Disposition: A | Discharge status: Home / Self Care | Payer: BC Managed Care – PPO | Source: Ambulatory Visit | Attending: Family | Admitting: Family

## 2018-07-25 DIAGNOSIS — R109 Unspecified abdominal pain: Secondary | ICD-10-CM | POA: Insufficient documentation

## 2018-07-26 ENCOUNTER — Encounter: Payer: Self-pay | Admitting: Family

## 2018-07-27 ENCOUNTER — Ambulatory Visit (INDEPENDENT_AMBULATORY_CARE_PROVIDER_SITE_OTHER): Payer: BC Managed Care – PPO | Admitting: Family Medicine

## 2018-07-27 ENCOUNTER — Ambulatory Visit: Payer: Self-pay

## 2018-07-27 ENCOUNTER — Other Ambulatory Visit: Payer: Self-pay

## 2018-07-27 ENCOUNTER — Encounter: Payer: Self-pay | Admitting: Family Medicine

## 2018-07-27 VITALS — BP 150/92 | HR 69 | Ht 67.0 in | Wt 200.0 lb

## 2018-07-27 DIAGNOSIS — M7061 Trochanteric bursitis, right hip: Secondary | ICD-10-CM

## 2018-07-27 DIAGNOSIS — M19079 Primary osteoarthritis, unspecified ankle and foot: Secondary | ICD-10-CM

## 2018-07-27 MED ORDER — TRIAMCINOLONE ACETONIDE 40 MG/ML IJ SUSP
20.0000 mg | Freq: Once | INTRAMUSCULAR | Status: AC
  Administered 2018-07-27: 20 mg via INTRA_ARTICULAR

## 2018-07-27 MED ORDER — METHYLPREDNISOLONE ACETATE 40 MG/ML IJ SUSP
40.0000 mg | Freq: Once | INTRAMUSCULAR | Status: AC
  Administered 2018-07-27: 10:00:00 40 mg via INTRA_ARTICULAR

## 2018-07-27 NOTE — Progress Notes (Signed)
Melinda Lopez - 58 y.o. female MRN [id number]  Date of birth: 09/01/1960  SUBJECTIVE:  Including CC & ROS.  Chief Complaint  Patient presents with  . Hip Pain    right hip  . Foot Pain    left foot    Melinda Lopez is a 58 y.o. female that is presenting with left first MTP joint pain and right lateral hip pain.  She has been using the medication a rub on her toe with limited improvement.  The pain is mild to severe.  It seems to be worse with walking.  It is intermittent.  Is localized to the first MTP joint.  She has swelling from time to time.  It is throbbing in nature.  Denies any inciting event.  Lateral hip pain is occurring over the greater trochanter.  Is localized to this region.  It seems to be worse with walking.  It is sharp in nature.  It has been ongoing for a few months.  Denies any inciting event or trauma..  Independent review of the right hip x-ray from 6/17 shows a soft tissue calcification over the greater trochanter.  Independent review of the left great toe from 6/17 shows degenerative changes of the first MTP joint.   Review of Systems  Constitutional: Negative for fever.  HENT: Negative for congestion.   Respiratory: Negative for cough.   Cardiovascular: Negative for chest pain.  Gastrointestinal: Negative for abdominal distention.  Musculoskeletal: Positive for joint swelling.  Skin: Negative for color change.  Neurological: Negative for weakness.  Hematological: Negative for adenopathy.    HISTORY: Past Medical, Surgical, Social, and Family History Reviewed & Updated per EMR.   Pertinent Historical Findings include:  Past Medical History:  Diagnosis Date  . Carotid artery stenosis   . Chronic headaches   . Fatty liver 04/07/2016  . Hx of adenomatous polyp of colon 07/30/2011  . Hx of blood clots    fingers  . Hyperlipemia   . Hypertension   . Hypothyroidism   . Migraine 1982   pt states migraines since then.   Marland Kitchen. PFO (patent foramen  ovale)   . TIA (transient ischemic attack)     Past Surgical History:  Procedure Laterality Date  . BREAST LUMPECTOMY Right 2000  . CAROTID ARTERY - SUBCLAVIAN ARTERY BYPASS GRAFT Right 2006  . COLONOSCOPY    . UPPER GASTROINTESTINAL ENDOSCOPY      Allergies  Allergen Reactions  . Shellfish Allergy Anaphylaxis  . Codeine Nausea Only  . Morphine And Related Other (See Comments)    slurring words  . Phenergan [Promethazine] Other (See Comments)    Causes blood presser to bottom out  . Sulfa Antibiotics Rash    Family History  Problem Relation Age of Onset  . Heart disease Mother   . Kidney disease Mother   . Heart disease Father   . Diabetes Sister   . Kidney disease Sister   . Heart disease Maternal Grandmother   . Heart murmur Brother      Social History   Socioeconomic History  . Marital status: Married    Spouse name: Not on file  . Number of children: 2  . Years of education: Not on file  . Highest education level: Not on file  Occupational History  . Not on file  Social Needs  . Financial resource strain: Not on file  . Food insecurity    Worry: Not on file    Inability: Not on file  .  Transportation needs    Medical: Not on file    Non-medical: Not on file  Tobacco Use  . Smoking status: Former Smoker    Types: Cigarettes    Quit date: 07/29/2002    Years since quitting: 16.0  . Smokeless tobacco: Never Used  Substance and Sexual Activity  . Alcohol use: Yes    Alcohol/week: 0.0 standard drinks    Comment: 2 per day  . Drug use: No  . Sexual activity: Not on file  Lifestyle  . Physical activity    Days per week: Not on file    Minutes per session: Not on file  . Stress: Not on file  Relationships  . Social Musicianconnections    Talks on phone: Not on file    Gets together: Not on file    Attends religious service: Not on file    Active member of club or organization: Not on file    Attends meetings of clubs or organizations: Not on file     Relationship status: Not on file  . Intimate partner violence    Fear of current or ex partner: Not on file    Emotionally abused: Not on file    Physically abused: Not on file    Forced sexual activity: Not on file  Other Topics Concern  . 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 Normal internal and external rotation. Normal strength resistance. Left toe. Tenderness to palpation over the left first MTP joint. Pain with extension. Neurovascularly intact   Aspiration/Injection Procedure Note Melinda Lopez 12/15/1960  Procedure: Injection Indications: Left great toe pain  Procedure Details Consent: Risks of procedure as well as the alternatives and risks of each were explained to the (patient/caregiver).  Consent for procedure obtained. Time Out: Verified patient identification, verified procedure, site/side was marked, verified correct patient position, special equipment/implants available, medications/allergies/relevent history reviewed, required imaging and test results available.  Performed.  The area was cleaned with iodine and alcohol swabs.    The left first MTP joint was injected using 0.5 cc's of 40 mg Kenalog and 0.5 cc's of  0.25% bupivacaine with a 25 1 1/2" needle.  Ultrasound was used. Images were obtained in trans views showing the injection.     A sterile dressing was applied.  Patient did tolerate procedure well.   Aspiration/Injection Procedure Note Melinda NeatDonnette M Phung 12/03/1960  Procedure: Injection Indications: right hip pain   Procedure Details Consent: Risks of procedure as well as the alternatives and risks of each were explained to the (patient/caregiver).  Consent for procedure obtained. Time Out: Verified patient identification, verified procedure, site/side was marked,  verified correct patient position, special equipment/implants available, medications/allergies/relevent history reviewed, required imaging and test results available.  Performed.  The area was cleaned with iodine and alcohol swabs.    The greater trochanteric bursa was injected using 1 cc's of 40 mg Depo-Medrol and 4 cc's of 0.25% bupivacaine with a 22 3 1/2" needle.  Ultrasound was used. Images were obtained in trans views showing the injection.     A sterile dressing was applied.  Patient did tolerate procedure well.      ASSESSMENT & PLAN:   Arthritis of metatarsophalangeal (MTP) joint of great toe Limited improvement with conservative measures to date. -Injection -Counseled on supportive care -If no improvement consider custom orthotics with posting   Greater trochanteric bursitis of right hip Chronic appearing on xray. Weakness on exam.  - injection  - counseled on HEP and supportive care - if no improvement consider PRP or PT

## 2018-07-27 NOTE — Assessment & Plan Note (Signed)
Chronic appearing on xray. Weakness on exam.  - injection  - counseled on HEP and supportive care - if no improvement consider PRP or PT

## 2018-07-27 NOTE — Assessment & Plan Note (Signed)
Limited improvement with conservative measures to date. -Injection -Counseled on supportive care -If no improvement consider custom orthotics with posting

## 2018-07-27 NOTE — Patient Instructions (Signed)
Good to see you Please try ice if needed   Take tylenol 650 mg three times a day is the best evidence based medicine we have for arthritis. This can help with toe pain.  Glucosamine sulfate 750mg  twice a day is a supplement that has been shown to help moderate to severe arthritis. Vitamin D 2000 IU daily Fish oil 2 grams daily.  Tumeric 500mg  twice daily.  Capsaicin topically up to four times a day may also help with pain.   Please send me a message in MyChart with any questions or updates. Or if you want to try physical therapy.  Please see me back in 4-6 weeks.   --Dr. Raeford Razor

## 2018-08-17 ENCOUNTER — Ambulatory Visit: Payer: BC Managed Care – PPO | Admitting: Family Medicine

## 2018-08-24 ENCOUNTER — Other Ambulatory Visit: Payer: Self-pay | Admitting: Family

## 2018-09-27 ENCOUNTER — Other Ambulatory Visit: Payer: Self-pay | Admitting: Family

## 2018-10-03 ENCOUNTER — Other Ambulatory Visit: Payer: Self-pay | Admitting: Family

## 2018-10-03 NOTE — Telephone Encounter (Signed)
Received request for Euthyrox 163mcg. Epic shows refill of 09/27/18 for levothyroxine. Spoke with pharmacist and verified these are the same medication and they have Refill from 8/25 and it was filled for Euthyrox. No refills needed at this time. Refill denial sent.

## 2018-10-18 ENCOUNTER — Other Ambulatory Visit: Payer: Self-pay | Admitting: Family

## 2018-10-18 MED ORDER — ATORVASTATIN CALCIUM 20 MG PO TABS: 20.0000 mg | ORAL_TABLET | Freq: Every day | ORAL | 1 refills | Status: DC

## 2018-10-18 NOTE — Telephone Encounter (Signed)
Medication Refill - Medication: atorvastatin (LIPITOR) 20 MG tablet    Has the patient contacted their pharmacy? Yes.   (Agent: If no, request that the patient contact the pharmacy for the refill.) (Agent: If yes, when and what did the pharmacy advise?)  Preferred Pharmacy (with phone number or street name):  [id number] - [place], [address]  [address] [place] [address]  Phone: [phone] Fax: [phone]  Not a 24 hour pharmacy; exact hours not known.     Agent: Please be advised that RX refills may take up to 3 business days. We ask that you follow-up with your pharmacy.

## 2018-10-18 NOTE — Telephone Encounter (Signed)
Requested medication (s) are due for refill today: yes  Requested medication (s) are on the active medication list: yes  Last refill:  04/01/2018  Future visit scheduled:yes  Notes to clinic: review for refill   Requested Prescriptions  Pending Prescriptions Disp Refills   atorvastatin (LIPITOR) 20 MG tablet 90 tablet 1    Sig: Take 1 tablet (20 mg total) by mouth daily.     Cardiovascular:  Antilipid - Statins Failed - 10/18/2018  1:40 PM      Failed - Total Cholesterol in normal range and within 360 days    Cholesterol  Date Value Ref Range Status  06/15/2017 150 0 - 200 mg/dL Final    Comment:    ATP III Classification       Desirable:  < 200 mg/dL               Borderline High:  200 - 239 mg/dL          High:  > = 240 mg/dL         Failed - LDL in normal range and within 360 days    LDL Cholesterol  Date Value Ref Range Status  06/15/2017 66 0 - 99 mg/dL Final         Failed - HDL in normal range and within 360 days    HDL  Date Value Ref Range Status  06/15/2017 64.80 >39.00 mg/dL Final         Failed - Triglycerides in normal range and within 360 days    Triglycerides  Date Value Ref Range Status  06/15/2017 95.0 0.0 - 149.0 mg/dL Final    Comment:    Normal:  <150 mg/dLBorderline High:  150 - 199 mg/dL         Passed - Patient is not pregnant      Passed - Valid encounter within last 12 months    Recent Outpatient Visits          10 months ago Migraine without status migrainosus, not intractable, unspecified migraine type   [facility], [facility]   1 year ago Annual physical exam   [facility], NP   1 year ago Hypothyroidism, unspecified type   [facility], NP   2 years ago Preventative health care   [facility], NP    3 years ago Hypothyroidism, unspecified hypothyroidism type   [facility], NP      Future Appointments            In 1 month Debbrah Alar, NP [facility], [facility]

## 2018-10-19 ENCOUNTER — Other Ambulatory Visit: Payer: Self-pay | Admitting: Family

## 2018-10-25 ENCOUNTER — Other Ambulatory Visit: Payer: Self-pay

## 2018-10-25 ENCOUNTER — Ambulatory Visit (INDEPENDENT_AMBULATORY_CARE_PROVIDER_SITE_OTHER): Payer: BC Managed Care – PPO | Admitting: Medical

## 2018-10-25 VITALS — BP 153/92 | HR 74 | Temp 96.9°F | Wt 198.0 lb

## 2018-10-25 DIAGNOSIS — M5442 Lumbago with sciatica, left side: Secondary | ICD-10-CM

## 2018-10-25 MED ORDER — CYCLOBENZAPRINE HCL 5 MG PO TABS: ORAL_TABLET | ORAL | 0 refills | Status: DC

## 2018-10-25 MED ORDER — PREDNISONE 10 MG PO TABS: ORAL_TABLET | ORAL | 0 refills | Status: DC

## 2018-10-25 NOTE — Patient Instructions (Addendum)
Recent severe low back pain with sciatica.  I am glad that your pain did decrease some compared to yesterday's level of near 10 level type pain.  Will prescribe prednisone 5-day tapered dose and Flexeril to use at night.  Rx advisement given.  Hopefully pain will begin to taper down.  Red flag signs and symptoms reviewed that would necessitate ED evaluation.  Please update me on how you are doing by Thursday.  If pain is not coming down then could refer you to sports medicine or orthopedist.  He has seen both already in the past.  Sports medicine might be a quicker referral.  Sometimes specialist can order MRI if needed quicker than we can.  Recommend going ahead and getting lumbar spine x-ray today.  Follow-up in 7 to 10 days or as needed.
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
## 2018-10-26 ENCOUNTER — Ambulatory Visit (HOSPITAL_BASED_OUTPATIENT_CLINIC_OR_DEPARTMENT_OTHER)
Admission: RE | Admit: 2018-10-26 | Discharge: 2018-10-26 | Disposition: A | Discharge status: Home / Self Care | Payer: BC Managed Care – PPO | Source: Ambulatory Visit | Attending: Medical | Admitting: Medical

## 2018-10-26 ENCOUNTER — Encounter: Payer: Self-pay | Admitting: Family

## 2018-10-26 ENCOUNTER — Encounter: Payer: Self-pay | Admitting: Medical

## 2018-10-26 DIAGNOSIS — M5442 Lumbago with sciatica, left side: Secondary | ICD-10-CM | POA: Diagnosis not present

## 2018-10-26 NOTE — Telephone Encounter (Signed)
Spoke with pt. Pt scheduled tomorrow to discuss lab results.

## 2018-10-27 ENCOUNTER — Other Ambulatory Visit: Payer: Self-pay

## 2018-10-27 ENCOUNTER — Encounter: Payer: Self-pay | Admitting: Medical

## 2018-10-27 ENCOUNTER — Ambulatory Visit (INDEPENDENT_AMBULATORY_CARE_PROVIDER_SITE_OTHER): Payer: BC Managed Care – PPO | Admitting: Medical

## 2018-10-27 VITALS — BP 159/87 | HR 66 | Temp 99.4°F

## 2018-10-27 DIAGNOSIS — M5442 Lumbago with sciatica, left side: Secondary | ICD-10-CM | POA: Diagnosis not present

## 2018-10-27 MED ORDER — TRAMADOL-ACETAMINOPHEN 37.5-325 MG PO TABS: 1.0000 | ORAL_TABLET | Freq: Four times a day (QID) | ORAL | 0 refills | Status: DC | PRN

## 2018-10-27 NOTE — Progress Notes (Signed)
   Subjective:    Patient ID: Melinda Lopez, female    DOB: [birth year], 58 y.o.   MRN: [id number]  HPI  Virtual Visit via Telephone Note  I connected with Melinda Lopez on 10/27/18 at 11:20 AM EDT by telephone and verified that I am speaking with the correct person using two identifiers.  Location: Patient: home Provider: office   I discussed the limitations, risks, security and privacy concerns of performing an evaluation and management service by telephone and the availability of in person appointments. I also discussed with the patient that there may be a patient responsible charge related to this service. The patient expressed understanding and agreed to proceed.   History of Present Illness:   Pt states her lower back pain felt better with tx. But states that pain little more in left sciatic area. Sciatic area pain form yesterday is about 20% less. Pain level is 7/10 overall now. Pt is going to contact insurance to see if PT allowed. She is willing to go ahead and be referred to sports med.. Walking hurts. Some pain radiating down to calf area now.  Pt is on prednisone and flexeril.   Pt has some atherosclerosis on aorta on lumbar xray.   Observations/Objective: General- no acute distress, pleasant, oriented. Normal speech.  Assessment and Plan: For your  persisting lower back pain with sciatica despite taper prednisone and  Flexeril, we decided on using sports medicine MD.  I placed that referral and hopefully they will be able to see you sometime within the next week.  You might benefit from physical therapy as well.  Since your pain level is 7 out of 10 decided to go ahead and add tramadol with Tylenol.  You have used this of before and reported notes adverse side effects.  Hopefully this will give you some relief.  For history of hyperlipidemia and recent incidental reported atherosclerosis of aorta by x-ray, I recommend that you continue with current statin therapy  but also eat low-cholesterol diet.  Explained that your ultrasound shows minimal atherosclerosis of the aorta.  You could get copies of both lumbar x-ray and ultrasound abdomen report when you are in for your upcoming wellness exam.  Then you could discuss findings with your vascular specialist.  Follow-up as regular scheduled with PCP or as needed.  Follow Up Instructions:    I discussed the assessment and treatment plan with the patient. The patient was provided an opportunity to ask questions and all were answered. The patient agreed with the plan and demonstrated an understanding of the instructions.   The patient was advised to call back or seek an in-person evaluation if the symptoms worsen or if the condition fails to improve as anticipated.  I provided 25 minutes of non-face-to-face time during this encounter. Counseled pt on dx, tx and answered pt questions.   Mackie Pai, PA-C    Review of Systems     Objective:   Physical Exam        Assessment & Plan:

## 2018-10-27 NOTE — Patient Instructions (Signed)
For your  persisting lower back pain with sciatica despite taper prednisone and  Flexeril, we decided on using sports medicine MD.  I placed that referral and hopefully they will be able to see you sometime within the next week.  You might benefit from physical therapy as well.  Since your pain level is 7 out of 10 decided to go ahead and add tramadol with Tylenol.  You have used this of before and reported notes adverse side effects.  Hopefully this will give you some relief.  For history of hyperlipidemia and recent incidental reported atherosclerosis of aorta by x-ray, I recommend that you continue with current statin therapy but also eat low-cholesterol diet.  Explained that your ultrasound shows minimal atherosclerosis of the aorta.  You could get copies of both lumbar x-ray and ultrasound abdomen report when you are in for your upcoming wellness exam.  Then you could discuss findings with your vascular specialist.  Follow-up as regular scheduled with PCP or as needed.

## 2018-11-01 ENCOUNTER — Telehealth: Payer: Self-pay | Admitting: Medical

## 2018-11-01 ENCOUNTER — Encounter: Payer: Self-pay | Admitting: Medical

## 2018-11-01 DIAGNOSIS — M5442 Lumbago with sciatica, left side: Secondary | ICD-10-CM

## 2018-11-01 MED ORDER — PREDNISONE 10 MG PO TABS: ORAL_TABLET | ORAL | 0 refills | Status: DC

## 2018-11-01 NOTE — Telephone Encounter (Signed)
Placed referral to spine and scoliosis specialist.

## 2018-11-01 NOTE — Telephone Encounter (Signed)
Patient would like a follow up call today  Best # [phone]  Regarding orthopedic referral my chart message.

## 2018-11-22 ENCOUNTER — Ambulatory Visit (INDEPENDENT_AMBULATORY_CARE_PROVIDER_SITE_OTHER): Payer: BC Managed Care – PPO | Admitting: Family

## 2018-11-22 ENCOUNTER — Other Ambulatory Visit: Payer: Self-pay

## 2018-11-22 ENCOUNTER — Encounter: Payer: Self-pay | Admitting: Family

## 2018-11-22 VITALS — BP 128/83 | HR 64 | Temp 97.2°F | Resp 16 | Ht 67.0 in | Wt 203.0 lb

## 2018-11-22 DIAGNOSIS — Z0000 Encounter for general adult medical examination without abnormal findings: Secondary | ICD-10-CM

## 2018-11-22 DIAGNOSIS — I10 Essential (primary) hypertension: Secondary | ICD-10-CM

## 2018-11-22 DIAGNOSIS — E039 Hypothyroidism, unspecified: Secondary | ICD-10-CM | POA: Diagnosis not present

## 2018-11-22 DIAGNOSIS — G43909 Migraine, unspecified, not intractable, without status migrainosus: Secondary | ICD-10-CM

## 2018-11-22 DIAGNOSIS — R110 Nausea: Secondary | ICD-10-CM

## 2018-11-22 DIAGNOSIS — Z8679 Personal history of other diseases of the circulatory system: Secondary | ICD-10-CM

## 2018-11-22 DIAGNOSIS — D6859 Other primary thrombophilia: Secondary | ICD-10-CM

## 2018-11-22 LAB — HEPATIC FUNCTION PANEL
ALT: 42 U/L — ABNORMAL HIGH (ref 0–35)
AST: 36 U/L (ref 0–37)
Albumin: 4.6 g/dL (ref 3.5–5.2)
Alkaline Phosphatase: 58 U/L (ref 39–117)
Bilirubin, Direct: 0.1 mg/dL (ref 0.0–0.3)
Total Bilirubin: 0.5 mg/dL (ref 0.2–1.2)
Total Protein: 6.9 g/dL (ref 6.0–8.3)

## 2018-11-22 LAB — CBC WITH DIFFERENTIAL/PLATELET
Basophils Absolute: 0 10*3/uL (ref 0.0–0.1)
Basophils Relative: 0.6 % (ref 0.0–3.0)
Eosinophils Absolute: 0.1 10*3/uL (ref 0.0–0.7)
Eosinophils Relative: 2.1 % (ref 0.0–5.0)
HCT: 40.3 % (ref 36.0–46.0)
Hemoglobin: 13.7 g/dL (ref 12.0–15.0)
Lymphocytes Relative: 27.9 % (ref 12.0–46.0)
Lymphs Abs: 1.4 10*3/uL (ref 0.7–4.0)
MCHC: 33.9 g/dL (ref 30.0–36.0)
MCV: 96.7 fl (ref 78.0–100.0)
Monocytes Absolute: 0.3 10*3/uL (ref 0.1–1.0)
Monocytes Relative: 6.8 % (ref 3.0–12.0)
Neutro Abs: 3.1 10*3/uL (ref 1.4–7.7)
Neutrophils Relative %: 62.6 % (ref 43.0–77.0)
Platelets: 267 10*3/uL (ref 150.0–400.0)
RBC: 4.17 Mil/uL (ref 3.87–5.11)
RDW: 13.5 % (ref 11.5–15.5)
WBC: 5 10*3/uL (ref 4.0–10.5)

## 2018-11-22 LAB — BASIC METABOLIC PANEL
BUN: 9 mg/dL (ref 6–23)
CO2: 29 mEq/L (ref 19–32)
Calcium: 9.5 mg/dL (ref 8.4–10.5)
Chloride: 104 mEq/L (ref 96–112)
Creatinine, Ser: 0.67 mg/dL (ref 0.40–1.20)
GFR: 90.41 mL/min (ref >60.00)
Glucose, Bld: 106 mg/dL — ABNORMAL HIGH (ref 70–99)
Potassium: 3.9 mEq/L (ref 3.5–5.1)
Sodium: 140 mEq/L (ref 135–145)

## 2018-11-22 LAB — TSH: TSH: 0.63 u[IU]/mL (ref 0.35–4.50)

## 2018-11-22 LAB — LIPID PANEL
Cholesterol: 155 mg/dL (ref 0–200)
HDL: 58.4 mg/dL (ref >39.00)
LDL Cholesterol: 79 mg/dL (ref 0–99)
NonHDL: 96.54
Total CHOL/HDL Ratio: 3
Triglycerides: 86 mg/dL (ref 0.0–149.0)
VLDL: 17.2 mg/dL (ref 0.0–40.0)

## 2018-11-22 MED ORDER — LEVOTHYROXINE SODIUM 100 MCG PO TABS: 100.0000 ug | ORAL_TABLET | Freq: Every day | ORAL | 1 refills | Status: DC

## 2018-11-22 MED ORDER — AMLODIPINE BESYLATE 2.5 MG PO TABS: 2.5000 mg | ORAL_TABLET | Freq: Every day | ORAL | 1 refills | Status: DC

## 2018-11-22 MED ORDER — OMEPRAZOLE 40 MG PO CPDR: DELAYED_RELEASE_CAPSULE | ORAL | 1 refills | Status: DC

## 2018-11-22 NOTE — Patient Instructions (Addendum)
Preventive Care 40-58 Years Old, Female Preventive care refers to visits with your health care provider and lifestyle choices that can promote health and wellness. This includes:  A yearly physical exam. This may also be called an annual well check.  Regular dental visits and eye exams.  Immunizations.  Screening for certain conditions.  Healthy lifestyle choices, such as eating a healthy diet, getting regular exercise, not using drugs or products that contain nicotine and tobacco, and limiting alcohol use. What can I expect for my preventive care visit? Physical exam Your health care provider will check your:  Height and weight. This may be used to calculate body mass index (BMI), which tells if you are at a healthy weight.  Heart rate and blood pressure.  Skin for abnormal spots. Counseling Your health care provider may ask you questions about your:  Alcohol, tobacco, and drug use.  Emotional well-being.  Home and relationship well-being.  Sexual activity.  Eating habits.  Work and work environment.  Method of birth control.  Menstrual cycle.  Pregnancy history. What immunizations do I need?  Influenza (flu) vaccine  This is recommended every year. Tetanus, diphtheria, and pertussis (Tdap) vaccine  You may need a Td booster every 10 years. Varicella (chickenpox) vaccine  You may need this if you have not been vaccinated. Zoster (shingles) vaccine  You may need this after age 60. Measles, mumps, and rubella (MMR) vaccine  You may need at least one dose of MMR if you were born in 1957 or later. You may also need a second dose. Pneumococcal conjugate (PCV13) vaccine  You may need this if you have certain conditions and were not previously vaccinated. Pneumococcal polysaccharide (PPSV23) vaccine  You may need one or two doses if you smoke cigarettes or if you have certain conditions. Meningococcal conjugate (MenACWY) vaccine  You may need this if you  have certain conditions. Hepatitis A vaccine  You may need this if you have certain conditions or if you travel or work in places where you may be exposed to hepatitis A. Hepatitis B vaccine  You may need this if you have certain conditions or if you travel or work in places where you may be exposed to hepatitis B. Haemophilus influenzae type b (Hib) vaccine  You may need this if you have certain conditions. Human papillomavirus (HPV) vaccine  If recommended by your health care provider, you may need three doses over 6 months. You may receive vaccines as individual doses or as more than one vaccine together in one shot (combination vaccines). Talk with your health care provider about the risks and benefits of combination vaccines. What tests do I need? Blood tests  Lipid and cholesterol levels. These may be checked every 5 years, or more frequently if you are over 58 years old.  Hepatitis C test.  Hepatitis B test. Screening  Lung cancer screening. You may have this screening every year starting at age 58 if you have a 30-pack-year history of smoking and currently smoke or have quit within the past 15 years.  Colorectal cancer screening. All adults should have this screening starting at age 58 and continuing until age 75. Your health care provider may recommend screening at age 58 if you are at increased risk. You will have tests every 1-10 years, depending on your results and the type of screening test.  Diabetes screening. This is done by checking your blood sugar (glucose) after you have not eaten for a while (fasting). You may have this   done every 1-3 years.  Mammogram. This may be done every 1-2 years. Talk with your health care provider about when you should start having regular mammograms. This may depend on whether you have a family history of breast cancer.  BRCA-related cancer screening. This may be done if you have a family history of breast, ovarian, tubal, or peritoneal  cancers.  Pelvic exam and Pap test. This may be done every 3 years starting at age 58. Starting at age 58, this may be done every 5 years if you have a Pap test in combination with an HPV test. Other tests  Sexually transmitted disease (STD) testing.  Bone density scan. This is done to screen for osteoporosis. You may have this scan if you are at high risk for osteoporosis. Follow these instructions at home: Eating and drinking  Eat a diet that includes fresh fruits and vegetables, whole grains, lean protein, and low-fat dairy.  Take vitamin and mineral supplements as recommended by your health care provider.  Do not drink alcohol if: ? Your health care provider tells you not to drink. ? You are pregnant, may be pregnant, or are planning to become pregnant.  If you drink alcohol: ? Limit how much you have to 0-1 drink a day. ? Be aware of how much alcohol is in your drink. In the [place] one drink equals one 12 oz bottle of beer (355 mL), one 5 oz glass of wine (148 mL), or one 1 oz glass of hard liquor (44 mL). Lifestyle  Take daily care of your teeth and gums.  Stay active. Exercise for at least 30 minutes on 5 or more days each week.  Do not use any products that contain nicotine or tobacco, such as cigarettes, e-cigarettes, and chewing tobacco. If you need help quitting, ask your health care provider.  If you are sexually active, practice safe sex. Use a condom or other form of birth control (contraception) in order to prevent pregnancy and STIs (sexually transmitted infections).  If told by your health care provider, take low-dose aspirin daily starting at age 7. What's next?  Visit your health care provider once a year for a well check visit.  Ask your health care provider how often you should have your eyes and teeth checked.  Stay up to date on all vaccines. This information is not intended to replace advice given to you by your health care provider. Make sure you  discuss any questions you have with your health care provider. Document Released: 02/15/2015 Document Revised: 09/30/2017 Document Reviewed: 09/30/2017 Elsevier Patient Education  2020 [employer].

## 2018-11-22 NOTE — Progress Notes (Signed)
Subjective:    Patient ID: Melinda Lopez, female    DOB: April 21, 1960, 58 y.o.   MRN: [id number]  HPI  Patient presents today for complete physical.  Immunizations: tetanus 2012, flu shot today. Declines shingrix Diet: not eating healthy Wt Readings from Last 3 Encounters:  11/22/18 203 lb (92.1 kg)  10/25/18 198 lb (89.8 kg)  07/27/18 200 lb (90.7 kg)  Exercise: started walking recently Colonoscopy: 09/28/17 Dexa: 2018, normal Pap Smear: 06/15/17 Mammogram: due Vision: scheduled Dental: scheduled  Migraines- was given trial of imitrex last visit. Reports that she took it once and reports that she had some tunnel vision.  Reports that it did stop the migraine.  Reports that since that time her   Hyperlipidemia- maintained on statin.  Lab Results  Component Value Date   CHOL 150 06/15/2017   HDL 64.80 06/15/2017   LDLCALC 66 06/15/2017   TRIG 95.0 06/15/2017   CHOLHDL 2 06/15/2017   GERD- reports symptoms are well controlled on omeprazole. Reports that she is nauseous all the time.   Hypothyroid-  Lab Results  Component Value Date   TSH 3.32 12/08/2017   Performance anxiety- uses propranolol prn. Hasn't needed recently.   Hypercoaguable state- maintained on coumadin. Followed by coumadin clinic at [place] is for lifelong anticoagulation.  She sees Dr. Anders Simmonds ([facility]  Elevated blood pressure-  BP Readings from Last 3 Encounters:  11/22/18 128/83  10/27/18 (!) 159/87  10/25/18 (!) 153/92       Review of Systems  Constitutional: Negative for unexpected weight change.  HENT: Negative for hearing loss and rhinorrhea.   Eyes: Negative for visual disturbance.  Respiratory: Negative for cough and shortness of breath.   Cardiovascular: Negative for leg swelling.  Gastrointestinal: Negative for blood in stool, constipation and diarrhea.  Genitourinary: Negative for dysuria, frequency and hematuria.  Musculoskeletal: Positive for back  pain.  Skin: Negative for rash.  Neurological: Positive for headaches (some headaches).  Hematological: Negative for adenopathy.  Psychiatric/Behavioral:       Denies depression/anxiety    Past Medical History:  Diagnosis Date  . Carotid artery stenosis   . Chronic headaches   . Fatty liver 04/07/2016  . Hx of adenomatous polyp of colon 07/30/2011  . Hx of blood clots    fingers  . Hyperlipemia   . Hypertension   . Hypothyroidism   . Migraine 1982   pt states migraines since then.   Marland Kitchen PFO (patent foramen ovale)   . TIA (transient ischemic attack)      Social History   Socioeconomic History  . Marital status: Married    Spouse name: Not on file  . Number of children: 2  . Years of education: Not on file  . Highest education level: Not on file  Occupational History  . Not on file  Social Needs  . Financial resource strain: Not on file  . Food insecurity    Worry: Not on file    Inability: Not on file  . Transportation needs    Medical: Not on file    Non-medical: Not on file  Tobacco Use  . Smoking status: Former Smoker    Types: Cigarettes    Quit date: 07/29/2002    Years since quitting: 16.3  . Smokeless tobacco: Never Used  Substance and Sexual Activity  . Alcohol use: Yes    Alcohol/week: 0.0 standard drinks    Comment: 2 per day  . Drug use: No  .  Sexual activity: Not on file  Lifestyle  . Physical activity    Days per week: Not on file    Minutes per session: Not on file  . Stress: Not on file  Relationships  . Social Musicianconnections    Talks on phone: Not on file    Gets together: Not on file    Attends religious service: Not on file    Active member of club or organization: Not on file    Attends meetings of clubs or organizations: Not on file    Relationship status: Not on file  . Intimate partner violence    Fear of current or ex partner: Not on file    Emotionally abused: Not on file    Physically abused: Not on file    Forced sexual  activity: Not on file  Other Topics Concern  . 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 BREAST LUMPECTOMY Right 2000  . CAROTID ARTERY - SUBCLAVIAN ARTERY BYPASS GRAFT Right 2006  . COLONOSCOPY    . UPPER GASTROINTESTINAL ENDOSCOPY      Family History  Problem Relation Age of Onset  . Heart disease Mother   . Kidney disease Mother   . Heart disease Father   . Diabetes Sister   . Kidney disease Sister   . Heart disease Maternal Grandmother   . Heart murmur Brother     Allergies  Allergen Reactions  . Shellfish Allergy Anaphylaxis  . Codeine Nausea Only  . Morphine And Related Other (See Comments)    slurring words  . Phenergan [Promethazine] Other (See Comments)    Causes blood presser to bottom out  . Sulfa Antibiotics Rash    Current Outpatient Medications on File Prior to Visit  Medication Sig Dispense Refill  . amLODipine (NORVASC) 2.5 MG tablet Take 1 tablet (2.5 mg total) by mouth daily. 90 tablet 1  . atorvastatin (LIPITOR) 20 MG tablet Take 1 tablet (20 mg total) by mouth daily. 90 tablet 1  . EPINEPHrine 0.3 mg/0.3 mL IJ SOAJ injection Inject 0.3 mLs (0.3 mg total) into the muscle as needed for anaphylaxis. Use as needed for severe allergic reaction 2 Device 3  . EUTHYROX 100 MCG tablet Take 1 tablet by mouth once daily 30 tablet 1  . fluticasone (FLONASE) 50 MCG/ACT nasal spray Place 1 spray into both nostrils 2 (two) times daily. 16 g 6  . omeprazole (PRILOSEC) 40 MG capsule TAKE 1 CAPSULE BY MOUTH EVERY DAY 30 capsule 3  . SUMAtriptan (IMITREX) 50 MG tablet May repeat in 2 hours if headache persists or recurs. (max 2 tabs/24 hr) 10 tablet 0  . warfarin (COUMADIN) 10 MG tablet Take 10 mg by  mouth daily.    . cyclobenzaprine (FLEXERIL) 5 MG tablet 1 tab po q hs (Patient not taking: Reported on 11/22/2018) 7 tablet 0   No current facility-administered medications on file prior to visit.     BP 128/83 (BP Location: Right Arm, Patient Position: Sitting, Cuff Size: Small)   Pulse 64   Temp (!) 97.2 F (36.2 C) (Temporal)   Resp 16   Ht 5\' 7"  (1.702 m)   Wt 203 lb (92.1 kg)  SpO2 98%   BMI 31.79 kg/m       Objective:   Physical Exam  Physical Exam  Constitutional: She is oriented to person, place, and time. She appears well-developed and well-nourished. No distress.  HENT:  Head: Normocephalic and atraumatic.  Right Ear: Tympanic membrane and ear canal normal.  Left Ear: Tympanic membrane and ear canal normal.  Mouth/Throat: Not examined- pt wearing mask for covid-19 precautions Eyes: Pupils are equal, round, and reactive to light. No scleral icterus.  Neck: Normal range of motion. No thyromegaly present.  Cardiovascular: Normal rate and regular rhythm.   No murmur heard. Pulmonary/Chest: Effort normal and breath sounds normal. No respiratory distress. He has no wheezes. She has no rales. She exhibits no tenderness.  Abdominal: Soft. Bowel sounds are normal. She exhibits no distension and no mass. There is no tenderness. There is no rebound and no guarding.  Musculoskeletal: She exhibits no edema.  Lymphadenopathy:    She has no cervical adenopathy.  Neurological: She is alert and oriented to person, place, and time. She has normal patellar reflexes. She exhibits normal muscle tone. Coordination normal.  Skin: Skin is warm and dry.  Psychiatric: She has a normal mood and affect. Her behavior is normal. Judgment and thought content normal.  Breast: normal breast exam bilaterally, no palpable masses Pelvic: deferred          Assessment & Plan:   Preventative care- discussed healthy diet, exercise, weight loss.  Refer for mammogram.  Declines shingrix. Flu and  tetanus up to date.  dexa up to date. Pap up to date. Obtain routine lab work.   Hypothyroid- clinically stable on synthroid. Obtain follow up TSH.  HTN- BP looks good today.  She reports some higher bp readings at home. Advised her to continue to monitor her home blood pressure readings and to sent them to me next week for review and further recommendations. BP Readings from Last 3 Encounters:  11/22/18 128/83  10/27/18 (!) 159/87  10/25/18 (!) 153/92   Chronic nausea- continue PPI, recommended referral to GI, pt is agreeable.   Migraines- I suspect that her "tunnel vision" was secondary to her migraine rather than imitrex. Advised OK to try again if needed and to let me know if recurrent tunnel vision symptoms.  Hypercoagulable state- maintained on lifelong coumadin and is following with hematology and the coumadin clinic.   Hx of carotid artery stenosis- she plans to schedule follow up with her vascular specialist.      Assessment & Plan:

## 2018-11-23 ENCOUNTER — Encounter: Payer: Self-pay | Admitting: Family

## 2019-01-30 ENCOUNTER — Telehealth: Payer: Self-pay | Admitting: Family

## 2019-01-30 NOTE — Telephone Encounter (Signed)
Copied from [place] [phone]. Topic: Quick Communication - See Telephone Encounter >> Jan 30, 2019 10:31 AM Loma Boston wrote: CRM for notification. See Telephone encounter for: 01/30/19. Pt is upset about a billing error, Billing states that they can not help, Pt seems very frustrated .  Pt states she has left a message before and not been called back, Pt is requesting Call back from office admin. [phone]    Anderson Malta- please pt to discuss:  After reviewing Feighner's recent bills it appears her insurance plan has a deductible and coinsurance to anything outside of federally mandated preventative care or state mandated preventative care. So her visits on October 7 and November 8 with Percell Tatham went to her deductible. She did have a physical with Melissa on October 20 but she discussed in detail her migraines, her chronic nausea and her hypertension. Melissa even asked her to follow back up and send additional blood pressure readings in for her to review and did a GI referral so these are valid charges.   [facility] determined after review that the lipid lab she had drawn on the 20th at her physical was not patient responsibility and did adjust off that $39.  There are very few labs that are considered federally mandated or state mandated as preventative with these health plans. Especially if you have chronic medical conditions such as hypertension. She would be responsible for knowing those covered services.

## 2019-01-31 NOTE — Telephone Encounter (Signed)
Tried to call patient on her cell phone.  Left message on patient's cell voice mail stating I would call her back tomorrow regarding her billing questions/issues or she could call me at her convenience tomorrow to discuss.

## 2019-02-07 NOTE — Telephone Encounter (Signed)
Pt worried about going into collections. Victorino Dike states that Marchelle Folks was going to reach out to pt.   Pt states that 9/22 she had VV with Esperanza Richters, PA. She said 2 days later she was still in pain and the nurse called her and asked if she wanted to speak to the provider and she said yes. She did not know she was having another "office visit" and being billed again (another $260-270).   Then 10/20 she had annual visit with [facility] and said that she had a routine visit and answered questions that were asked her but that she was not having additional concerns but elaborated on the visit with Ramon Dredge in September. Pt said that she has never been billed a double visit with her physical before.   She said that discussing "updates" is part of the annual preventative exam and it seemed weird to get dinged for that again.  Best call back # [phone].

## 2019-02-14 NOTE — Telephone Encounter (Signed)
Spoke with patient and explained the reasoning behind the additional charges from her CPE in October. Explained to patient the provider addressed various concerns and placed a referral to GI for chronic nausea.  I went over at length the documentation  and that her health plan may not cover certain labs and deem them as necessary and she will need to look at her plan to verify coverage and stipulations regarding that.  Patient stated she understands tele-health is fairly new and has received great care from both [place] and Ramon Dredge from this office, but felt like she was misinformed by the nurse when she was asked if she wanted to "speak" to A[place] when she had continued pain after having a virtual visit with him on 10/25/18 and "spoke" to [place] on 10/27/18 and was later charged for another virtual visit.  At the end of our call, patient expressed understanding of CPEs and the mentioning of additional acute needs during a routine yearly physical and the consequences of occurring additional charges from this.

## 2019-04-04 ENCOUNTER — Encounter: Payer: Self-pay | Admitting: Family

## 2019-04-20 ENCOUNTER — Other Ambulatory Visit: Payer: Self-pay | Admitting: Family

## 2019-04-26 ENCOUNTER — Other Ambulatory Visit: Payer: Self-pay | Admitting: Family

## 2019-04-27 ENCOUNTER — Encounter: Payer: Self-pay | Admitting: Family Medicine

## 2019-05-20 ENCOUNTER — Other Ambulatory Visit: Payer: Self-pay | Admitting: Family

## 2019-07-07 ENCOUNTER — Encounter: Payer: Self-pay | Admitting: Family Medicine

## 2019-07-07 ENCOUNTER — Ambulatory Visit: Payer: Self-pay

## 2019-07-07 ENCOUNTER — Other Ambulatory Visit: Payer: Self-pay

## 2019-07-07 ENCOUNTER — Ambulatory Visit (INDEPENDENT_AMBULATORY_CARE_PROVIDER_SITE_OTHER): Payer: BC Managed Care – PPO | Admitting: Family Medicine

## 2019-07-07 VITALS — BP 131/85 | HR 56 | Ht 67.0 in | Wt 200.0 lb

## 2019-07-07 DIAGNOSIS — M7061 Trochanteric bursitis, right hip: Secondary | ICD-10-CM

## 2019-07-07 DIAGNOSIS — M19079 Primary osteoarthritis, unspecified ankle and foot: Secondary | ICD-10-CM

## 2019-07-07 MED ORDER — TRIAMCINOLONE ACETONIDE 40 MG/ML IJ SUSP
20.0000 mg | Freq: Once | INTRAMUSCULAR | Status: AC
  Administered 2019-07-07: 20 mg via INTRA_ARTICULAR

## 2019-07-07 MED ORDER — METHYLPREDNISOLONE ACETATE 40 MG/ML IJ SUSP
40.0000 mg | Freq: Once | INTRAMUSCULAR | Status: AC
  Administered 2019-07-07: 40 mg via INTRA_ARTICULAR

## 2019-07-07 NOTE — Patient Instructions (Signed)
Good to see you Please try ice  Please try the exercises   Please send me a message in MyChart with any questions or updates.  Please see me back in 4-6 weeks or as needed.   --Dr. Jordan Likes

## 2019-07-07 NOTE — Addendum Note (Signed)
Addended by: Kathi Simpers F on: 07/07/2019 09:26 AM   Modules accepted: Orders

## 2019-07-07 NOTE — Progress Notes (Signed)
Melinda Lopez - 59 y.o. female MRN [id number]  Date of birth: 01/16/1961  SUBJECTIVE:  Including CC & ROS.  Chief Complaint  Patient presents with  . Follow-up    right hip and left foot    Melinda Lopez is a 59 y.o. female that is presenting with acute exacerbating pain of the right lateral hip and left great toe.  The symptoms been getting worse over the past few weeks.  She has history of similar pain.  Denies any specific inciting event.  Left great toe pain is worse with going up and down stairs.  She has to do that for work.  The pain of the right hip is localized to the area.  She has had injections in these areas in the past which seem to improve her symptoms.   Review of Systems See HPI   HISTORY: Past Medical, Surgical, Social, and Family History Reviewed & Updated per EMR.   Pertinent Historical Findings include:  Past Medical History:  Diagnosis Date  . Carotid artery stenosis   . Chronic headaches   . Fatty liver 04/07/2016  . Hx of adenomatous polyp of colon 07/30/2011  . Hx of blood clots    fingers  . Hyperlipemia   . Hypertension   . Hypothyroidism   . Migraine 1982   pt states migraines since then.   Marland Kitchen PFO (patent foramen ovale)   . TIA (transient ischemic attack)     Past Surgical History:  Procedure Laterality Date  . BREAST LUMPECTOMY Right 2000  . CAROTID ARTERY - SUBCLAVIAN ARTERY BYPASS GRAFT Right 2006  . COLONOSCOPY    . UPPER GASTROINTESTINAL ENDOSCOPY      Family History  Problem Relation Age of Onset  . Heart disease Mother   . Kidney disease Mother   . Heart disease Father   . Diabetes Sister   . Kidney disease Sister   . Heart disease Maternal Grandmother   . Heart murmur Brother     Social History   Socioeconomic History  . Marital status: Married    Spouse name: Not on file  . Number of children: 2  . Years of education: Not on file  . Highest education level: Not on file  Occupational History  . Not on file    Tobacco Use  . Smoking status: Former Smoker    Types: Cigarettes    Quit date: 07/29/2002    Years since quitting: 16.9  . Smokeless tobacco: Never Used  Substance and Sexual Activity  . Alcohol use: Yes    Alcohol/week: 0.0 standard drinks    Comment: 2 per day  . Drug use: No  . Sexual activity: Not on file  Other Topics Concern  . 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 Difficulty of Paying Living Expenses:   Food Insecurity:   . Worried About Programme researcher, broadcasting/film/video in the Last Year:   . Barista in the Last Year:   Transportation Needs:   . Freight forwarder (Medical):   Marland Kitchen Lack of Transportation (Non-Medical):   Physical Activity:   . Days of Exercise per Week:   . Minutes of  Exercise per Session:   Stress:   . Feeling of Stress :   Social Connections:   . Frequency of Communication with Friends and Family:   . Frequency of Social Gatherings with Friends and Family:   . Attends Religious Services:   . Active Member of Clubs or Organizations:   . Attends Archivist Meetings:   Marland Kitchen Marital Status:   Intimate Partner Violence:   . Fear of Current or Ex-Partner:   . Emotionally Abused:   Marland Kitchen Physically Abused:   . Sexually Abused:      PHYSICAL EXAM:  VS: BP 131/85   Pulse (!) 56   Ht 5\' 7"  (1.702 m)   Wt 200 lb (90.7 kg)   BMI 31.32 kg/m  Physical Exam Gen: NAD, alert, cooperative with exam, well-appearing MSK:  Right hip: Tenderness to palpation over the greater trochanter. Weakness with hip abduction. Normal internal and external rotation. Normal strength resistance with hip flexion. Left foot: Degenerative changes of the first MTP joint. Limited  plantarflexion of the great toe. No swelling or ecchymosis. Tender palpation of the first MTP joint. Neurovascularly intact   Aspiration/Injection Procedure Note OLANDA BOUGHNER 1960-11-14  Procedure: Injection Indications: Right hip pain  Procedure Details Consent: Risks of procedure as well as the alternatives and risks of each were explained to the (patient/caregiver).  Consent for procedure obtained. Time Out: Verified patient identification, verified procedure, site/side was marked, verified correct patient position, special equipment/implants available, medications/allergies/relevent history reviewed, required imaging and test results available.  Performed.  The area was cleaned with iodine and alcohol swabs.    The right greater trochanteric bursa was injected using 1 cc's of 40 mg Depo-Medrol and 4 cc's of 0.25% bupivacaine with a 22 3 1/2" needle.  Ultrasound was used. Images were obtained in short views showing the injection.     A sterile dressing was applied.  Patient did tolerate procedure well.   Aspiration/Injection Procedure Note HILDA RYNDERS [birth year]  Procedure: Injection Indications: Left great toe pain  Procedure Details Consent: Risks of procedure as well as the alternatives and risks of each were explained to the (patient/caregiver).  Consent for procedure obtained. Time Out: Verified patient identification, verified procedure, site/side was marked, verified correct patient position, special equipment/implants available, medications/allergies/relevent history reviewed, required imaging and test results available.  Performed.  The area was cleaned with iodine and alcohol swabs.    The left first MTP joint was injected using 0.5 cc's of 40 mg Kenalog and 0.5 cc's of 0.25% bupivacaine with a 25 1 1/2" needle.  Ultrasound was used. Images were obtained in short views showing the injection.     A sterile dressing was applied.  Patient did tolerate  procedure well.     ASSESSMENT & PLAN:   Greater trochanteric bursitis of right hip Acute exacerbation of her trochanteric pain.  Likely related to her hip abduction weakness. -Injection. -Counseled on home exercise therapy and supportive care. -Could consider physical therapy.  Arthritis of metatarsophalangeal (MTP) joint of great toe Significant degenerative changes appreciated on ultrasound.  No crystalline deposition appreciated. -Injection. -Counseled on posting and support of the joint itself. -Could consider Pennsaid.

## 2019-07-07 NOTE — Assessment & Plan Note (Signed)
Significant degenerative changes appreciated on ultrasound.  No crystalline deposition appreciated. -Injection. -Counseled on posting and support of the joint itself. -Could consider Pennsaid.

## 2019-07-07 NOTE — Assessment & Plan Note (Signed)
Acute exacerbation of her trochanteric pain.  Likely related to her hip abduction weakness. -Injection. -Counseled on home exercise therapy and supportive care. -Could consider physical therapy.

## 2019-08-01 ENCOUNTER — Encounter: Payer: Self-pay | Admitting: Family

## 2019-08-01 ENCOUNTER — Other Ambulatory Visit: Payer: Self-pay | Admitting: Family

## 2019-08-01 MED ORDER — EPINEPHRINE 0.3 MG/0.3ML IJ SOAJ: 0.3000 mg | INTRAMUSCULAR | 2 refills | Status: DC | PRN

## 2019-08-25 ENCOUNTER — Other Ambulatory Visit: Payer: Self-pay | Admitting: Family

## 2019-08-29 ENCOUNTER — Other Ambulatory Visit: Payer: Self-pay | Admitting: Family

## 2019-10-07 ENCOUNTER — Telehealth: Payer: Self-pay | Admitting: Family

## 2019-10-10 NOTE — Telephone Encounter (Signed)
I wanted to see her back for 6 month follow up of blood pressure etc back in April.  We can do a follow up visit first this month and then schedule her a physical at a later date.

## 2019-10-11 NOTE — Telephone Encounter (Signed)
Called patient but no answer and mail box was full. Sent Mychart message with this information and advising patient to call for follow up next week.

## 2019-10-20 ENCOUNTER — Other Ambulatory Visit: Payer: Self-pay

## 2019-10-20 ENCOUNTER — Telehealth (INDEPENDENT_AMBULATORY_CARE_PROVIDER_SITE_OTHER): Payer: BC Managed Care – PPO | Admitting: Family

## 2019-10-20 ENCOUNTER — Encounter: Payer: Self-pay | Admitting: Family

## 2019-10-20 VITALS — BP 128/73 | HR 67

## 2019-10-20 DIAGNOSIS — F418 Other specified anxiety disorders: Secondary | ICD-10-CM

## 2019-10-20 DIAGNOSIS — E785 Hyperlipidemia, unspecified: Secondary | ICD-10-CM

## 2019-10-20 DIAGNOSIS — E039 Hypothyroidism, unspecified: Secondary | ICD-10-CM | POA: Diagnosis not present

## 2019-10-20 DIAGNOSIS — I10 Essential (primary) hypertension: Secondary | ICD-10-CM

## 2019-10-20 DIAGNOSIS — K219 Gastro-esophageal reflux disease without esophagitis: Secondary | ICD-10-CM | POA: Diagnosis not present

## 2019-10-20 MED ORDER — PROPRANOLOL HCL 40 MG PO TABS: ORAL_TABLET | ORAL | Status: DC

## 2019-10-20 MED ORDER — LEVOTHYROXINE SODIUM 100 MCG PO TABS: 100.0000 ug | ORAL_TABLET | Freq: Every day | ORAL | 1 refills | Status: DC

## 2019-10-20 MED ORDER — ATORVASTATIN CALCIUM 20 MG PO TABS: 20.0000 mg | ORAL_TABLET | Freq: Every day | ORAL | 1 refills | Status: DC

## 2019-10-20 MED ORDER — SUMATRIPTAN SUCCINATE 50 MG PO TABS: ORAL_TABLET | ORAL | 0 refills | Status: DC

## 2019-10-20 MED ORDER — AMLODIPINE BESYLATE 2.5 MG PO TABS: 2.5000 mg | ORAL_TABLET | Freq: Every day | ORAL | 1 refills | Status: DC

## 2019-10-20 NOTE — Progress Notes (Signed)
Virtual Visit via Video Note  I connected with Melinda Lopez on 10/20/19 at  8:00 AM EDT by a video enabled telemedicine application and verified that I am speaking with the correct person using two identifiers.  Location: Patient: home Provider: work   I discussed the limitations of evaluation and management by telemedicine and the availability of in person appointments. The patient expressed understanding and agreed to proceed. Only the patient and myself were present for today's video call.   History of Present Illness:  Patient is a 59 yr old female who presents today for follow up.   Migraines- has not needed imitrex in the last 1 year.  She reports that tylenol and rest seem to resolve her headaches.    GERD- not taking omeprazole. Uses pepcid prn OTC. Reports symptoms have been stable.  Hyperlipidemia- maintained on statin.  Lab Results  Component Value Date   CHOL 155 11/22/2018   HDL 58.40 11/22/2018   LDLCALC 79 11/22/2018   TRIG 86.0 11/22/2018   CHOLHDL 3 11/22/2018   Completed pfizer series.   HTN- maintained on amlodipine.  BP Readings from Last 3 Encounters:  10/20/19 128/73  07/07/19 131/85  11/22/18 128/83   Hypothyroid-  Weight 194 Wt Readings from Last 3 Encounters:  07/07/19 200 lb (90.7 kg)  11/22/18 203 lb (92.1 kg)  10/25/18 198 lb (89.8 kg)  She does report + fatigue.  Reports that she walks 3-4 times a week for 1 hr.   Lab Results  Component Value Date   TSH 0.63 11/22/2018   Performance anxiety- has not been using propranolol.     Observations/Objective:   Gen: Awake, alert, no acute distress Resp: Breathing is even and non-labored Psych: calm/pleasant demeanor Neuro: Alert and Oriented x 3, + facial symmetry, speech is clear.   Assessment and Plan:  HTN-  bp stable on current dose of amlodipine. Continue same.  Hypothyroid- notes fatigue.  Check TSH.   Hyperlipidemia- maintained on statin. Obtain follow up  TSH.  Migraines- stable. Has not needed imitrex but would like to have on hand just in case.  GERD- stable with prn use of pepcid.  Performance anxiety- has not tried the propranolol but has it on hand for PRN use.   This visit occurred during the SARS-CoV-2 public health emergency.  Safety protocols were in place, including screening questions prior to the visit, additional usage of staff PPE, and extensive cleaning of exam room while observing appropriate contact time as indicated for disinfecting solutions.     Follow Up Instructions:    I discussed the assessment and treatment plan with the patient. The patient was provided an opportunity to ask questions and all were answered. The patient agreed with the plan and demonstrated an understanding of the instructions.   The patient was advised to call back or seek an in-person evaluation if the symptoms worsen or if the condition fails to improve as anticipated.  Lemont Fillers, NP

## 2019-10-24 ENCOUNTER — Other Ambulatory Visit: Payer: BC Managed Care – PPO

## 2019-10-26 ENCOUNTER — Other Ambulatory Visit (INDEPENDENT_AMBULATORY_CARE_PROVIDER_SITE_OTHER): Payer: BC Managed Care – PPO

## 2019-10-26 ENCOUNTER — Other Ambulatory Visit: Payer: Self-pay

## 2019-10-26 DIAGNOSIS — I10 Essential (primary) hypertension: Secondary | ICD-10-CM

## 2019-10-26 DIAGNOSIS — E039 Hypothyroidism, unspecified: Secondary | ICD-10-CM

## 2019-10-26 DIAGNOSIS — E785 Hyperlipidemia, unspecified: Secondary | ICD-10-CM

## 2019-10-27 ENCOUNTER — Other Ambulatory Visit: Payer: Self-pay | Admitting: Family

## 2019-10-27 ENCOUNTER — Encounter: Payer: Self-pay | Admitting: Family

## 2019-10-27 LAB — COMPREHENSIVE METABOLIC PANEL
AG Ratio: 1.9 (calc) (ref 1.0–2.5)
ALT: 42 U/L — ABNORMAL HIGH (ref 6–29)
AST: 35 U/L (ref 10–35)
Albumin: 4.3 g/dL (ref 3.6–5.1)
Alkaline phosphatase (APISO): 67 U/L (ref 37–153)
BUN: 12 mg/dL (ref 7–25)
CO2: 27 mmol/L (ref 20–32)
Calcium: 9.2 mg/dL (ref 8.6–10.4)
Chloride: 107 mmol/L (ref 98–110)
Creat: 0.8 mg/dL (ref 0.50–1.05)
Globulin: 2.3 g/dL (calc) (ref 1.9–3.7)
Glucose, Bld: 102 mg/dL — ABNORMAL HIGH (ref 65–99)
Potassium: 4.4 mmol/L (ref 3.5–5.3)
Sodium: 143 mmol/L (ref 135–146)
Total Bilirubin: 0.3 mg/dL (ref 0.2–1.2)
Total Protein: 6.6 g/dL (ref 6.1–8.1)

## 2019-10-27 LAB — TSH: TSH: 1.64 mIU/L (ref 0.40–4.50)

## 2019-10-27 LAB — LIPID PANEL
Cholesterol: 135 mg/dL (ref <200)
HDL: 62 mg/dL (ref >=50)
LDL Cholesterol (Calc): 60 mg/dL (calc)
Non-HDL Cholesterol (Calc): 73 mg/dL (calc) (ref <130)
Total CHOL/HDL Ratio: 2.2 (calc) (ref <5.0)
Triglycerides: 46 mg/dL (ref <150)

## 2019-10-27 MED ORDER — AMOXICILLIN 500 MG PO CAPS: 500.0000 mg | ORAL_CAPSULE | Freq: Three times a day (TID) | ORAL | 0 refills | Status: DC

## 2019-11-12 ENCOUNTER — Other Ambulatory Visit: Payer: Self-pay | Admitting: Family

## 2019-12-08 ENCOUNTER — Telehealth (INDEPENDENT_AMBULATORY_CARE_PROVIDER_SITE_OTHER): Payer: BC Managed Care – PPO | Admitting: Medical

## 2019-12-08 ENCOUNTER — Encounter: Payer: Self-pay | Admitting: Medical

## 2019-12-08 ENCOUNTER — Other Ambulatory Visit: Payer: Self-pay

## 2019-12-08 VITALS — BP 127/71 | HR 76 | Wt 194.0 lb

## 2019-12-08 DIAGNOSIS — J0100 Acute maxillary sinusitis, unspecified: Secondary | ICD-10-CM

## 2019-12-08 DIAGNOSIS — R059 Cough, unspecified: Secondary | ICD-10-CM

## 2019-12-08 MED ORDER — FLUTICASONE PROPIONATE 50 MCG/ACT NA SUSP: 1.0000 | Freq: Two times a day (BID) | NASAL | 6 refills | Status: DC

## 2019-12-08 MED ORDER — AMOXICILLIN-POT CLAVULANATE 875-125 MG PO TABS: 1.0000 | ORAL_TABLET | Freq: Two times a day (BID) | ORAL | 0 refills | Status: DC

## 2019-12-08 MED ORDER — BENZONATATE 100 MG PO CAPS: 100.0000 mg | ORAL_CAPSULE | Freq: Three times a day (TID) | ORAL | 0 refills | Status: DC | PRN

## 2019-12-08 NOTE — Progress Notes (Signed)
   Subjective:    Patient ID: Melinda Lopez, female    DOB: 1960/09/10, 59 y.o.   MRN: [id number]  HPI  Virtual Visit via Video Note  I connected with Melinda Lopez on 12/08/19 at  4:20 PM EDT by a video enabled telemedicine application and verified that I am speaking with the correct person using two identifiers.  Location: Patient: home Provider: office  No temp checked. But subjective low grade.   I discussed the limitations of evaluation and management by telemedicine and the availability of in person appointments. The patient expressed understanding and agreed to proceed.  History of Present Illness: Pt in with 8 days of sinus pressure and nasal congestion. Blowing out some colored mucus. Started out clear mucus.  Pt has been on sudafed PE, mucinex d and x-clear.  She had 2 negative rapid  covid test  Pt get sinus infection about 2 times a year.   Pt is traveling on Monday.  Pt last inr was 1.8 today. 2 days before it was 4. Pt aware of antibiotic effect on Inr. She expresses she needs to be on amoxicillin or differented antibioic. Offered keflex but she states has not done well in past when took.  Pt is going to get repeat inr in one week with hr inr clinic.     Observations/Objective:  General-no acute distress, pleasant, oriented. Lungs- on inspection lungs appear unlabored. Neck- no tracheal deviation or jvd on inspection. Neuro- gross motor function appears intact. heent- frontal and maxillary sinus pressure.   Assessment and Plan: You appear to have a sinus infection. I am prescribing augmentin antibiotic for the infection. To help with the nasal congestion I prescribed flonase  nasal steroid. For your associated cough, I prescribed  benzonatate cough medicine.  Rest, hydrate, tylenol for fever.  Make sure you keep INR recheck appointment.  Follow up in 7 days or as needed.  Esperanza Richters, PA-C  Follow Up Instructions:    I discussed the  assessment and treatment plan with the patient. The patient was provided an opportunity to ask questions and all were answered. The patient agreed with the plan and demonstrated an understanding of the instructions.   The patient was advised to call back or seek an in-person evaluation if the symptoms worsen or if the condition fails to improve as anticipated.  Time spent with patient today was 25  minutes which consisted of chart review, discussing diagnosis treatment and documentation.   Esperanza Richters, PA-C   Review of Systems     Objective:   Physical Exam        Assessment & Plan:

## 2019-12-08 NOTE — Patient Instructions (Signed)
You appear to have a sinus infection. I am prescribing augmentin antibiotic for the infection. To help with the nasal congestion I prescribed flonase  nasal steroid. For your associated cough, I prescribed  benzonatate cough medicine.  Rest, hydrate, tylenol for fever.  Make sure you keep INR recheck appointment.  Follow up in 7 days or as needed.

## 2019-12-16 ENCOUNTER — Emergency Department (HOSPITAL_BASED_OUTPATIENT_CLINIC_OR_DEPARTMENT_OTHER)
Admission: EM | Admit: 2019-12-16 | Discharge: 2019-12-16 | Disposition: A | Discharge status: Home / Self Care | Payer: BC Managed Care – PPO | Attending: Emergency Medicine | Admitting: Emergency Medicine

## 2019-12-16 ENCOUNTER — Emergency Department (HOSPITAL_BASED_OUTPATIENT_CLINIC_OR_DEPARTMENT_OTHER): Payer: BC Managed Care – PPO

## 2019-12-16 ENCOUNTER — Encounter (HOSPITAL_BASED_OUTPATIENT_CLINIC_OR_DEPARTMENT_OTHER): Payer: Self-pay

## 2019-12-16 ENCOUNTER — Other Ambulatory Visit: Payer: Self-pay

## 2019-12-16 DIAGNOSIS — Z7901 Long term (current) use of anticoagulants: Secondary | ICD-10-CM | POA: Diagnosis not present

## 2019-12-16 DIAGNOSIS — S0990XA Unspecified injury of head, initial encounter: Secondary | ICD-10-CM | POA: Diagnosis present

## 2019-12-16 DIAGNOSIS — S0003XA Contusion of scalp, initial encounter: Secondary | ICD-10-CM

## 2019-12-16 DIAGNOSIS — Y92009 Unspecified place in unspecified non-institutional (private) residence as the place of occurrence of the external cause: Secondary | ICD-10-CM | POA: Insufficient documentation

## 2019-12-16 DIAGNOSIS — W19XXXA Unspecified fall, initial encounter: Secondary | ICD-10-CM | POA: Diagnosis not present

## 2019-12-16 DIAGNOSIS — Z87891 Personal history of nicotine dependence: Secondary | ICD-10-CM | POA: Diagnosis not present

## 2019-12-16 DIAGNOSIS — I10 Essential (primary) hypertension: Secondary | ICD-10-CM | POA: Insufficient documentation

## 2019-12-16 DIAGNOSIS — E039 Hypothyroidism, unspecified: Secondary | ICD-10-CM | POA: Diagnosis not present

## 2019-12-16 NOTE — ED Triage Notes (Signed)
Pt arrives ambulatory after falling from a step stool today (about 3 feet) pt reports hitting her head, denies LOC is on warfarin. Bleeding is controlled at this time on posterior head.

## 2019-12-16 NOTE — Discharge Instructions (Signed)
You were seen in the Emergency Department (ED) today for a head injury.  Your CT scan of the head and neck today was normal. You do have a scalp hematoma and can apply ice to help decrease swelling.   Symptoms to expect from a concussion include nausea, mild to moderate headache, difficulty concentrating or sleeping, and mild lightheadedness.  These symptoms should improve over the next few days to weeks, but it may take many weeks before you feel back to normal.  Return to the emergency department or follow-up with your primary care doctor if your symptoms are not improving over this time.  Signs of a more serious head injury include vomiting, severe headache, excessive sleepiness or confusion, and weakness or numbness in your face, arms or legs.  Return immediately to the Emergency Department if you experience any of these more concerning symptoms.    Rest, avoid strenuous physical or mental activity, and avoid activities that could potentially result in another head injury until all your symptoms from this head injury are completely resolved for at least 2-3 weeks.  If you participate in sports, get cleared by your doctor or trainer before returning to play.  You may take ibuprofen or acetaminophen over the counter according to label instructions for mild headache or scalp soreness.

## 2019-12-16 NOTE — ED Provider Notes (Signed)
Emergency Department Provider Note   I have reviewed the triage vital signs and the nursing notes.   HISTORY  Chief Complaint Fall   HPI Melinda Lopez is a 59 y.o. female with PMH reviewed below with PFO/TIA history on Coumadin presents to the ED after a mechanical fall at home.  Patient was up approximately 3 feet stripping wallpaper when she lost her footing and fell backwards.  She is unsure if she struck a doorknob or the floor but did hit the left posterior/parietal scalp and had some bleeding.  There is no loss of consciousness.  Patient is on Coumadin.  Bleeding was controlled with holding pressure and patient presents to the emergency department for evaluation.  She is not experiencing any weakness or numbness in the arms or legs.  No confusion, vomiting, or sleepiness since the fall.  No radiation of symptoms or other modifying factors.   Past Medical History:  Diagnosis Date  . Carotid artery stenosis   . Chronic headaches   . Fatty liver 04/07/2016  . Hx of adenomatous polyp of colon 07/30/2011  . Hx of blood clots    fingers  . Hyperlipemia   . Hypertension   . Hypothyroidism   . Migraine 1982   pt states migraines since then.   Marland Kitchen PFO (patent foramen ovale)   . TIA (transient ischemic attack)     Patient Active Problem List   Diagnosis Date Noted  . Arthritis of metatarsophalangeal (MTP) joint of great toe 07/20/2018  . Anti-phospholipid antibody syndrome (HCC) 06/16/2017  . 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  . Greater trochanteric bursitis of right hip 02/28/2015  . Patellofemoral syndrome 02/28/2015  . M[place] term current use of anticoagulant 02/25/2015  . Hypercoagulable state (HCC) 02/25/2015  . Fatty liver 05/29/2014  . Preventative health care 04/12/2014  . History of blood clots 03/15/2014  . Patent foramen ovale 03/15/2014  . History of carotid artery stenosis 03/15/2014  . Migraines 03/15/2014  . Hypothyroidism 03/15/2014  . Hyperlipidemia 03/15/2014   . Hx of adenomatous polyp of colon 07/30/2011    Past Surgical History:  Procedure Laterality Date  . BREAST LUMPECTOMY Right 2000  . CAROTID ARTERY - SUBCLAVIAN ARTERY BYPASS GRAFT Right 2006  . COLONOSCOPY    . UPPER GASTROINTESTINAL ENDOSCOPY      Allergies Shellfish allergy, Codeine, Morphine and related, Phenergan [promethazine], and Sulfa antibiotics  Family History  Problem Relation Age of Onset  . Heart disease Mother   . Kidney disease Mother   . Heart disease Father   . Diabetes Sister   . Kidney disease Sister   . Heart disease Maternal Grandmother   . Heart murmur Brother     Social History Social History   Tobacco Use  . Smoking status: Former Smoker    Types: Cigarettes    Quit date: 07/29/2002    Years since quitting: 17.3  . Smokeless tobacco: Never Used  Substance Use Topics  . Alcohol use: Yes    Alcohol/week: 0.0 standard drinks    Comment: 2 per day  . Drug use: No    Review of Systems  Constitutional: No fever/chills Eyes: No visual changes. ENT: No sore throat. Cardiovascular: Denies chest pain. Respiratory: Denies shortness of breath. Gastrointestinal: No abdominal pain.  No nausea, no vomiting.  No diarrhea.  No constipation. Genitourinary: Negative for dysuria. Musculoskeletal: Negative for back pain. Skin: Bleeding form posterior scalp.  Neurological: Negative for focal weakness or numbness. Positive HA.   10-point ROS otherwise  negative.  ____________________________________________   PHYSICAL EXAM:  VITAL SIGNS: ED Triage Vitals  Enc Vitals Group     BP 12/16/19 1507 (!) 151/96     Pulse Rate 12/16/19 1504 81     Resp 12/16/19 1504 18     Temp 12/16/19 1504 98.9 F (37.2 C)     Temp Source 12/16/19 1504 Oral     SpO2 12/16/19 1504 97 %     Weight 12/16/19 1504 196 lb (88.9 kg)     Height 12/16/19 1504 5\' 7"  (1.702 m)   Constitutional: Alert and oriented. Well appearing and in no acute distress. Eyes: Conjunctivae  are normal. Head: Posterior and lateral parietal scalp hematoma with abrasion. No active bleeding. No laceration.  Nose: No congestion/rhinnorhea. Mouth/Throat: Mucous membranes are moist.  Neck: No stridor.   Cardiovascular: Good peripheral circulation.  Respiratory: Normal respiratory effort.   Gastrointestinal: No distention.  Musculoskeletal: No gross deformities of extremities. Neurologic:  Normal speech and language. No gross focal neurologic deficits are appreciated.  Skin:  Skin is warm, dry and intact. No rash noted.  ____________________________________________  RADIOLOGY  CT Head Wo Contrast  Result Date: 12/16/2019 CLINICAL DATA:  Head trauma.  Abnormal mental status. EXAM: CT HEAD WITHOUT CONTRAST TECHNIQUE: Contiguous axial images were obtained from the base of the skull through the vertex without intravenous contrast. COMPARISON:  None. FINDINGS: Brain: No evidence of acute infarction, hemorrhage, hydrocephalus, extra-axial collection or mass lesion/mass effect. Vascular: Calcific atherosclerotic disease of the intra cavernous carotid arteries. Skull: Normal. Negative for fracture or focal lesion. Sinuses/Orbits: Near complete opacification of the bilateral maxillary sinuses. Partial opacification of the ethmoid sinuses. Other: Left parietal scalp hematoma. IMPRESSION: 1. No acute intracranial abnormality. 2. Left parietal scalp hematoma. 3. Chronic sinusitis. Electronically Signed   By: 12/18/2019 M.D.   On: 12/16/2019 15:40   CT Cervical Spine Wo Contrast  Result Date: 12/16/2019 CLINICAL DATA:  Status post fall with neck trauma. EXAM: CT CERVICAL SPINE WITHOUT CONTRAST TECHNIQUE: Multidetector CT imaging of the cervical spine was performed without intravenous contrast. Multiplanar CT image reconstructions were also generated. COMPARISON:  None. FINDINGS: Alignment: Normal. Skull base and vertebrae: No acute fracture. No primary bone lesion or focal pathologic  process. Soft tissues and spinal canal: No prevertebral fluid or swelling. No visible canal hematoma. Disc levels:  Normal. Upper chest: Negative. Other: Probable right carotid artery stent. IMPRESSION: No evidence of acute traumatic injury to the cervical spine. Electronically Signed   By: 12/18/2019 M.D.   On: 12/16/2019 15:44    ____________________________________________   PROCEDURES  Procedure(s) performed:   Procedures  None  ____________________________________________   INITIAL IMPRESSION / ASSESSMENT AND PLAN / ED COURSE  Pertinent labs & imaging results that were available during my care of the patient were reviewed by me and considered in my medical decision making (see chart for details).   Patient presents to the emergency department after head injury on Coumadin.  She has a scalp hematoma but no laceration requiring repair.  Wound is hemostatic.  Patient's next INR check is Tuesday.  She is not having bleeding symptoms to suspect a significantly abnormal INR and I believe she can wait for her routine labs this week.  No concussion symptoms currently but discussed that concussion symptoms can develop in the next several days and if they do she will follow with her PCP specifically for that issue.  CT imaging of the head and cervical spine obtained in triage which were reviewed  and negative for any acute process.  Discussed strict ED return precautions along with very low risk of delayed bleed on Coumadin but symptoms of that should this develop.    ____________________________________________  FINAL CLINICAL IMPRESSION(S) / ED DIAGNOSES  Final diagnoses:  Fall, initial encounter  Injury of head, initial encounter  Hematoma of scalp, initial encounter  Anticoagulated     Note:  This document was prepared using Dragon voice recognition software and may include unintentional dictation errors.  Alona Bene, MD, [facility]    Armas Mcbee, Arlyss Repress,  MD 12/16/19 [phone]

## 2020-01-11 ENCOUNTER — Other Ambulatory Visit: Payer: Self-pay | Admitting: Family

## 2020-01-12 ENCOUNTER — Encounter: Payer: Self-pay | Admitting: Family

## 2020-01-17 ENCOUNTER — Telehealth: Payer: Self-pay | Admitting: Family

## 2020-01-17 ENCOUNTER — Ambulatory Visit: Payer: BC Managed Care – PPO | Attending: Internal Medicine

## 2020-01-17 ENCOUNTER — Other Ambulatory Visit (HOSPITAL_COMMUNITY): Payer: Self-pay | Admitting: Internal Medicine

## 2020-01-17 DIAGNOSIS — Z23 Encounter for immunization: Secondary | ICD-10-CM

## 2020-01-17 NOTE — Telephone Encounter (Signed)
See mychart.  

## 2020-01-17 NOTE — Progress Notes (Signed)
° °  Covid-19 Vaccination Clinic  Name:  Melinda Lopez    MRN: [id number] DOB: 1960/08/15  01/17/2020  [name] was observed post Covid-19 immunization for 15 minutes without incident. She was provided with Vaccine Information Sheet and instruction to access the V-Safe system.   [name] was instructed to call 911 with any severe reactions post vaccine:  Difficulty breathing   Swelling of face and throat   A fast heartbeat   A bad rash all over body   Dizziness and weakness   Immunizations Administered    Name Date Dose VIS Date Route   Pfizer COVID-19 Vaccine 01/17/2020 10:37 AM 0.3 mL 11/22/2019 Intramuscular   Manufacturer: [employer], [employer]   Lot: [id number]   NDC: [address]-4

## 2020-01-20 ENCOUNTER — Ambulatory Visit: Payer: BC Managed Care – PPO

## 2020-01-23 ENCOUNTER — Ambulatory Visit: Payer: BC Managed Care – PPO

## 2020-02-09 ENCOUNTER — Other Ambulatory Visit: Payer: Self-pay | Admitting: Family

## 2020-02-10 ENCOUNTER — Encounter: Payer: Self-pay | Admitting: Family

## 2020-02-10 ENCOUNTER — Other Ambulatory Visit: Payer: Self-pay | Admitting: Family

## 2020-03-25 ENCOUNTER — Other Ambulatory Visit: Payer: Self-pay | Admitting: Family

## 2020-04-27 ENCOUNTER — Other Ambulatory Visit: Payer: Self-pay | Admitting: Family

## 2020-04-29 LAB — HM MAMMOGRAPHY

## 2020-04-30 ENCOUNTER — Encounter: Payer: Self-pay | Admitting: Family

## 2020-05-01 ENCOUNTER — Encounter: Payer: Self-pay | Admitting: Family

## 2020-05-01 ENCOUNTER — Other Ambulatory Visit: Payer: Self-pay

## 2020-05-01 ENCOUNTER — Ambulatory Visit (HOSPITAL_BASED_OUTPATIENT_CLINIC_OR_DEPARTMENT_OTHER)
Admission: RE | Admit: 2020-05-01 | Discharge: 2020-05-01 | Disposition: A | Discharge status: Home / Self Care | Payer: BC Managed Care – PPO | Source: Ambulatory Visit | Attending: Family | Admitting: Family

## 2020-05-01 ENCOUNTER — Ambulatory Visit: Payer: BC Managed Care – PPO | Admitting: Family

## 2020-05-01 ENCOUNTER — Ambulatory Visit (INDEPENDENT_AMBULATORY_CARE_PROVIDER_SITE_OTHER): Payer: BC Managed Care – PPO | Admitting: Family

## 2020-05-01 VITALS — BP 118/78 | HR 57 | Temp 97.8°F | Resp 17 | Ht 67.0 in | Wt 200.0 lb

## 2020-05-01 DIAGNOSIS — Z20822 Contact with and (suspected) exposure to covid-19: Secondary | ICD-10-CM

## 2020-05-01 DIAGNOSIS — I6529 Occlusion and stenosis of unspecified carotid artery: Secondary | ICD-10-CM | POA: Diagnosis not present

## 2020-05-01 DIAGNOSIS — M5416 Radiculopathy, lumbar region: Secondary | ICD-10-CM

## 2020-05-01 DIAGNOSIS — E039 Hypothyroidism, unspecified: Secondary | ICD-10-CM | POA: Diagnosis not present

## 2020-05-01 DIAGNOSIS — R0989 Other specified symptoms and signs involving the circulatory and respiratory systems: Secondary | ICD-10-CM

## 2020-05-01 LAB — SARS-COV-2 IGG: SARS-COV-2 IgG: 5.67

## 2020-05-01 LAB — TSH: TSH: 0.79 u[IU]/mL (ref 0.35–4.50)

## 2020-05-01 MED ORDER — ALPRAZOLAM 0.5 MG PO TABS: ORAL_TABLET | ORAL | 0 refills | Status: DC

## 2020-05-01 MED ORDER — METHYLPREDNISOLONE 4 MG PO TBPK: ORAL_TABLET | ORAL | 0 refills | Status: DC

## 2020-05-01 NOTE — Progress Notes (Signed)
Subjective:    Patient ID: Melinda Lopez, female    DOB: [birth year], 60 y.o.   MRN: [id number]  HPI  Patient is a 60 yr old female who presents today with chief complaint of bilateral "hip pain."  She does have some low back pain and the pain radiates into her hips and then down the front of her thighs. She does note some low back pain "off and on."  Denies loss of bowel or bladder control.  No falls.   Reports area of numbness on the left lateral thigh.  Reports that she went to [facility] in [facility], and had to use a hoveround due to leg fatigue.  She did take a motrin one day (which she new she should not), and noted that it helped considerably.   Review of Systems See HPI  Past Medical History:  Diagnosis Date  . Carotid artery stenosis   . Chronic headaches   . Fatty liver 04/07/2016  . Hx of adenomatous polyp of colon 07/30/2011  . Hx of blood clots    fingers  . Hyperlipemia   . Hypertension   . Hypothyroidism   . Migraine 1982   pt states migraines since then.   Marland Kitchen PFO (patent foramen ovale)   . TIA (transient ischemic attack)      Social History   Socioeconomic History  . Marital status: Married    Spouse name: Not on file  . Number of children: 2  . Years of education: Not on file  . Highest education level: Not on file  Occupational History  . Not on file  Tobacco Use  . Smoking status: Former Smoker    Types: Cigarettes    Quit date: 07/29/2002    Years since quitting: 17.7  . Smokeless tobacco: Never Used  Substance and Sexual Activity  . Alcohol use: Yes    Alcohol/week: 0.0 standard drinks    Comment: 2 per day  . Drug use: No  . Sexual activity: Not on file  Other Topics Concern  . 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 BREAST LUMPECTOMY Right 2000  . CAROTID ARTERY - SUBCLAVIAN ARTERY BYPASS GRAFT Right 2006  . COLONOSCOPY    . UPPER GASTROINTESTINAL ENDOSCOPY      Family History  Problem Relation Age of Onset  . Heart disease Mother   . Kidney disease Mother   . Heart disease Father   . Diabetes Sister   . Kidney disease Sister   . Heart disease Maternal Grandmother   . Heart murmur Brother     Allergies  Allergen Reactions  . Shellfish Allergy Anaphylaxis  . Codeine Nausea Only  . Morphine And Related Other (See Comments)    slurring words  . Phenergan [Promethazine] Other (See Comments)    Causes blood presser to bottom out  . Sulfa Antibiotics Rash    Current Outpatient Medications  on File Prior to Visit  Medication Sig Dispense Refill  . amLODipine (NORVASC) 2.5 MG tablet TAKE 1 TABLET BY MOUTH EVERY DAY 30 tablet 2  . atorvastatin (LIPITOR) 20 MG tablet TAKE 1 TABLET BY MOUTH EVERY DAY 90 tablet 1  . EPINEPHrine 0.3 mg/0.3 mL IJ SOAJ injection Inject 0.3 mLs (0.3 mg total) into the muscle as needed for anaphylaxis. Use as needed for severe allergic reaction 2 each 2  . fluticasone (FLONASE) 50 MCG/ACT nasal spray Place 1 spray into both nostrils 2 (two) times daily. 16 g 6  . levothyroxine (SYNTHROID) 100 MCG tablet TAKE 1 TABLET BY MOUTH EVERY DAY 90 tablet 1  . propranolol (INDERAL) 40 MG tablet 1 tablet by mouth 60-90 minutes prior to public speaking    . SUMAtriptan (IMITREX) 50 MG tablet MAY REPEAT IN 2 HOURS IF HEADACHE PERSISTS OR RECURS. (MAX 2 TABS/24 HR) 10 tablet 2  . warfarin (COUMADIN) 10 MG tablet Take 10 mg by mouth daily.     No current  facility-administered medications on file prior to visit.    BP 118/78 (BP Location: Right Arm, Patient Position: Sitting, Cuff Size: Normal)   Pulse (!) 57   Temp 97.8 F (36.6 C)   Resp 17   Ht 5\' 7"  (1.702 m)   Wt 200 lb (90.7 kg)   SpO2 98%   BMI 31.32 kg/m       Objective:   Physical Exam Constitutional:      Appearance: She is well-developed.  Neck:     Thyroid: No thyromegaly.  Cardiovascular:     Rate and Rhythm: Normal rate and regular rhythm.     Pulses:          Dorsalis pedis pulses are 1+ on the right side and 1+ on the left side.       Posterior tibial pulses are 2+ on the right side and 2+ on the left side.     Heart sounds: Normal heart sounds. No murmur heard.   Pulmonary:     Effort: Pulmonary effort is normal. No respiratory distress.     Breath sounds: Normal breath sounds. No wheezing.  Musculoskeletal:     Cervical back: Neck supple.  Skin:    General: Skin is warm and dry.  Neurological:     Mental Status: She is alert and oriented to person, place, and time.     Sensory: Sensory deficit (decreased sensation to monofilament left anterior thigh in distribution of L4 Dermatome) present.     Deep Tendon Reflexes:     Reflex Scores:      Patellar reflexes are 2+ on the right side and 2+ on the left side.      Achilles reflexes are 2+ on the right side and 2+ on the left side.    Comments: Bilateral LE strength is 5/5 Negative straight leg raise bilaterally.   Psychiatric:        Behavior: Behavior normal.        Thought Content: Thought content normal.        Judgment: Judgment normal.           Assessment & Plan:  Left L4 Radiculopathy- will give trial of medrol dose pak. Will also obtain an x-ray of her lumbar x-ray for further evaluation. She is advised of red flags that should prompt ED visit including bowel/bladder incontinence and LE weakness/falls.    Abnormal LE pulses- diminished dorsalis pedis pulses bilaterally.  She does have  a hx  of carotid artery stenosis and is followed by vascular.   She is also due for follow up carotid doppler. Will refer back to vascular clinic and order LE ABI.  Hx of carotid artery stenosis- obtain follow up carotid doppler.  Hypothyroid- obtain follow up TSH. Continue synthroid 100 mcg daily.   40 minutes spent on today's visit. Time was spent counseling patient, reviewing outside medical records and coordinating medical care/medical plan.  This visit occurred during the SARS-CoV-2 public health emergency.  Safety protocols were in place, including screening questions prior to the visit, additional usage of staff PPE, and extensive cleaning of exam room while observing appropriate contact time as indicated for disinfecting solutions.

## 2020-05-01 NOTE — Patient Instructions (Signed)
Please begin medrol dose pak. Complete x-ray on the first floor.  We will work on getting your ultrasound tests scheduled.

## 2020-06-07 ENCOUNTER — Telehealth: Payer: Self-pay | Admitting: Family

## 2020-06-07 ENCOUNTER — Other Ambulatory Visit: Payer: Self-pay | Admitting: Family

## 2020-06-07 ENCOUNTER — Encounter: Payer: Self-pay | Admitting: Family

## 2020-06-07 ENCOUNTER — Other Ambulatory Visit: Payer: Self-pay

## 2020-06-07 ENCOUNTER — Telehealth (INDEPENDENT_AMBULATORY_CARE_PROVIDER_SITE_OTHER): Payer: BC Managed Care – PPO | Admitting: Family

## 2020-06-07 VITALS — HR 77 | Temp 99.6°F | Ht 67.0 in | Wt 198.0 lb

## 2020-06-07 DIAGNOSIS — U071 COVID-19: Secondary | ICD-10-CM

## 2020-06-07 MED ORDER — AZITHROMYCIN 250 MG PO TABS: ORAL_TABLET | ORAL | 0 refills | Status: DC

## 2020-06-07 NOTE — Progress Notes (Signed)
Melinda Lopez is a 60 y.o. female with the following history as recorded in Epic[facility]:  Patient Active Problem List   Diagnosis Date Noted  . Arthritis of metatarsophalangeal (MTP) joint of great toe 07/20/2018  . Anti-phospholipid antibody syndrome (HCC) 06/16/2017  . 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  . Greater trochanteric bursitis of right hip 02/28/2015  . Patellofemoral syndrome 02/28/2015  . Long term current use of anticoagulant 02/25/2015  . Hypercoagulable state (HCC) 02/25/2015  . Fatty liver 05/29/2014  . Preventative health care 04/12/2014  . History of blood clots 03/15/2014  . Patent foramen ovale 03/15/2014  . History of carotid artery stenosis 03/15/2014  . Migraines 03/15/2014  . Hypothyroidism 03/15/2014  . Hyperlipidemia 03/15/2014  . Hx of adenomatous polyp of colon 07/30/2011    Current Outpatient Medications  Medication Sig Dispense Refill  . ALPRAZolam (XANAX) 0.5 MG tablet Take 1/2-1 tablet by mouth prior to flying. 20 tablet 0  . amLODipine (NORVASC) 2.5 MG tablet TAKE 1 TABLET BY MOUTH EVERY DAY 30 tablet 2  . atorvastatin (LIPITOR) 20 MG tablet TAKE 1 TABLET BY MOUTH EVERY DAY 90 tablet 1  . azithromycin (ZITHROMAX) 250 MG tablet 2 tabs po qd x 1 day; 1 tablet per day x 4 days; 6 tablet 0  . EPINEPHrine 0.3 mg/0.3 mL IJ SOAJ injection Inject 0.3 mLs (0.3 mg total) into the muscle as needed for anaphylaxis. Use as needed for severe allergic reaction 2 each 2  . fluticasone (FLONASE) 50 MCG/ACT nasal spray Place 1 spray into both nostrils 2 (two) times daily. 16 g 6  . levothyroxine (SYNTHROID) 100 MCG tablet TAKE 1 TABLET BY MOUTH EVERY DAY 90 tablet 1  . propranolol (INDERAL) 40 MG tablet 1 tablet by mouth 60-90 minutes prior to public speaking    . SUMAtriptan (IMITREX) 50 MG tablet MAY REPEAT IN 2 HOURS IF HEADACHE PERSISTS OR RECURS. (MAX 2 TABS/24 HR) 10 tablet 2  . warfarin (COUMADIN) 10 MG tablet Take 10 mg by mouth daily.     No current  facility-administered medications for this visit.    Allergies: Shellfish allergy, Codeine, Morphine and related, Phenergan [promethazine], and Sulfa antibiotics  Past Medical History:  Diagnosis Date  . Carotid artery stenosis   . Chronic headaches   . Fatty liver 04/07/2016  . Hx of adenomatous polyp of colon 07/30/2011  . Hx of blood clots    fingers  . Hyperlipemia   . Hypertension   . Hypothyroidism   . Migraine 1982   pt states migraines since then.   Marland Kitchen PFO (patent foramen ovale)   . TIA (transient ischemic attack)     Past Surgical History:  Procedure Laterality Date  . BREAST LUMPECTOMY Right 2000  . CAROTID ARTERY - SUBCLAVIAN ARTERY BYPASS GRAFT Right 2006  . COLONOSCOPY    . UPPER GASTROINTESTINAL ENDOSCOPY      Family History  Problem Relation Age of Onset  . Heart disease Mother   . Kidney disease Mother   . Heart disease Father   . Diabetes Sister   . Kidney disease Sister   . Heart disease Maternal Grandmother   . Heart murmur Brother     Social History   Tobacco Use  . Smoking status: Former Smoker    Types: Cigarettes    Quit date: 07/29/2002    Years since quitting: 17.8  . Smokeless tobacco: Never Used  Substance Use Topics  . Alcohol use: Yes    Alcohol/week: 0.0 standard drinks  Comment: 2 per day    Subjective:   I connected with Wynona Neat on 06/07/20 at  3:40 PM EDT by a video enabled telemedicine application and verified that I am speaking with the correct person using two identifiers.   I discussed the limitations of evaluation and management by telemedicine and the availability of in person appointments. The patient expressed understanding and agreed to proceed. Provider in office/ patient is at home; provider and patient are only 2 people on video call.   Patient noticed on Wednesday that she just felt very tired; by Thursday, symptoms worsening and felt "bad." symptoms progressively worsened as day went on and developed fever/  chills and head congestion; took home COVID test yesterday- +; Concerned about developing secondary infection and also asking for prescription for Z-pak; is already taking Zinc, Vitamin C and Vitamin D;      Objective:  Vitals:   06/07/20 1544  Pulse: 77  Temp: 99.6 F (37.6 C)  TempSrc: Oral  SpO2: 98%  Weight: 198 lb (89.8 kg)  Height: 5\' 7"  (1.702 m)    General: Well developed, well nourished, in no acute distress  Skin : Warm and dry.  Head: Normocephalic and atraumatic  Lungs: Respirations unlabored; clear to auscultation bilaterally without wheeze, rales, rhonchi  Neurologic: Alert and oriented; speech intact; face symmetrical; moves all extremities well; CNII-XII intact without focal deficit   Assessment:  1. COVID-19     Plan:  Due to use of Coumadin, am hesitant to use Paxlovid; have put in urgent referral for monoclonal antibodies; Rx for Z-pak also given to use as needed; Increase fluids,rest and strict ER precautions;  This visit occurred during the SARS-CoV-2 public health emergency.  Safety protocols were in place, including screening questions prior to the visit, additional usage of staff PPE, and extensive cleaning of exam room while observing appropriate contact time as indicated for disinfecting solutions.     No follow-ups on file.  No orders of the defined types were placed in this encounter.   Requested Prescriptions   Signed Prescriptions Disp Refills  . azithromycin (ZITHROMAX) 250 MG tablet 6 tablet 0    Sig: 2 tabs po qd x 1 day; 1 tablet per day x 4 days;

## 2020-06-07 NOTE — Telephone Encounter (Signed)
Patient advised her pcp was made aware of her being covid positive. pcp advises to follow Laura's advise this afternoon when she sees her,.

## 2020-06-07 NOTE — Telephone Encounter (Signed)
Patient has tested positive for covid 19, patient would like to know if Efraim Kaufmann has an recommendations, patient has a mychart visit with Ria Clock

## 2020-06-08 ENCOUNTER — Telehealth: Payer: Self-pay | Admitting: Unknown Physician Specialty

## 2020-06-08 NOTE — Telephone Encounter (Signed)
Outpatient Oral COVID Treatment Note  I connected with Melinda Lopez on 06/08/2020/12:49 PM by telephone and verified that I am speaking with the correct person using two identifiers.  I discussed the limitations, risks, security, and privacy concerns of performing an evaluation and management service by telephone and the availability of in person appointments. I also discussed with the patient that there may be a patient responsible charge related to this service. The patient expressed understanding and agreed to proceed.  Patient location: home Provider location: home  Diagnosis: COVID-19 infection  Purpose of visit: Discussion of potential use of Molnupiravir or Paxlovid, a new treatment for mild to moderate COVID-19 viral infection in non-hospitalized patients.   Subjective: Patient is a 60 y.o. female who has been diagnosed with COVID 19 viral infection.  Their symptoms began on 5/4 with fatigue.    Past Medical History:  Diagnosis Date  . Carotid artery stenosis   . Chronic headaches   . Fatty liver 04/07/2016  . Hx of adenomatous polyp of colon 07/30/2011  . Hx of blood clots    fingers  . Hyperlipemia   . Hypertension   . Hypothyroidism   . Migraine 1982   pt states migraines since then.   Marland Kitchen PFO (patent foramen ovale)   . TIA (transient ischemic attack)     Allergies  Allergen Reactions  . Shellfish Allergy Anaphylaxis  . Codeine Nausea Only  . Morphine And Related Other (See Comments)    slurring words  . Phenergan [Promethazine] Other (See Comments)    Causes blood presser to bottom out  . Sulfa Antibiotics Rash     Current Outpatient Medications:  .  ALPRAZolam (XANAX) 0.5 MG tablet, Take 1/2-1 tablet by mouth prior to flying., Disp: 20 tablet, Rfl: 0 .  amLODipine (NORVASC) 2.5 MG tablet, TAKE 1 TABLET BY MOUTH EVERY DAY, Disp: 30 tablet, Rfl: 2 .  atorvastatin (LIPITOR) 20 MG tablet, TAKE 1 TABLET BY MOUTH EVERY DAY, Disp: 90 tablet, Rfl: 1 .  azithromycin  (ZITHROMAX) 250 MG tablet, 2 tabs po qd x 1 day; 1 tablet per day x 4 days;, Disp: 6 tablet, Rfl: 0 .  EPINEPHrine 0.3 mg/0.3 mL IJ SOAJ injection, Inject 0.3 mLs (0.3 mg total) into the muscle as needed for anaphylaxis. Use as needed for severe allergic reaction, Disp: 2 each, Rfl: 2 .  fluticasone (FLONASE) 50 MCG/ACT nasal spray, Place 1 spray into both nostrils 2 (two) times daily., Disp: 16 g, Rfl: 6 .  levothyroxine (SYNTHROID) 100 MCG tablet, TAKE 1 TABLET BY MOUTH EVERY DAY, Disp: 90 tablet, Rfl: 1 .  propranolol (INDERAL) 40 MG tablet, 1 tablet by mouth 60-90 minutes prior to public speaking, Disp: , Rfl:  .  SUMAtriptan (IMITREX) 50 MG tablet, MAY REPEAT IN 2 HOURS IF HEADACHE PERSISTS OR RECURS. (MAX 2 TABS/24 HR), Disp: 10 tablet, Rfl: 2 .  warfarin (COUMADIN) 10 MG tablet, Take 10 mg by mouth daily., Disp: , Rfl:   Objective: Patient appears/sounds well.  They are in no apparent distress.  Breathing is non labored.  Mood and behavior are normal.  Laboratory Data:  Recent Results (from the past 2160 hour(s))  [facility] MAMMOGRAPHY     Status: None   Collection Time: 04/29/20 12:00 AM  Result Value Ref Range   HM Mammogram 0-4 Bi-Rad 0-4 Bi-Rad, Self Reported Normal  TSH     Status: None   Collection Time: 05/01/20 10:35 AM  Result Value Ref Range   TSH 0.79  0.35 - 4.50 uIU/mL  SARS-COV-2 IgG     Status: None   Collection Time: 05/01/20 10:35 AM  Result Value Ref Range   SARS-COV-2 IgG 5.67 Reactive Non-Reactive    Comment: This test is intended for use as an aid in identifying individuals with an adaptive immune response to SARS-CoV-2, indicating recent or prior infection. Results are for the detection of SARS-CoV-2 antibodies. IgG antibodies to SARS-CoV-2 are generally  detectable in blood several days after initial infection, although the duration of time antibodies are present post-infection is not well characterized. At this time, it is unknown for how long antibodies persist  following infection and if the presence  of antibodies confers protective immunity. Individuals may have detectable virus by molecular testing present for several weeks following seroconversion. Negative results do not preclude acute SARS-CoV-2 infection. This test should not be used to  diagnose acute SARS-CoV-2 infection. If acute infection is suspected, direct testing by molecular methods for SARS-CoV-2 is necessary. False positive results for the test may occur due to cross-reactivity from pre-existing antibodies or other  possible  causes.  Results for this test are reported as "Reactive" if antibodies are detected, "Non-Reactive" if not detected or "Equivocal" if neither "Reactive nor Non-Reactive".  For samples in the Equivocal zone it is recommended that a new sample be  collected and retested 1-2 weeks later.  A conversion from Equivocal to Reactive for IgG antibody should be considered as evidence of seroconversion due to recent infection.This test has been authorized by the FDA under an Emergency Use Authorization  (EUA) for use by authorized laboratories.      Assessment: 60 y.o. female with mild/moderate COVID 19 viral infection diagnosed on 5/4 at high risk for progression to severe COVID 19.  Plan:  This patient is a 60 y.o. female that meets the following criteria for Emergency Use Authorization of: Molnupiravir  1. Age >18 yr 2. SARS-COV-2 positive test 3. Symptom onset < 5 days 4. Mild-to-moderate COVID disease with high risk for severe progression to hospitalization or death   I have spoken and communicated the following to the patient or parent/caregiver regarding: 1. Molnupiravir is an unapproved drug that is authorized for use under an TEFL teacher.  2. There are no adequate, approved, available products for the treatment of COVID-19 in adults who have mild-to-moderate COVID-19 and are at high risk for progressing to severe COVID-19, including hospitalization  or death. 3. Other therapeutics are currently authorized. For additional information on all products authorized for treatment or prevention of COVID-19, please see [email or web address]  4. There are benefits and risks of taking this treatment as outlined in the "Fact Sheet for Patients and Caregivers."  5. "Fact Sheet for Patients and Caregivers" was reviewed with patient. A hard copy will be provided to patient from pharmacy prior to the patient receiving treatment. 6. Patients should continue to self-isolate and use infection control measures (e.g., wear mask, isolate, social distance, avoid sharing personal items, clean and disinfect "high touch" surfaces, and frequent handwashing) according to CDC guidelines.  7. The patient or parent/caregiver has the option to accept or refuse treatment. 8. [employer] has established a pregnancy surveillance program. 9. Females of childbearing potential should use a reliable method of contraception correctly and consistently, as applicable, for the duration of treatment and for 4 days after the last dose of Molnupiravir. 10. Males of reproductive potential who are sexually active with females of childbearing potential should use a reliable method of contraception  correctly and consistently during treatment and for at least 3 months after the last dose. 11. Pregnancy status and risk was assessed. Patient verbalized understanding of precautions.   After reviewing above information with the patient, the patient declines treatment.  Follow up instructions:    Put on list for mab tx per physician request.    Gabriel Cirri, NP 06/08/2020 /12:49 PM

## 2020-06-11 ENCOUNTER — Ambulatory Visit (INDEPENDENT_AMBULATORY_CARE_PROVIDER_SITE_OTHER): Payer: BC Managed Care – PPO

## 2020-06-11 ENCOUNTER — Telehealth: Payer: Self-pay

## 2020-06-11 ENCOUNTER — Other Ambulatory Visit: Payer: Self-pay | Admitting: Physician Assistant

## 2020-06-11 DIAGNOSIS — Z8673 Personal history of transient ischemic attack (TIA), and cerebral infarction without residual deficits: Secondary | ICD-10-CM

## 2020-06-11 DIAGNOSIS — Q2112 Patent foramen ovale: Secondary | ICD-10-CM

## 2020-06-11 DIAGNOSIS — U071 COVID-19: Secondary | ICD-10-CM | POA: Diagnosis not present

## 2020-06-11 DIAGNOSIS — Q211 Atrial septal defect: Secondary | ICD-10-CM

## 2020-06-11 DIAGNOSIS — I10 Essential (primary) hypertension: Secondary | ICD-10-CM

## 2020-06-11 MED ORDER — SODIUM CHLORIDE 0.9 % IV SOLN: INTRAVENOUS | Status: DC | PRN

## 2020-06-11 MED ORDER — DIPHENHYDRAMINE HCL 50 MG/ML IJ SOLN: 50.0000 mg | Freq: Once | INTRAMUSCULAR | Status: AC | PRN

## 2020-06-11 MED ORDER — EPINEPHRINE 0.3 MG/0.3ML IJ SOAJ: 0.3000 mg | Freq: Once | INTRAMUSCULAR | Status: AC | PRN

## 2020-06-11 MED ORDER — ALBUTEROL SULFATE HFA 108 (90 BASE) MCG/ACT IN AERS: 2.0000 | INHALATION_SPRAY | Freq: Once | RESPIRATORY_TRACT | Status: AC | PRN

## 2020-06-11 MED ORDER — FAMOTIDINE IN NACL 20-0.9 MG/50ML-% IV SOLN: 20.0000 mg | Freq: Once | INTRAVENOUS | Status: AC | PRN

## 2020-06-11 MED ORDER — BEBTELOVIMAB 175 MG/2 ML IV (EUA)
175.0000 mg | Freq: Once | INTRAMUSCULAR | Status: AC
  Administered 2020-06-11: 175 mg via INTRAVENOUS

## 2020-06-11 MED ORDER — METHYLPREDNISOLONE SODIUM SUCC 125 MG IJ SOLR: 125.0000 mg | Freq: Once | INTRAMUSCULAR | Status: AC | PRN

## 2020-06-11 NOTE — Patient Instructions (Signed)
10 Things You Can Do to Manage Your COVID-19 Symptoms at Home If you have possible or confirmed COVID-19: 1. Stay home except to get medical care. 2. Monitor your symptoms carefully. If your symptoms get worse, call your healthcare provider immediately. 3. Get rest and stay hydrated. 4. If you have a medical appointment, call the healthcare provider ahead of time and tell them that you have or may have COVID-19. 5. For medical emergencies, call 911 and notify the dispatch personnel that you have or may have COVID-19. 6. Cover your cough and sneezes with a tissue or use the inside of your elbow. 7. Wash your hands often with soap and water for at least 20 seconds or clean your hands with an alcohol-based hand sanitizer that contains at least 60% alcohol. 8. As much as possible, stay in a specific room and away from other people in your home. Also, you should use a separate bathroom, if available. If you need to be around other people in or outside of the home, wear a mask. 9. Avoid sharing personal items with other people in your household, like dishes, towels, and bedding. 10. Clean all surfaces that are touched often, like counters, tabletops, and doorknobs. Use household cleaning sprays or wipes according to the label instructions. cdc.gov/coronavirus 08/18/2019 This information is not intended to replace advice given to you by your health care provider. Make sure you discuss any questions you have with your health care provider. Document Revised: 12/04/2019 Document Reviewed: 12/04/2019 Elsevier Patient Education  2021 Elsevier Inc.  What types of side effects do monoclonal antibody drugs cause?  Common side effects  In general, the more common side effects caused by monoclonal antibody drugs include: . Allergic reactions, such as hives or itching . Flu-like signs and symptoms, including chills, fatigue, fever, and muscle aches and pains . Nausea, vomiting . Diarrhea . Skin  rashes . Low blood pressure   The CDC is recommending patients who receive monoclonal antibody treatments wait at least 90 days before being vaccinated.  Currently, there are no data on the safety and efficacy of mRNA COVID-19 vaccines in persons who received monoclonal antibodies or convalescent plasma as part of COVID-19 treatment. Based on the estimated half-life of such therapies as well as evidence suggesting that reinfection is uncommon in the 90 days after initial infection, vaccination should be deferred for at least 90 days, as a precautionary measure until additional information becomes available, to avoid interference of the antibody treatment with vaccine-induced immune responses.   If someone you know is interested in receiving treatment please have them contact their MD for a referral or visit [email or web address]    

## 2020-06-11 NOTE — Progress Notes (Addendum)
Diagnosis: COVID  Provider:  Chilton Greathouse, MD  Procedure: Infusion  IV Type: Peripheral, IV Location: L Hand  Bebtelovimab, Dose: 175 mg  Infusion Start Time: 06/11/20 1545  Infusion Stop Time: 06/11/20 1545  Post Infusion IV Care: Observation period completed and Peripheral IV Discontinued  Discharge: Condition: Good, Destination: Home . AVS provided to patient.   Performed by:  Marin Shutter, RN

## 2020-06-11 NOTE — Progress Notes (Signed)
I connected by phone with Melinda Lopez on 06/11/2020 at 10:32 AM to discuss the potential use of a new treatment for mild to moderate COVID-19 viral infection in non-hospitalized patients.  This patient is a 60 y.o. female that meets the FDA criteria for Emergency Use Authorization of COVID monoclonal antibody bebtelovimab.  Has a (+) direct SARS-CoV-2 viral test result  Has mild or moderate COVID-19   Is NOT hospitalized due to COVID-19  Is within 10 days of symptom onset  Has at least one of the high risk factor(s) for progression to severe COVID-19 and/or hospitalization as defined in EUA.  Specific high risk criteria : Cardiovascular disease or hypertension and Neurodevelopmental disorder   I have spoken and communicated the following to the patient or parent/caregiver regarding COVID monoclonal antibody treatment:  1. FDA has authorized the emergency use for the treatment of mild to moderate COVID-19 in adults and pediatric patients with positive results of direct SARS-CoV-2 viral testing who are 13 years of age and older weighing at least 40 kg, and who are at high risk for progressing to severe COVID-19 and/or hospitalization.  2. The significant known and potential risks and benefits of COVID monoclonal antibody, and the extent to which such potential risks and benefits are unknown.  3. Information on available alternative treatments and the risks and benefits of those alternatives, including clinical trials.  4. Patients treated with COVID monoclonal antibody should continue to self-isolate and use infection control measures (e.g., wear mask, isolate, social distance, avoid sharing personal items, clean and disinfect "high touch" surfaces, and frequent handwashing) according to CDC guidelines.   5. The patient or parent/caregiver has the option to accept or refuse COVID monoclonal antibody treatment.  6. Discussion about the monoclonal antibody infusion does not ensure  treatment. The patient will be placed on a list and scheduled according to risk, symptom onset and availability. A scheduler will reach to the patient to let them know if we can accommodate their infusion or not.  After reviewing this information with the patient, the patient has agreed to receive one of the available covid 19 monoclonal antibodies and will be provided an appropriate fact sheet prior to infusion. [place], [place] 06/11/2020 10:32 AM

## 2020-06-11 NOTE — Telephone Encounter (Signed)
Reviewed MAB Estimate of $1050 with patient.  Stated they would like to proceed.   

## 2020-07-17 ENCOUNTER — Encounter: Payer: Self-pay | Admitting: Family

## 2020-07-17 DIAGNOSIS — R1013 Epigastric pain: Secondary | ICD-10-CM

## 2020-07-23 ENCOUNTER — Other Ambulatory Visit: Payer: Self-pay | Admitting: Family

## 2020-07-24 ENCOUNTER — Other Ambulatory Visit (HOSPITAL_BASED_OUTPATIENT_CLINIC_OR_DEPARTMENT_OTHER): Payer: BC Managed Care – PPO

## 2020-07-26 ENCOUNTER — Ambulatory Visit (HOSPITAL_BASED_OUTPATIENT_CLINIC_OR_DEPARTMENT_OTHER)
Admission: RE | Admit: 2020-07-26 | Discharge: 2020-07-26 | Disposition: A | Discharge status: Home / Self Care | Payer: BC Managed Care – PPO | Source: Ambulatory Visit | Attending: Family | Admitting: Family

## 2020-07-26 ENCOUNTER — Other Ambulatory Visit: Payer: Self-pay

## 2020-07-26 ENCOUNTER — Ambulatory Visit (INDEPENDENT_AMBULATORY_CARE_PROVIDER_SITE_OTHER): Payer: BC Managed Care – PPO | Admitting: Family

## 2020-07-26 VITALS — BP 140/68 | HR 61 | Temp 98.6°F | Resp 16 | Wt 194.0 lb

## 2020-07-26 DIAGNOSIS — E039 Hypothyroidism, unspecified: Secondary | ICD-10-CM

## 2020-07-26 DIAGNOSIS — F40243 Fear of flying: Secondary | ICD-10-CM | POA: Diagnosis not present

## 2020-07-26 DIAGNOSIS — M545 Low back pain, unspecified: Secondary | ICD-10-CM

## 2020-07-26 DIAGNOSIS — I10 Essential (primary) hypertension: Secondary | ICD-10-CM | POA: Diagnosis not present

## 2020-07-26 DIAGNOSIS — R1013 Epigastric pain: Secondary | ICD-10-CM | POA: Insufficient documentation

## 2020-07-26 LAB — BASIC METABOLIC PANEL
BUN: 12 mg/dL (ref 6–23)
CO2: 29 mEq/L (ref 19–32)
Calcium: 10 mg/dL (ref 8.4–10.5)
Chloride: 102 mEq/L (ref 96–112)
Creatinine, Ser: 0.84 mg/dL (ref 0.40–1.20)
GFR: 75.95 mL/min (ref >60.00)
Glucose, Bld: 100 mg/dL — ABNORMAL HIGH (ref 70–99)
Potassium: 3.9 mEq/L (ref 3.5–5.1)
Sodium: 141 mEq/L (ref 135–145)

## 2020-07-26 LAB — TSH: TSH: 0.79 u[IU]/mL (ref 0.35–4.50)

## 2020-07-26 MED ORDER — ALPRAZOLAM 0.5 MG PO TABS: ORAL_TABLET | ORAL | 0 refills | Status: DC

## 2020-07-26 NOTE — Assessment & Plan Note (Signed)
BP Readings from Last 3 Encounters:  07/26/20 140/68  06/11/20 131/83  05/01/20 118/78   BP stable. Continue amlodipine 2.5mg .

## 2020-07-26 NOTE — Assessment & Plan Note (Signed)
Has upcoming trip, requesting refill of xanax for prn use.  Controlled substance contract is updated and UDS will be obtained.

## 2020-07-26 NOTE — Progress Notes (Signed)
Subjective:   By signing my name below, I, [name], attest that this documentation has been prepared under the direction and in the presence of [name] NP. 07/26/2020      Patient ID: Melinda Lopez, female    DOB: April 21, 1960, 60 y.o.   MRN: [id number]  Chief Complaint  Patient presents with   Abdominal Pain    Scheduled for abd [place] today    Back Pain    Here for follow up    HPI Patient is in today for a office visit. She complain fatigue/discomfort around her waist and back. Her stomach is tender to touch. She does not have back pain while walking. She recently recovered from Covid-19 and notes having heavy fatigue for the 2 weeks. 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 She reports that OTC Advil helped manage her symptoms but she is not supposed to take it because of her INR level. She is interested in trying physical therapy to manage her symptoms. During his last visit she complains of hip pain and was given medrol dose pack to manage her pain. She reports feeling no relief while taking it.   Xanax- She continues using 0.5 mg xanax PRN before flying on an airplane.   Health Maintenance Due  Topic Date Due   Zoster Vaccines- Shingrix (1 of 2) Never done   COVID-19 Vaccine (2 - Pfizer series) 02/07/2020   TETANUS/TDAP  05/13/2020   PAP SMEAR-Modifier  06/15/2020    Past Medical History:  Diagnosis Date   Carotid artery stenosis    Chronic headaches    Fatty liver 04/07/2016   Hx of adenomatous polyp of colon 07/30/2011   Hx of blood clots    fingers   Hyperlipemia    Hypertension    Hypothyroidism    Migraine 1982   pt states migraines since then.    PFO (patent foramen ovale)    TIA (transient ischemic attack)     Past Surgical History:  Procedure Laterality Date   BREAST LUMPECTOMY Right 2000   CAROTID ARTERY - SUBCLAVIAN ARTERY BYPASS GRAFT Right 2006   COLONOSCOPY     UPPER  GASTROINTESTINAL ENDOSCOPY      Family History  Problem Relation Age of Onset   Heart disease Mother    Kidney disease Mother    Heart disease Father    Diabetes Sister    Kidney disease Sister    Heart disease Maternal Grandmother    Heart murmur Brother     Social History   Socioeconomic History   Marital status: Married    Spouse name: Not on file   Number of children: 2   Years of education: Not on file   Highest education level: Not on file  Occupational History   Not on file  Tobacco Use   Smoking status: Former    Pack years: 0.00    Types: Cigarettes    Quit date: 07/29/2002    Years since quitting: 18.0   Smokeless tobacco: Never  Substance and Sexual Activity   Alcohol use: Yes    Alcohol/week: 0.0 standard drinks    Comment: 2 per day   Drug use: No   Sexual activity: Not on file  Other Topics Concern   Not on file  Social History Narrative   Former smoker- quit in the 14's   Married    2 children: 1982- daughter Leslee Home- lives in [place]  12 Chrissie Noa- lives locally   2 grandchildren in [place]   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   Social Determinants of Corporate investment banker Strain: Not on [employer] Insecurity: Not on file  Transportation Needs: Not on file  Physical Activity: Not on file  Stress: Not on file  Social Connections: Not on file  Intimate Partner Violence: Not on file    Outpatient Medications Prior to Visit  Medication Sig Dispense Refill   amLODipine (NORVASC) 2.5 MG tablet TAKE 1 TABLET BY MOUTH EVERY DAY 30 tablet 2   atorvastatin (LIPITOR) 20 MG tablet TAKE 1 TABLET BY MOUTH EVERY DAY 90 tablet 0   EPINEPHrine 0.3 mg/0.3 mL IJ SOAJ injection Inject 0.3 mLs (0.3 mg total) into the muscle as needed for anaphylaxis. Use as needed for severe allergic reaction 2 each 2   fluticasone (FLONASE) 50 MCG/ACT nasal spray Place 1 spray into both nostrils 2 (two) times daily. 16 g 6   levothyroxine  (SYNTHROID) 100 MCG tablet TAKE 1 TABLET BY MOUTH EVERY DAY 90 tablet 1   propranolol (INDERAL) 40 MG tablet 1 tablet by mouth 60-90 minutes prior to public speaking     SUMAtriptan (IMITREX) 50 MG tablet MAY REPEAT IN 2 HOURS IF HEADACHE PERSISTS OR RECURS. (MAX 2 TABS/24 HR) 10 tablet 2   warfarin (COUMADIN) 10 MG tablet Take 10 mg by mouth daily.     ALPRAZolam (XANAX) 0.5 MG tablet Take 1/2-1 tablet by mouth prior to flying. 20 tablet 0   azithromycin (ZITHROMAX) 250 MG tablet 2 tabs po qd x 1 day; 1 tablet per day x 4 days; 6 tablet 0   Facility-Administered Medications Prior to Visit  Medication Dose Route Frequency Provider Last Rate Last Admin   0.9 %  sodium chloride infusion   Intravenous PRN Bhagat, Bhavinkumar, PA        Allergies  Allergen Reactions   Shellfish Allergy Anaphylaxis   Codeine Nausea Only   Morphine And Related Other (See Comments)    slurring words   Phenergan [Promethazine] Other (See Comments)    Causes blood presser to bottom out   Sulfa Antibiotics Rash    Review of Systems  Constitutional:  Positive for malaise/fatigue (Muscle fatigue in hips and abdomen).  Musculoskeletal:  Positive for joint pain (bilateral hip).      Objective:    Physical Exam Constitutional:      General: She is not in acute distress.    Appearance: Normal appearance. She is not ill-appearing.  HENT:     Head: Normocephalic and atraumatic.     Right Ear: External ear normal.     Left Ear: External ear normal.  Eyes:     Extraocular Movements: Extraocular movements intact.     Pupils: Pupils are equal, round, and reactive to light.  Cardiovascular:     Rate and Rhythm: Normal rate and regular rhythm.     Pulses: Normal pulses.     Heart sounds: Normal heart sounds. No murmur heard.   No gallop.  Pulmonary:     Effort: Pulmonary effort is normal. No respiratory distress.     Breath sounds: Normal breath sounds. No wheezing, rhonchi or rales.  Skin:    General: Skin  is warm and dry.  Neurological:     Mental Status: She is alert and oriented to person, place, and time.  Psychiatric:        Behavior: Behavior normal.    BP 140/68 (BP Location:  Right Arm, Patient Position: Sitting, Cuff Size: Small)   Pulse 61   Temp 98.6 F (37 C) (Oral)   Resp 16   Wt 194 lb (88 kg)   SpO2 97%   BMI 30.38 kg/m  Wt Readings from Last 3 Encounters:  07/26/20 194 lb (88 kg)  06/07/20 198 lb (89.8 kg)  05/01/20 200 lb (90.7 kg)       Assessment & Plan:   Problem List Items Addressed This Visit       Unprioritized   Low back pain - Primary    I think her low back weakness/and pain is related to lumbar disc disease. Will refer for PT.  If no improvement, consider MRI.       Relevant Orders   Ambulatory referral to Physical Therapy   Hypothyroidism    Stable on synthoid . Check TSH.        Relevant Orders   TSH   Fear of flying    Has upcoming trip, requesting refill of xanax for prn use.  Controlled substance contract is updated and UDS will be obtained.        Relevant Medications   ALPRAZolam (XANAX) 0.5 MG tablet   Other Relevant Orders   DRUG MONITORING, PANEL 8 WITH CONFIRMATION, URINE   Essential hypertension    BP Readings from Last 3 Encounters:  07/26/20 140/68  06/11/20 131/83  05/01/20 118/78  BP stable. Continue amlodipine 2.5mg .       Relevant Orders   Basic metabolic panel    Meds ordered this encounter  Medications   ALPRAZolam (XANAX) 0.5 MG tablet    Sig: Take 1/2-1 tablet by mouth prior to flying.    Dispense:  20 tablet    Refill:  0    Order Specific Question:   Supervising Provider    Answer:   Danise Edge A [[id number]]    I, Lemont Fillers, NP, personally preformed the services described in this documentation.  All medical record entries made by the scribe were at my direction and in my presence.  I have reviewed the chart and discharge instructions (if applicable) and agree that the record  reflects my personal performance and is accurate and complete. @ENCDATE @     , NP

## 2020-07-26 NOTE — Assessment & Plan Note (Signed)
Stable on synthoid . Check TSH.

## 2020-07-26 NOTE — Assessment & Plan Note (Signed)
I think her low back weakness/and pain is related to lumbar disc disease. Will refer for PT.  If no improvement, consider MRI.

## 2020-07-29 ENCOUNTER — Telehealth: Payer: Self-pay | Admitting: Family

## 2020-07-29 NOTE — Telephone Encounter (Signed)
See mychart.  

## 2020-08-04 LAB — DRUG MONITORING, PANEL 8 WITH CONFIRMATION, URINE
6 Acetylmorphine: NEGATIVE ng/mL (ref <10)
Alcohol Metabolites: POSITIVE ng/mL — AB (ref <500)
Amphetamines: NEGATIVE ng/mL (ref <500)
Benzodiazepines: NEGATIVE ng/mL (ref <100)
Buprenorphine, Urine: NEGATIVE ng/mL (ref <5)
Cocaine Metabolite: NEGATIVE ng/mL (ref <150)
Creatinine: 221.3 mg/dL (ref >=20.0)
Ethyl Glucuronide (ETG): >10000 ng/mL — ABNORMAL HIGH (ref <500)
Ethyl Sulfate (ETS): >10000 ng/mL — ABNORMAL HIGH (ref <100)
MDMA: NEGATIVE ng/mL (ref <500)
Marijuana Metabolite: NEGATIVE ng/mL (ref <20)
Opiates: NEGATIVE ng/mL (ref <100)
Oxidant: NEGATIVE ug/mL (ref <200)
Oxycodone: NEGATIVE ng/mL (ref <100)
pH: 6.9 (ref 4.5–9.0)

## 2020-08-04 LAB — DM TEMPLATE

## 2020-09-07 ENCOUNTER — Other Ambulatory Visit: Payer: Self-pay | Admitting: Family

## 2020-09-09 ENCOUNTER — Encounter: Payer: Self-pay | Admitting: Family

## 2020-09-09 MED ORDER — ATORVASTATIN CALCIUM 20 MG PO TABS: 20.0000 mg | ORAL_TABLET | Freq: Every day | ORAL | 1 refills | Status: DC

## 2020-09-30 ENCOUNTER — Other Ambulatory Visit: Payer: Self-pay | Admitting: Family

## 2020-10-03 ENCOUNTER — Other Ambulatory Visit: Payer: Self-pay | Admitting: Medical

## 2020-10-26 ENCOUNTER — Other Ambulatory Visit: Payer: Self-pay | Admitting: Family

## 2020-11-11 ENCOUNTER — Other Ambulatory Visit: Payer: Self-pay | Admitting: Family

## 2020-11-12 ENCOUNTER — Encounter: Payer: Self-pay | Admitting: Family

## 2020-11-12 DIAGNOSIS — N819 Female genital prolapse, unspecified: Secondary | ICD-10-CM

## 2020-12-03 ENCOUNTER — Other Ambulatory Visit: Payer: Self-pay | Admitting: Family

## 2020-12-05 NOTE — Progress Notes (Signed)
GYNECOLOGY OFFICE VISIT NOTE  History:   Melinda Lopez is a 60 y.o. 2/2 here today for prolapse. She noticed the symptoms for the first time over labor day. Feels like a bulge at the opening of her vagina. She hasn't had intercourse since she felt it. She has also had ongoing pelvic/lower abdominal pain that comes and goes. Her PCP thought perhaps related to her back pain and had her do PT related to that but her lower abdominal pain continued. She denies any PMB.   She is still sexually active with her husband.   She has a h/o 2 FT SVD. Tested to 8lb 15oz.   She also notes right breast lump. It is near her nipple and it is tender on exam. She had a breast lump in the same area years ago and it was removed and benign.   She denies any abnormal vaginal discharge, bleeding, pelvic pain or other concerns.     Past Medical History:  Diagnosis Date   Carotid artery stenosis    Chronic headaches    Fatty liver 04/07/2016   Hx of adenomatous polyp of colon 07/30/2011   Hx of blood clots    fingers   Hyperlipemia    Hypertension    Hypothyroidism    Migraine 1982   pt states migraines since then.    PFO (patent foramen ovale)    TIA (transient ischemic attack)     Past Surgical History:  Procedure Laterality Date   BREAST LUMPECTOMY Right 2000   CAROTID ARTERY - SUBCLAVIAN ARTERY BYPASS GRAFT Right 2006   COLONOSCOPY     UPPER GASTROINTESTINAL ENDOSCOPY      The following portions of the patient's history were reviewed and updated as appropriate: allergies, current medications, past family history, past medical history, past social history, past surgical history and problem list.   Health Maintenance:   Diagnosis  Date Value Ref Range Status  06/15/2017   Final   NEGATIVE FOR INTRAEPITHELIAL LESIONS OR MALIGNANCY.     Normal mammogram on 04/2020.   Review of Systems:  Pertinent items noted in HPI and remainder of comprehensive ROS otherwise negative.  Physical  Exam:  BP (!) 153/80   Pulse (!) 56   Ht 5\' 7"  (1.702 m)   Wt 195 lb (88.5 kg)   BMI 30.54 kg/m  CONSTITUTIONAL: Well-developed, well-nourished female in no acute distress.  HEENT:  Normocephalic, atraumatic. External right and left ear normal. No scleral icterus.  NECK: Normal range of motion, supple, no masses noted on observation SKIN: No rash noted. Not diaphoretic. No erythema. No pallor. MUSCULOSKELETAL: Normal range of motion. No edema noted. NEUROLOGIC: Alert and oriented to person, place, and time. Normal muscle tone coordination. No cranial nerve deficit noted. PSYCHIATRIC: Normal mood and affect. Normal behavior. Normal judgment and thought content. BREAST: Left breast normal with no tenderness, skin changes or lumps. Right breast with prior incision and around the nipple, possible 1 cm round lump that is tender on exam - unable to tell if discernible lump vs breast tissue  CARDIOVASCULAR: Normal heart rate noted RESPIRATORY: Effort and breath sounds normal, no problems with respiration noted ABDOMEN: No masses noted. No other overt distention noted.    PELVIC: Normal appearing external genitalia; normal urethral meatus; normal appearing vaginal mucosa and cervix.  No abnormal discharge noted.  Normal uterine size, no other palpable masses, no uterine or adnexal tenderness. Anterior vaginal wall prolapse with some descensus of the cervix but cervix is >2  cm above the hymen. The anterior vaginal wall descends to just the hymen with valsalva, good support of the posterior vaginal wall. Bilateral levator tenderness, right greater than left - pt reports this causes pain similar to what she has been feeling. Performed in the presence of a chaperone  Labs and Imaging No results found for this or any previous visit (from the past 168 hour(s)). No results found.  Assessment and Plan:    1. Prolapse, uterovaginal - Discussed finding of prolapse - Discussed options for intervention:  Expectant management, PFPT, pessary and surgery.  - Surgical options include native tissue repair vs mesh based surgery. Examples are  anterior repair and Sacrocolpopexy. We discussed hesitation for surgery would be her other medical problems and warfarin use. Discussed complexity and risk of each surgery and general recovery. Discussed relative success rates.  - I also think based on her early presentation of symptoms that she would benefit well from PFPT plus for her levator tenderness/pelvic pain it can also help with this. The two conditions can be related.  - We also discussed that weight loss can improve symptoms but also acknowledged that is hard to lose weight once post-menopausal. So while helpful, should not be the sole method of treating the prolapse.  - Offered referral to Urogynecology - She would like to consider her options and PFPT in the meantime.   2. Right breast lump - Will err on side of caution - will send for diagnostic mammogram of the right breast. Additional imaging as indicated.  - Discussed standards of care within radiology I.e. CAD, tomosynthesis, etc.    Routine preventative health maintenance measures emphasized. Please refer to After Visit Summary for other counseling recommendations.   Return if symptoms worsen or fail to improve.  I spent  32  minutes dedicated to the care of this patient including pre-visit review of records, face to face time with the patient discussing her conditions and treatments and post visit orders.    Milas Hock, MD, FACOG Obstetrician & Gynecologist, [facility] for [place], [facility]

## 2020-12-06 ENCOUNTER — Ambulatory Visit (INDEPENDENT_AMBULATORY_CARE_PROVIDER_SITE_OTHER): Payer: BC Managed Care – PPO | Admitting: Obstetrics and Gynecology

## 2020-12-06 ENCOUNTER — Other Ambulatory Visit: Payer: Self-pay

## 2020-12-06 ENCOUNTER — Encounter: Payer: Self-pay | Admitting: Obstetrics and Gynecology

## 2020-12-06 ENCOUNTER — Other Ambulatory Visit (HOSPITAL_BASED_OUTPATIENT_CLINIC_OR_DEPARTMENT_OTHER): Payer: Self-pay

## 2020-12-06 VITALS — BP 153/80 | HR 56 | Ht 67.0 in | Wt 195.0 lb

## 2020-12-06 DIAGNOSIS — N814 Uterovaginal prolapse, unspecified: Secondary | ICD-10-CM

## 2020-12-06 DIAGNOSIS — N6310 Unspecified lump in the right breast, unspecified quadrant: Secondary | ICD-10-CM

## 2020-12-06 MED ORDER — INFLUENZA VAC SPLIT QUAD 0.5 ML IM SUSY
PREFILLED_SYRINGE | INTRAMUSCULAR | 0 refills | Status: DC
  Filled 2020-12-06: qty 0.5, 1d supply, fill #0

## 2020-12-06 NOTE — Progress Notes (Signed)
Patient states she has had abdominal pain for about a year. OVer the summer she noticed a bulge in her vaginal area.Armandina Stammer RN

## 2021-01-08 ENCOUNTER — Other Ambulatory Visit: Payer: Self-pay | Admitting: Obstetrics and Gynecology

## 2021-01-08 DIAGNOSIS — N6310 Unspecified lump in the right breast, unspecified quadrant: Secondary | ICD-10-CM

## 2021-01-09 ENCOUNTER — Ambulatory Visit: Payer: BC Managed Care – PPO | Admitting: Physical Therapy

## 2021-01-21 ENCOUNTER — Ambulatory Visit: Payer: BC Managed Care – PPO | Admitting: Physical Therapy

## 2021-01-23 ENCOUNTER — Telehealth: Payer: BC Managed Care – PPO | Admitting: Physician Assistant

## 2021-01-23 DIAGNOSIS — J019 Acute sinusitis, unspecified: Secondary | ICD-10-CM | POA: Diagnosis not present

## 2021-01-23 DIAGNOSIS — B9689 Other specified bacterial agents as the cause of diseases classified elsewhere: Secondary | ICD-10-CM

## 2021-01-23 MED ORDER — AMOXICILLIN-POT CLAVULANATE 875-125 MG PO TABS: 1.0000 | ORAL_TABLET | Freq: Two times a day (BID) | ORAL | 0 refills | Status: DC

## 2021-01-23 NOTE — Progress Notes (Signed)
I have spent 5 minutes in review of e-visit questionnaire, review and updating patient chart, medical decision making and response to patient.   Tracy Gerken Cody Yosselyn Tax, PA-C    

## 2021-01-23 NOTE — Progress Notes (Signed)
E-Visit for Sinus Problems  We are sorry that you are not feeling well.  Here is how we plan to help!  Based on what you have shared with me it looks like you have sinusitis.  Sinusitis is inflammation and infection in the sinus cavities of the head.  Based on your presentation I believe you most likely have Acute Bacterial Sinusitis.  This is an infection caused by bacteria and is treated with antibiotics. I have prescribed Augmentin 875mg /125mg  one tablet twice daily with food, for 7 days. You may use an oral decongestant such as Mucinex D or if you have glaucoma or high blood pressure use plain Mucinex. Saline nasal spray help and can safely be used as often as needed for congestion.  If you develop worsening sinus pain, fever or notice severe headache and vision changes, or if symptoms are not better after completion of antibiotic, please schedule an appointment with a health care provider.    If you are still taking Warfarin (Coumadin) you need to let the managing provider know you are on an antibiotic so they can tell you when to get a recheck of your warfarin levels.   Sinus infections are not as easily transmitted as other respiratory infection, however we still recommend that you avoid close contact with loved ones, especially the very young and elderly.  Remember to wash your hands thoroughly throughout the day as this is the number one way to prevent the spread of infection!  Home Care: Only take medications as instructed by your medical team. Complete the entire course of an antibiotic. Do not take these medications with alcohol. A steam or ultrasonic humidifier can help congestion.  You can place a towel over your head and breathe in the steam from hot water coming from a faucet. Avoid close contacts especially the very young and the elderly. Cover your mouth when you cough or sneeze. Always remember to wash your hands.  Get Help Right Away If: You develop worsening fever or sinus  pain. You develop a severe head ache or visual changes. Your symptoms persist after you have completed your treatment plan.  Make sure you Understand these instructions. Will watch your condition. Will get help right away if you are not doing well or get worse.  Thank you for choosing an e-visit.  Your e-visit answers were reviewed by a board certified advanced clinical practitioner to complete your personal care plan. Depending upon the condition, your plan could have included both over the counter or prescription medications.  Please review your pharmacy choice. Make sure the pharmacy is open so you can pick up prescription now. If there is a problem, you may contact your provider through and have the prescription routed to another pharmacy.  Your safety is important to [employer]. If you have drug allergies check your prescription carefully.   For the next 24 hours you can use MyChart to ask questions about today's visit, request a non-urgent call back, or ask for a work or school excuse. You will get an email in the next two days asking about your experience. I hope that your e-visit has been valuable and will speed your recovery.

## 2021-02-02 ENCOUNTER — Other Ambulatory Visit: Payer: Self-pay | Admitting: Family

## 2021-02-04 ENCOUNTER — Encounter: Payer: BC Managed Care – PPO | Admitting: Physical Therapy

## 2021-02-11 ENCOUNTER — Encounter: Payer: BC Managed Care – PPO | Admitting: Physical Therapy

## 2021-02-18 ENCOUNTER — Encounter: Payer: BC Managed Care – PPO | Admitting: Physical Therapy

## 2021-02-20 ENCOUNTER — Ambulatory Visit
Admission: RE | Admit: 2021-02-20 | Discharge: 2021-02-20 | Disposition: A | Discharge status: Home / Self Care | Payer: BC Managed Care – PPO | Source: Ambulatory Visit | Attending: Obstetrics and Gynecology | Admitting: Obstetrics and Gynecology

## 2021-02-20 ENCOUNTER — Other Ambulatory Visit: Payer: Self-pay

## 2021-02-20 DIAGNOSIS — N6310 Unspecified lump in the right breast, unspecified quadrant: Secondary | ICD-10-CM

## 2021-02-25 ENCOUNTER — Encounter: Payer: BC Managed Care – PPO | Admitting: Physical Therapy

## 2021-03-24 ENCOUNTER — Ambulatory Visit: Payer: BC Managed Care – PPO

## 2021-03-25 ENCOUNTER — Ambulatory Visit: Payer: BC Managed Care – PPO | Attending: Obstetrics and Gynecology

## 2021-03-25 ENCOUNTER — Other Ambulatory Visit: Payer: Self-pay

## 2021-03-25 DIAGNOSIS — R279 Unspecified lack of coordination: Secondary | ICD-10-CM | POA: Insufficient documentation

## 2021-03-25 DIAGNOSIS — N814 Uterovaginal prolapse, unspecified: Secondary | ICD-10-CM | POA: Diagnosis not present

## 2021-03-25 DIAGNOSIS — M6281 Muscle weakness (generalized): Secondary | ICD-10-CM | POA: Insufficient documentation

## 2021-03-25 NOTE — Therapy (Addendum)
OUTPATIENT PHYSICAL THERAPY FEMALE PELVIC EVALUATION   Patient Name: Melinda Lopez MRN: [id number] DOB:[birth year], 61 y.o., female Today's Date: 03/25/2021   PT End of Session - 03/25/21 1208     Visit Number 1    Date for PT Re-Evaluation 06/17/21    Authorization Type BC[employer]    PT Start Time 1148    PT Stop Time 1230    PT Time Calculation (min) 42 min    Activity Tolerance Patient tolerated treatment well    Behavior During Therapy [facility] for tasks assessed/performed             Past Medical History:  Diagnosis Date   Carotid artery stenosis    Chronic headaches    Fatty liver 04/07/2016   Hx of adenomatous polyp of colon 07/30/2011   Hx of blood clots    fingers   Hyperlipemia    Hypertension    Hypothyroidism    Migraine 1982   pt states migraines since then.    PFO (patent foramen ovale)    TIA (transient ischemic attack)    Past Surgical History:  Procedure Laterality Date   BREAST LUMPECTOMY Right 2000   CAROTID ARTERY - SUBCLAVIAN ARTERY BYPASS GRAFT Right 2006   COLONOSCOPY     UPPER GASTROINTESTINAL ENDOSCOPY     Patient Active Problem List   Diagnosis Date Noted   Low back pain 07/26/2020   Essential hypertension 07/26/2020   Arthritis of metatarsophalangeal (MTP) joint of great toe 07/20/2018   Lupus anticoagulant disorder (HCC) 06/16/2017   Carotid stenosis, asymptomatic, bilateral 09/24/2016   H/O carotid endarterectomy 08/26/2016   TIA (transient ischemic attack) 08/26/2016   Coagulation disorder (HCC) 08/26/2016   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   Greater trochanteric bursitis of right hip 02/28/2015   Patellofemoral syndrome 02/28/2015   Long term current use of anticoagulant therapy 02/25/2015   Hypercoagulable state (HCC) 02/25/2015   Fatty liver 05/29/2014   Preventative health care 04/12/2014   History of blood clots 03/15/2014   Patent foramen ovale 03/15/2014   History of carotid artery stenosis 03/15/2014   Migraine without status  migrainosus, not intractable 03/15/2014   Hypothyroidism 03/15/2014   Hyperlipidemia 03/15/2014   History of cardiovascular disorder 03/15/2014   Hx of adenomatous polyp of colon 07/30/2011    PCP: [name], NP  REFERRING PROVIDER: Milas Hock, MD  REFERRING DIAG: N81.4 (ICD-10-CM) - Prolapse, uterovaginal  THERAPY DIAG:  Muscle weakness (generalized)  Unspecified lack of coordination  ONSET DATE: 10/03/2020   SUBJECTIVE:  SUBJECTIVE STATEMENT: Pt states that she starting noticing a bulge in vagina Labor Day weekend 2022; prior to this, she noticed a tightness and pulling in lower abdomen that was bothersome. This discomfort even traveled into hips and low back. She saw MD for this a year ago and symptoms were attributed to low back. [place] and x-rays performed with no significant findings. She then went to see OBGYN at that point, around when the bulge presented over Labor Day, and was diagnosed with prolapse. MD discussed all options and pt decided to begin with PT. She is not having abdominal pain any more, but she does feel like hips and low back get very fatigued.  Fluid intake: Yes: 5 glasses  caffeine  Patient confirms identification and approves PT to assess pelvic floor and treatment Yes  PERTINENT HISTORY:  2 vaginal deliveries (2 episiotomies); tubal ligation; migraines Sexual abuse: No  PAIN:  Are you having pain? No    BOWEL MOVEMENT Pain with bowel movement: No Type of bowel movement:Frequency after each meal Fully empty rectum: No Leakage: No Pads: No Fiber supplement: No  URINATION Pain with urination: No Fully empty bladder: Yes: - Stream: Strong Urgency: Yes: needs to go quickly Frequency: every couple of hours  Leakage:  sometimes in the bathroom right  before going Pads: No  INTERCOURSE Pain with intercourse:  no Ability to have vaginal penetration:  Yes: not currently performing Types of stimulation: foreplay Climax: Yes: no pain Marinoff Scale   PREGNANCY Vaginal deliveries 2 Tearing No C-section deliveries 0 Currently pregnant No   PRECAUTIONS: None  WEIGHT BEARING RESTRICTIONS No  FALLS:  Has patient fallen in last 6 months? No, Number of falls: 0  LIVING ENVIRONMENT: Lives with: lives with their family Lives in: House/apartment  OCCUPATION: Works for [employer]  PLOF: Independent  PATIENT GOALS prevent prolapse from getting worse and decrease   OBJECTIVE:   COGNITION:  Overall cognitive status: Within functional limits for tasks assessed     PALPATION: Internal Pelvic Floor: Mild tenderness in Rt levator ani; more notable tension and discomfort in Lt levator ani  External Perineal Exam anterior vaginal wall laxity noted with bearing down      PELVIC MMT:   MMT  03/25/2021  Vaginal 2/5  Internal Anal Sphincter 2/5  External Anal Sphincter   Puborectalis   Diastasis Recti 3 finger widths  (Blank rows = not tested)  Pelvic floor endurance 4 seconds. Pelvic floor contractions 6x.   TONE: WNL  PROLAPSE: Grade 2 anterior vaginal wall laxity   TODAY'S TREATMENT  EVAL Low complexity   PATIENT EDUCATION:  Education details: Pt education performed on pelvic floor anatomy, safe to have vaginal penetration, and pressure management; initial HEP provided.  Person educated: Patient Education method: Explanation, Demonstration, Tactile cues, Verbal cues, and Handouts Education comprehension: verbalized understanding   HOME EXERCISE PROGRAM: [id number]  ASSESSMENT:  CLINICAL IMPRESSION: Patient is a 61 y.o. female who was seen today for physical therapy evaluation and treatment for sensation of vaginal bulging. Exam findings notable for pelvic floor weakness 2/5, decreased pelvic floor  endurance 4 seconds, quick flicks 6 times, anterior vaginal wall laxity grade 2, and poor coordination of pelvic floor contraction. Signs and symptoms most consistent with history of poor bladder habits, pelvic floor weakness/vaginal wall laxity, and poor pressure management/deep core coordination. Initial treatment consisted of pelvic floor contraction training and pressure management education. She will benefit from skilled PT intervention in order to reduce symptoms of vaginal bulging, address impairments,  and improve QOL.     OBJECTIVE IMPAIRMENTS decreased activity tolerance, decreased coordination, decreased endurance, decreased mobility, decreased ROM, decreased strength, hypomobility, increased fascial restrictions, increased muscle spasms, impaired flexibility, and pain.   ACTIVITY LIMITATIONS community activity.   PERSONAL FACTORS 1 comorbidity: 2 vaginal deliveries  are also affecting patient's functional outcome.    REHAB POTENTIAL: Good  CLINICAL DECISION MAKING: Stable/uncomplicated  EVALUATION COMPLEXITY: Low   GOALS: Goals reviewed with patient? Yes  SHORT TERM GOALS:  STG Name Target Date Goal status  1 Pt will be independent with HEP.  Baseline:  04/22/2021 INITIAL  2 Pt will be able to teach back and utilize urge suppression technique in order to help reduce number of trips to the bathroom.   Baseline:  04/22/2021 INITIAL  3 Pt will be able to correctly perform diaphragmatic breathing and appropriate pressure management in order to prevent worsening vaginal wall laxity and improve pelvic floor A/ROM.  Baseline: 04/22/2021 INITIAL                       LONG TERM GOALS:   LTG Name Target Date Goal status  1 Pt will be independent with advanced HEP.  Baseline: 06/17/2021 INITIAL  2 Pt will demonstrate normal pelvic floor muscle tone and A/ROM, able to achieve 4/5 strength with contractions and 10 sec endurance, in order to provide appropriate lumbopelvic support in  functional activities.  Baseline: 06/17/2021 INITIAL  3 Pt will be able to go 2-3 hours in between voids without urgency or incontinence in order to improve QOL and perform all functional activities with less difficulty.  Baseline: 06/17/2021 INITIAL                       PLAN: PT FREQUENCY: 1x/week  PT DURATION: 12 weeks  PLANNED INTERVENTIONS: Therapeutic exercises, Therapeutic activity, Neuro Muscular re-education, Balance training, Gait training, Patient/Family education, Joint mobilization, and Dry Needling  PLAN FOR NEXT SESSION: Re-evaluate pelvic floor tension and coordination; progress pelvic floor strengthening if improved; teach urge suppression technique; begin hip/core strengthening   Marisue Ivan, PT 03/25/2021, 1:54 PM

## 2021-04-14 ENCOUNTER — Other Ambulatory Visit: Payer: Self-pay | Admitting: Obstetrics and Gynecology

## 2021-04-14 ENCOUNTER — Encounter: Payer: Self-pay | Admitting: Family

## 2021-04-14 DIAGNOSIS — M545 Low back pain, unspecified: Secondary | ICD-10-CM

## 2021-04-14 DIAGNOSIS — Z1231 Encounter for screening mammogram for malignant neoplasm of breast: Secondary | ICD-10-CM

## 2021-04-26 ENCOUNTER — Other Ambulatory Visit: Payer: Self-pay | Admitting: Family

## 2021-04-29 ENCOUNTER — Ambulatory Visit (INDEPENDENT_AMBULATORY_CARE_PROVIDER_SITE_OTHER): Payer: BC Managed Care – PPO | Admitting: Family

## 2021-04-29 ENCOUNTER — Other Ambulatory Visit: Payer: Self-pay

## 2021-04-29 ENCOUNTER — Encounter: Payer: Self-pay | Admitting: Family

## 2021-04-29 VITALS — BP 154/71 | HR 71 | Temp 98.6°F | Resp 16 | Ht 67.0 in | Wt 203.0 lb

## 2021-04-29 DIAGNOSIS — Z23 Encounter for immunization: Secondary | ICD-10-CM | POA: Diagnosis not present

## 2021-04-29 DIAGNOSIS — E039 Hypothyroidism, unspecified: Secondary | ICD-10-CM

## 2021-04-29 DIAGNOSIS — H409 Unspecified glaucoma: Secondary | ICD-10-CM

## 2021-04-29 DIAGNOSIS — Z0000 Encounter for general adult medical examination without abnormal findings: Secondary | ICD-10-CM | POA: Diagnosis not present

## 2021-04-29 DIAGNOSIS — D689 Coagulation defect, unspecified: Secondary | ICD-10-CM

## 2021-04-29 DIAGNOSIS — E785 Hyperlipidemia, unspecified: Secondary | ICD-10-CM | POA: Diagnosis not present

## 2021-04-29 DIAGNOSIS — I10 Essential (primary) hypertension: Secondary | ICD-10-CM

## 2021-04-29 DIAGNOSIS — G43909 Migraine, unspecified, not intractable, without status migrainosus: Secondary | ICD-10-CM | POA: Diagnosis not present

## 2021-04-29 DIAGNOSIS — F40243 Fear of flying: Secondary | ICD-10-CM

## 2021-04-29 LAB — LIPID PANEL
Cholesterol: 153 mg/dL (ref 0–200)
HDL: 69.5 mg/dL (ref >39.00)
LDL Cholesterol: 69 mg/dL (ref 0–99)
NonHDL: 83.88
Total CHOL/HDL Ratio: 2
Triglycerides: 74 mg/dL (ref 0.0–149.0)
VLDL: 14.8 mg/dL (ref 0.0–40.0)

## 2021-04-29 LAB — COMPREHENSIVE METABOLIC PANEL
ALT: 47 U/L — ABNORMAL HIGH (ref 0–35)
AST: 36 U/L (ref 0–37)
Albumin: 4.7 g/dL (ref 3.5–5.2)
Alkaline Phosphatase: 70 U/L (ref 39–117)
BUN: 13 mg/dL (ref 6–23)
CO2: 28 mEq/L (ref 19–32)
Calcium: 9.9 mg/dL (ref 8.4–10.5)
Chloride: 104 mEq/L (ref 96–112)
Creatinine, Ser: 0.75 mg/dL (ref 0.40–1.20)
GFR: 86.55 mL/min (ref >60.00)
Glucose, Bld: 91 mg/dL (ref 70–99)
Potassium: 3.9 mEq/L (ref 3.5–5.1)
Sodium: 140 mEq/L (ref 135–145)
Total Bilirubin: 0.5 mg/dL (ref 0.2–1.2)
Total Protein: 7.1 g/dL (ref 6.0–8.3)

## 2021-04-29 LAB — TSH: TSH: 0.64 u[IU]/mL (ref 0.35–5.50)

## 2021-04-29 MED ORDER — EPINEPHRINE 0.3 MG/0.3ML IJ SOAJ: 0.3000 mg | INTRAMUSCULAR | 2 refills | Status: DC | PRN

## 2021-04-29 MED ORDER — TRIAMCINOLONE ACETONIDE 0.1 % EX CREA: 1.0000 "application " | TOPICAL_CREAM | Freq: Two times a day (BID) | CUTANEOUS | 0 refills | Status: DC

## 2021-04-29 MED ORDER — AMLODIPINE BESYLATE 5 MG PO TABS: 5.0000 mg | ORAL_TABLET | Freq: Every day | ORAL | 1 refills | Status: DC

## 2021-04-29 MED ORDER — AMLODIPINE BESYLATE 2.5 MG PO TABS: 2.5000 mg | ORAL_TABLET | Freq: Every day | ORAL | 1 refills | Status: DC

## 2021-04-29 MED ORDER — ATORVASTATIN CALCIUM 20 MG PO TABS: 20.0000 mg | ORAL_TABLET | Freq: Every day | ORAL | 1 refills | Status: DC

## 2021-04-29 MED ORDER — LEVOTHYROXINE SODIUM 100 MCG PO TABS: 100.0000 ug | ORAL_TABLET | Freq: Every day | ORAL | 1 refills | Status: DC

## 2021-04-29 MED ORDER — SUMATRIPTAN SUCCINATE 50 MG PO TABS: ORAL_TABLET | ORAL | 2 refills | Status: DC

## 2021-04-29 MED ORDER — ERYTHROMYCIN 5 MG/GM OP OINT: 1.0000 "application " | TOPICAL_OINTMENT | Freq: Three times a day (TID) | OPHTHALMIC | 0 refills | Status: DC

## 2021-04-29 NOTE — Patient Instructions (Signed)
Please complete lab work prior to leaving.   

## 2021-04-29 NOTE — Assessment & Plan Note (Signed)
Uses xanax prn.  Continue same.  ?

## 2021-04-29 NOTE — Assessment & Plan Note (Signed)
Lab Results  ?Component Value Date  ? CHOL 135 10/26/2019  ? HDL 62 10/26/2019  ? LDLCALC 60 10/26/2019  ? TRIG 46 10/26/2019  ? CHOLHDL 2.2 10/26/2019  ? ?Tolerating atorvastatin10mg . Continue same. Obtain follow up lipid panel.  ?

## 2021-04-29 NOTE — Assessment & Plan Note (Addendum)
Patient is maintained on coumadin. This is managed by the coumadin clinic at [place].  ?

## 2021-04-29 NOTE — Assessment & Plan Note (Signed)
BP is elevated. Will increase amlodipine from 2.5mg  to 5mg .  ?

## 2021-04-29 NOTE — Assessment & Plan Note (Signed)
Uncontrolled. Will refer to neurology for further evaluation.  ?

## 2021-04-29 NOTE — Progress Notes (Signed)
? ?Subjective:  ? ?By signing my name below, I, Melinda Lopez, attest that this documentation has been prepared under the direction and in the presence of Melinda Lopez, Melinda Lopez, 04/29/2021   ? ? Patient ID: Melinda Lopez    DOB: 07/04/1960, 61 y.o.   MRN: [id number] ? ?Chief Complaint  ?Patient presents with  ? Annual Exam  ? ? ?HPI ?Patient is in today for a comprehensive physical exam ? ?Migraines - She complains of recent migraines. Her symptoms prevent her from getting out of bed at times. She states that she feels fluttering in her ears. She denies of any stress or change of appetite. She states that 50 MG of Sumatriptan did not help her symptoms. Cold compression relieves her symptoms.  ? ?Refill - She is requesting a refill of erythromycin gel (which she likes to keep on hand in case "I scratch my eye when I travel" and Triamcinolone Acetonide to use as needed for rash. She is also requesting a refill of 50 MG of Sumatriptan ? ?Hypothyroidism - Her thyroid levels are fine. She is continuing to take 100 MG of Synthroid.  ?Lab Results  ?Component Value Date  ? TSH 0.79 07/26/2020  ? ?Cholesterol - She is continuing to take 20 MG of Atorvastatin. She reports no problems with the medication. ?Lab Results  ?Component Value Date  ? CHOL 135 10/26/2019  ? HDL 62 10/26/2019  ? LDLCALC 60 10/26/2019  ? TRIG 46 10/26/2019  ? CHOLHDL 2.2 10/26/2019  ? ?Propranolol - She does not take 40 MG of Propranolol but she has on hand in case of public speaking ? ?Blood Pressure - As of today's visit, her blood pressure is elevated. She is continuing to take 2.5 MG of Amlodipine.  ?BP Readings from Last 3 Encounters:  ?04/29/21 (!) 154/71  ?12/06/20 (!) 153/80  ?07/26/20 140/68  ? ? ?She denies having any fever, ear pain, new muscle pain, new moles, congestion, sinus pain, sore throat, palpations, wheezing, n/v/d, constipation, blood in stool, dysuria, frequency, hematuria, depresssion and anxiety at this  time. ? ?Social History: She reports no new changes to surgical and family history.  ?Colonoscopy: Last completed on 09/28/2017 ?Dexa: Last completed 04/23/2016 ?Pap Smear: Last completed 06/15/2017 ?Mammogram: Last completed 02/20/2021. She is schedule  ?Immunizations: She denies the offer to receive the Shingrix vaccine. She has received the two COVID - 19 vaccines on 04/20/2019 and 05/30/2019 and the booster on 01/17/2020. She has not received the bivalent COVID - 19 vaccine. She is interested in receiving the tetanus vaccine. ?Diet: ?Vision: She is scheduled for a vision exam on 06/2021. She was recently diagnosed with Glaucoma.  ? ?Health Maintenance Due  ?Topic Date Due  ? COVID-19 Vaccine (4 - Booster for Pfizer series) 03/13/2020  ? PAP SMEAR-Modifier  06/15/2020  ? ? ?Past Medical History:  ?Diagnosis Date  ? Carotid artery stenosis   ? Chronic headaches   ? Fatty liver 04/07/2016  ? Glaucoma   ? Hx of adenomatous polyp of colon 07/30/2011  ? Hx of blood clots   ? fingers  ? Hyperlipemia   ? Hypertension   ? Hypothyroidism   ? Migraine 1982  ? pt states migraines since then.   ? PFO (patent foramen ovale)   ? TIA (transient ischemic attack)   ? ? ?Past Surgical History:  ?Procedure Laterality Date  ? BREAST LUMPECTOMY Right 2000  ? CAROTID ARTERY - SUBCLAVIAN ARTERY BYPASS GRAFT Right 2006  ? COLONOSCOPY    ?  UPPER GASTROINTESTINAL ENDOSCOPY    ? ? ?Family History  ?Problem Relation Age of Onset  ? Heart disease Mother   ? Kidney disease Mother   ? Heart disease Father   ? Diabetes Sister   ? Kidney disease Sister   ? Heart disease Maternal Grandmother   ? Heart murmur Brother   ? ? ?Social History  ? ?Socioeconomic History  ? Marital status: Married  ?  Spouse name: Not on file  ? Number of children: 2  ? Years of education: Not on file  ? Highest education level: Not on file  ?Occupational History  ? Not on file  ?Tobacco Use  ? Smoking status: Former  ?  Types: Cigarettes  ?  Quit date: 07/29/2002  ?   Years since quitting: 18.7  ? Smokeless tobacco: Never  ?Vaping Use  ? Vaping Use: Never used  ?Substance and Sexual Activity  ? Alcohol use: Yes  ?  Alcohol/week: 0.0 standard drinks  ?  Comment: 2 per day  ? Drug use: No  ? Sexual activity: Yes  ?Other Topics Concern  ? Not on file  ?Social History Narrative  ? Former smoker- quit in the 90's  ? Married  ?  2 children: 49- daughter Melinda Lopez- lives in [place]  ? 1990 Melinda Lopez- lives locally  ? 2 grandchildren in [place]  ? Works in [employer]/ [employer]  ? ?Social Determinants of Health  ? ?Financial Resource Strain: Not on file  ?Food Insecurity: Not on file  ?Transportation Needs: Not on file  ?Physical Activity: Not on file  ?Stress: Not on file  ?Social Connections: Not on file  ?Intimate Partner Violence: Not on file  ? ? ?Outpatient Medications Prior to Visit  ?Medication Sig Dispense Refill  ? ALPRAZolam (XANAX) 0.5 MG tablet Take 1/2-1 tablet by mouth prior to flying. 20 tablet 0  ? fluticasone (FLONASE) 50 MCG/ACT nasal spray PLACE 1 SPRAY INTO BOTH NOSTRILS 2 (TWO) TIMES DAILY 16 mL 6  ? influenza vac split quadrivalent PF (FLUARIX) 0.5 ML injection Inject into the muscle. 0.5 mL 0  ? propranolol (INDERAL) 40 MG tablet 1 tablet by mouth 60-90 minutes prior to public speaking    ? warfarin (COUMADIN) 10 MG tablet Take 10 mg by mouth daily.    ? amLODipine (NORVASC) 2.5 MG tablet TAKE 1 TABLET BY MOUTH EVERY DAY 30 tablet 5  ? atorvastatin (LIPITOR) 20 MG tablet TAKE 1 TABLET BY MOUTH EVERY DAY 90 tablet 1  ? EPINEPHrine 0.3 mg/0.3 mL IJ SOAJ injection Inject 0.3 mLs (0.3 mg total) into the muscle as needed for anaphylaxis. Use as needed for severe allergic reaction 2 each 2  ? levothyroxine (SYNTHROID) 100 MCG tablet TAKE 1 TABLET BY MOUTH EVERY DAY 30 tablet 5  ? SUMAtriptan (IMITREX) 50 MG tablet MAY REPEAT IN 2 HOURS IF HEADACHE PERSISTS OR RECURS. (MAX 2 TABS/24 HR) 10 tablet 2  ? amoxicillin-clavulanate (AUGMENTIN) 875-125 MG  tablet Take 1 tablet by mouth 2 (two) times daily. 14 tablet 0  ? ?Facility-Administered Medications Prior to Visit  ?Medication Dose Route Frequency Provider Last Rate Last Admin  ? 0.9 %  sodium chloride infusion   Intravenous PRN Manson Passey, PA      ? ? ?Allergies  ?Allergen Reactions  ? Shellfish Allergy Anaphylaxis  ? Codeine Nausea Only  ? Morphine And Related Other (See Comments)  ?  slurring words  ? Phenergan [Promethazine] Other (See Comments)  ?  Causes blood presser  to bottom out  ? Sulfa Antibiotics Rash  ? ? ?Review of Systems  ?Constitutional:  Negative for fever.  ?HENT:  Negative for congestion, ear pain, sinus pain and sore throat.   ?     (+) Fluttering in the ear  ?Respiratory:  Negative for wheezing.   ?Cardiovascular:  Negative for palpitations.  ?Gastrointestinal:  Negative for blood in stool, constipation, diarrhea, nausea and vomiting.  ?Genitourinary:  Negative for dysuria, frequency and hematuria.  ?Musculoskeletal:  Positive for joint pain. Negative for myalgias.  ?Skin:   ?     (-) [place]  ?Neurological:  Positive for headaches.  ?Psychiatric/Behavioral:  Negative for depression. The patient is not nervous/anxious.   ? ?   ?Objective:  ?  ?Physical Exam ?Constitutional:   ?   General: She is not in acute distress. ?   Appearance: Normal appearance. She is not ill-appearing.  ?HENT:  ?   Head: Normocephalic and atraumatic.  ?   Right Ear: Tympanic membrane, ear canal and external ear normal.  ?   Left Ear: Tympanic membrane, ear canal and external ear normal.  ?Eyes:  ?   Extraocular Movements: Extraocular movements intact.  ?   Pupils: Pupils are equal, round, and reactive to light.  ?Neck:  ?   Thyroid: No thyromegaly.  ?Cardiovascular:  ?   Rate and Rhythm: Normal rate and regular rhythm.  ?   Heart sounds: Normal heart sounds. No murmur heard. ?  No gallop.  ?Pulmonary:  ?   Effort: Pulmonary effort is normal. No respiratory distress.  ?   Breath sounds: Normal breath  sounds. No wheezing or rales.  ?Abdominal:  ?   General: Bowel sounds are normal. There is no distension.  ?   Palpations: Abdomen is soft.  ?   Tenderness: There is no abdominal tenderness. There is no guarding.  ?Muscu

## 2021-04-29 NOTE — Assessment & Plan Note (Addendum)
Discussed diet/exercise/weight loss.  She will update her dexa scan and pap smear with her GYN.  Declines shingrix. Td booster today.  Encouraged her to consider the bivalent covid booster. Colo/mammo up to date.  ?

## 2021-04-29 NOTE — Assessment & Plan Note (Addendum)
Wt Readings from Last 3 Encounters:  ?04/29/21 203 lb (92.1 kg)  ?12/06/20 195 lb (88.5 kg)  ?07/26/20 194 lb (88 kg)  ? ?Lab Results  ?Component Value Date  ? TSH 0.79 07/26/2020  ? ?Clinically stable on current dose of synthroid. Continue same.  ?

## 2021-04-30 ENCOUNTER — Encounter: Payer: Self-pay | Admitting: Neurology

## 2021-05-01 NOTE — Therapy (Incomplete)
?OUTPATIENT PHYSICAL THERAPY TREATMENT NOTE ? ? ?Patient Name: Melinda Lopez ?MRN: [id number] ?DOB:July 19, 1960, 61 y.o., female ?Today's Date: 05/01/2021 ? ?PCP: Debbrah Alar, NP ?REFERRING PROVIDER: Debbrah Alar, NP ? ? ? ?Past Medical History:  ?Diagnosis Date  ? Carotid artery stenosis   ? Chronic headaches   ? Fatty liver 04/07/2016  ? Glaucoma   ? Hx of adenomatous polyp of colon 07/30/2011  ? Hx of blood clots   ? fingers  ? Hyperlipemia   ? Hypertension   ? Hypothyroidism   ? Migraine 1982  ? pt states migraines since then.   ? PFO (patent foramen ovale)   ? TIA (transient ischemic attack)   ? ?Past Surgical History:  ?Procedure Laterality Date  ? BREAST LUMPECTOMY Right 2000  ? CAROTID ARTERY - SUBCLAVIAN ARTERY BYPASS GRAFT Right 2006  ? COLONOSCOPY    ? UPPER GASTROINTESTINAL ENDOSCOPY    ? ?Patient Active Problem List  ? Diagnosis Date Noted  ? Glaucoma 04/29/2021  ? Low back pain 07/26/2020  ? Essential hypertension 07/26/2020  ? Arthritis of metatarsophalangeal (MTP) joint of great toe 07/20/2018  ? Lupus anticoagulant disorder ([place]) 06/16/2017  ? Carotid stenosis, asymptomatic, bilateral 09/24/2016  ? H/O carotid endarterectomy 08/26/2016  ? TIA (transient ischemic attack) 08/26/2016  ? Coagulation disorder ([place]) 08/26/2016  ? 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  ? Greater trochanteric bursitis of right hip 02/28/2015  ? Patellofemoral syndrome 02/28/2015  ? Long term current use of anticoagulant therapy 02/25/2015  ? Hypercoagulable state ([place]) 02/25/2015  ? Fatty liver 05/29/2014  ? Preventative health care 04/12/2014  ? History of blood clots 03/15/2014  ? Patent foramen ovale 03/15/2014  ? History of carotid artery stenosis 03/15/2014  ? Migraine without status migrainosus, not intractable 03/15/2014  ? Hypothyroidism 03/15/2014  ? Hyperlipidemia 03/15/2014  ? History of cardiovascular disorder 03/15/2014  ? Hx of adenomatous polyp of colon 07/30/2011  ? ? ?REFERRING DIAG: N81.4 (ICD-10-CM) -  Prolapse, uterovaginal ? ?THERAPY DIAG:  ?No diagnosis found. ? ?PERTINENT HISTORY: 2 vaginal deliveries (2 episiotomies); tubal ligation; migraines ? ?PRECAUTIONS: NA ? ?SUBJECTIVE: *** ? ?PAIN:  ?Are you having pain? {[id number]} ? ? ?SUBJECTIVE 03/25/21:                                                                                                                                                                                          ?  ?SUBJECTIVE STATEMENT: ?Pt states that she starting noticing a bulge in vagina Labor Day weekend 2022; prior to this, she noticed a tightness and pulling in lower abdomen that was bothersome. This discomfort even traveled into hips and low back.  She saw MD for this a year ago and symptoms were attributed to low back. [place] and x-rays performed with no significant findings. She then went to see OBGYN at that point, around when the bulge presented over Labor Day, and was diagnosed with prolapse. MD discussed all options and pt decided to begin with PT. She is not having abdominal pain any more, but she does feel like hips and low back get very fatigued.  ?Fluid intake: Yes: 5 glasses  caffeine ?  ?Patient confirms identification and approves PT to assess pelvic floor and treatment Yes ?  ?PERTINENT HISTORY:  ?2 vaginal deliveries (2 episiotomies); tubal ligation; migraines ?Sexual abuse: No ?  ?PAIN:  ?Are you having pain? No ?  ?  ?  ?BOWEL MOVEMENT ?Pain with bowel movement: No ?Type of bowel movement:Frequency after each meal ?Fully empty rectum: No ?Leakage: No ?Pads: No ?Fiber supplement: No ?  ?URINATION ?Pain with urination: No ?Fully empty bladder: Yes: - ?Stream: Strong ?Urgency: Yes: needs to go quickly ?Frequency: every couple of hours       ?Leakage:  sometimes in the bathroom right before going ?Pads: No ?  ?INTERCOURSE ?Pain with intercourse:  no ?Ability to have vaginal penetration:  Yes: not currently performing ?Types of stimulation: foreplay ?Climax: Yes: no  pain ?Marinoff Scale  ?  ?PREGNANCY ?Vaginal deliveries 2 ?Tearing No ?C-section deliveries 0 ?Currently pregnant No ?  ?  ?PRECAUTIONS: None ?  ?WEIGHT BEARING RESTRICTIONS No ?  ?FALLS:  ?Has patient fallen in last 6 months? No, Number of falls: 0 ?  ?LIVING ENVIRONMENT: ?Lives with: lives with their family ?Lives in: House/apartment ?  ?OCCUPATION: Works for [employer] ?  ?PLOF: Independent ?  ?PATIENT GOALS prevent prolapse from getting worse and decrease ?  ?  ?OBJECTIVE:  ?  ?COGNITION: ?           Overall cognitive status: Within functional limits for tasks assessed              ?  ?  ?PALPATION: ?Internal Pelvic Floor: Mild tenderness in Rt levator ani; more notable tension and discomfort in Lt levator ani ?  ?External Perineal Exam anterior vaginal wall laxity noted with bearing down ?  ?  ?  ?  ?  ?PELVIC MMT: ?  ?MMT   ?03/25/2021  ?Vaginal 2/5  ?Internal Anal Sphincter 2/5  ?External Anal Sphincter    ?Puborectalis    ?Diastasis Recti 3 finger widths  ?(Blank rows = not tested) ?  ?Pelvic floor endurance 4 seconds. ?Pelvic floor contractions 6x.  ?  ?TONE: ?WNL ?  ?PROLAPSE: ?Grade 2 anterior vaginal wall laxity ?  ?  ?TODAY'S TREATMENT  ?EVAL Low complexity ?  ?  ?PATIENT EDUCATION:  ?Education details: Pt education performed on pelvic floor anatomy, safe to have vaginal penetration, and pressure management; initial HEP provided.  ?Person educated: Patient ?Education method: Explanation, Demonstration, Tactile cues, Verbal cues, and Handouts ?Education comprehension: verbalized understanding ?  ?  ?HOME EXERCISE PROGRAM: ?[id number] ?  ?ASSESSMENT: ?  ?CLINICAL IMPRESSION: ?Patient is a 61 y.o. female who was seen today for physical therapy evaluation and treatment for sensation of vaginal bulging. Exam findings notable for pelvic floor weakness 2/5, decreased pelvic floor endurance 4 seconds, quick flicks 6 times, anterior vaginal wall laxity grade 2, and poor coordination of pelvic floor contraction. Signs  and symptoms most consistent with history of poor bladder habits, pelvic floor weakness/vaginal wall laxity, and poor pressure management/deep core coordination. Initial  treatment consisted of pelvic floor contraction training and pressure management education. She will benefit from skilled PT intervention in order to reduce symptoms of vaginal bulging, address impairments, and improve QOL.   ?  ?  ?OBJECTIVE IMPAIRMENTS decreased activity tolerance, decreased coordination, decreased endurance, decreased mobility, decreased ROM, decreased strength, hypomobility, increased fascial restrictions, increased muscle spasms, impaired flexibility, and pain.  ?  ?ACTIVITY LIMITATIONS community activity.  ?  ?PERSONAL FACTORS 1 comorbidity: 2 vaginal deliveries  are also affecting patient's functional outcome.  ?  ?  ?REHAB POTENTIAL: Good ?  ?CLINICAL DECISION MAKING: Stable/uncomplicated ?  ?EVALUATION COMPLEXITY: Low ?  ?  ?GOALS: ?Goals reviewed with patient? Yes ?  ?SHORT TERM GOALS: ?  ?STG Name Target Date Goal status  ?1 Pt will be independent with HEP.  ?Baseline:  04/22/2021 INITIAL  ?2 Pt will be able to teach back and utilize urge suppression technique in order to help reduce number of trips to the bathroom.   ?Baseline:  04/22/2021 INITIAL  ?3 Pt will be able to correctly perform diaphragmatic breathing and appropriate pressure management in order to prevent worsening vaginal wall laxity and improve pelvic floor A/ROM.  ?Baseline: 04/22/2021 INITIAL  ?         ?         ?         ?         ?  ?LONG TERM GOALS:  ?  ?LTG Name Target Date Goal status  ?1 Pt will be independent with advanced HEP.  ?Baseline: 06/17/2021 INITIAL  ?2 Pt will demonstrate normal pelvic floor muscle tone and A/ROM, able to achieve 4/5 strength with contractions and 10 sec endurance, in order to provide appropriate lumbopelvic support in functional activities.  ?Baseline: 06/17/2021 INITIAL  ?3 Pt will be able to go 2-3 hours in between voids  without urgency or incontinence in order to improve QOL and perform all functional activities with less difficulty.  ?Baseline: 06/17/2021 INITIAL  ?         ?         ?         ?         ?  ?PLAN: ?PT FREQ

## 2021-05-06 ENCOUNTER — Ambulatory Visit: Payer: BC Managed Care – PPO

## 2021-05-08 ENCOUNTER — Ambulatory Visit: Payer: BC Managed Care – PPO

## 2021-05-15 ENCOUNTER — Ambulatory Visit: Payer: BC Managed Care – PPO | Attending: Obstetrics and Gynecology

## 2021-05-15 DIAGNOSIS — M6281 Muscle weakness (generalized): Secondary | ICD-10-CM | POA: Diagnosis present

## 2021-05-15 DIAGNOSIS — R279 Unspecified lack of coordination: Secondary | ICD-10-CM | POA: Diagnosis present

## 2021-05-15 NOTE — Therapy (Addendum)
OUTPATIENT PHYSICAL THERAPY TREATMENT NOTE   Patient Name: Melinda Lopez MRN: [id number] DOB:02/12/1960, 61 y.o., female Today's Date: 05/15/2021  PCP: O'Sullivan, Melissa, NP REFERRING PROVIDER: Duncan, Paula, MD  END OF SESSION:   PT End of Session - 05/15/21 1155     Visit Number 2    Date for PT Re-Evaluation 06/17/21    Authorization Type BCBS    PT Start Time 1152    PT Stop Time 1225    PT Time Calculation (min) 33 min    Activity Tolerance Patient tolerated treatment well    Behavior During Therapy WFL for tasks assessed/performed             Past Medical History:  Diagnosis Date   Carotid artery stenosis    Chronic headaches    Fatty liver 04/07/2016   Glaucoma    Hx of adenomatous polyp of colon 07/30/2011   Hx of blood clots    fingers   Hyperlipemia    Hypertension    Hypothyroidism    Migraine 1982   pt states migraines since then.    PFO (patent foramen ovale)    TIA (transient ischemic attack)    Past Surgical History:  Procedure Laterality Date   BREAST LUMPECTOMY Right 2000   CAROTID ARTERY - SUBCLAVIAN ARTERY BYPASS GRAFT Right 2006   COLONOSCOPY     UPPER GASTROINTESTINAL ENDOSCOPY     Patient Active Problem List   Diagnosis Date Noted   Glaucoma 04/29/2021   Low back pain 07/26/2020   Essential hypertension 07/26/2020   Arthritis of metatarsophalangeal (MTP) joint of great toe 07/20/2018   Lupus anticoagulant disorder ([facility]) 06/16/2017   Carotid stenosis, asymptomatic, bilateral 09/24/2016   H/O carotid endarterectomy 08/26/2016   TIA (transient ischemic attack) 08/26/2016   Coagulation disorder ([facility]) 08/26/2016   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   Greater trochanteric bursitis of right hip 02/28/2015   Patellofemoral syndrome 02/28/2015   Long term current use of anticoagulant therapy 02/25/2015   Hypercoagulable state ([facility]) 02/25/2015   Fatty liver 05/29/2014   Preventative health care 04/12/2014   History of blood clots  03/15/2014   Patent foramen ovale 03/15/2014   History of carotid artery stenosis 03/15/2014   Migraine without status migrainosus, not intractable 03/15/2014   Hypothyroidism 03/15/2014   Hyperlipidemia 03/15/2014   History of cardiovascular disorder 03/15/2014   Hx of adenomatous polyp of colon 07/30/2011    REFERRING DIAG: N81.4 (ICD-10-CM) - Prolapse, uterovaginal  THERAPY DIAG:  Muscle weakness (generalized)  Unspecified lack of coordination  PERTINENT HISTORY: 2 vaginal deliveries (2 episiotomies); tubal ligation; migraines  SUBJECTIVE: Pt states that she has been traveling and then sick, which has kept her from PT since her initial evaluation. She reports no changes in vaignal bulge, low back pain/hip pain, and lower body fatigue.    SUBJECTIVE:                                                                                                                                                                                              SUBJECTIVE STATEMENT: Pt states that she starting noticing a bulge in vagina Labor Day weekend 2022; prior to this, she noticed a tightness and pulling in lower abdomen that was bothersome. This discomfort even traveled into hips and low back. She saw MD for this a year ago and symptoms were attributed to low back. US and x-rays performed with no significant findings. She then went to see OBGYN at that point, around when the bulge presented over Labor Day, and was diagnosed with prolapse. MD discussed all options and pt decided to begin with PT. She is not having abdominal pain any more, but she does feel like hips and low back get very fatigued.  Fluid intake: Yes: 5 glasses  caffeine   Patient confirms identification and approves PT to assess pelvic floor and treatment Yes   PERTINENT HISTORY:  2 vaginal deliveries (2 episiotomies); tubal ligation; migraines Sexual abuse: No   PAIN:  Are you having pain? No       BOWEL MOVEMENT Pain with bowel  movement: No Type of bowel movement:Frequency after each meal Fully empty rectum: No Leakage: No Pads: No Fiber supplement: No   URINATION Pain with urination: No Fully empty bladder: Yes: - Stream: Strong Urgency: Yes: needs to go quickly Frequency: every couple of hours       Leakage:  sometimes in the bathroom right before going Pads: No   INTERCOURSE Pain with intercourse:  no Ability to have vaginal penetration:  Yes: not currently performing Types of stimulation: foreplay Climax: Yes: no pain Marinoff Scale    PREGNANCY Vaginal deliveries 2 Tearing No C-section deliveries 0 Currently pregnant No     PRECAUTIONS: None   WEIGHT BEARING RESTRICTIONS No   FALLS:  Has patient fallen in last 6 months? No, Number of falls: 0   LIVING ENVIRONMENT: Lives with: lives with their family Lives in: House/apartment   OCCUPATION: Works for [employer]   PLOF: Independent   PATIENT GOALS prevent prolapse from getting worse and decrease     OBJECTIVE:    COGNITION:            Overall cognitive status: Within functional limits for tasks assessed                  PALPATION: Internal Pelvic Floor: Mild tenderness in Rt levator ani; more notable tension and discomfort in Lt levator ani   External Perineal Exam anterior vaginal wall laxity noted with bearing down           PELVIC MMT:   MMT   03/25/2021  Vaginal 2/5  Internal Anal Sphincter 2/5  External Anal Sphincter    Puborectalis    Diastasis Recti 3 finger widths  (Blank rows = not tested)   Pelvic floor endurance 4 seconds. Pelvic floor contractions 6x.    TONE: WNL   PROLAPSE: Grade 2 anterior vaginal wall laxity     TODAY'S TREATMENT 05/15/21: Manual: Soft tissue mobilization: Lower abdomen Scar tissue mobilization: Myofascial release: Spinal mobilization: Internal pelvic floor techniques: Dry needling: Neuromuscular re-education: Core retraining: 6 minutes Core facilitation: Cat cow  2 x 10 Supine march 3 x 10 Bridge with hip adduction, pelvic floor, and core contraction with breath coordination Form correction: Pelvic floor contraction training: Down training: Exercises: Stretches/mobility: Strengthening: Therapeutic activities: Functional strengthening activities: Self-care:   TREATMENT 2/21 EVAL Low complexity     PATIENT EDUCATION:  Education details: Pt education performed on exercise progressions and core training Person educated: Patient Education method: Explanation, Demonstration,   Tactile cues, Verbal cues, and Handouts Education comprehension: verbalized understanding     HOME EXERCISE PROGRAM: [id number]   ASSESSMENT:   CLINICAL IMPRESSION: Pt is overall the same since previous visit due to not having returned to PT in almost 2 months. She did well with abdominal soft tissue mobilization to help release tension that is contributing to symptoms and release bladder. Core training started and she did well with multimodal cues and breath coordination for better pressure management. She was able to incorporate into other exercise sand HEP updated. She will benefit from skilled PT intervention in order to reduce symptoms of vaginal bulging, address impairments, and improve QOL.       OBJECTIVE IMPAIRMENTS decreased activity tolerance, decreased coordination, decreased endurance, decreased mobility, decreased ROM, decreased strength, hypomobility, increased fascial restrictions, increased muscle spasms, impaired flexibility, and pain.    ACTIVITY LIMITATIONS community activity.    PERSONAL FACTORS 1 comorbidity: 2 vaginal deliveries  are also affecting patient's functional outcome.      REHAB POTENTIAL: Good   CLINICAL DECISION MAKING: Stable/uncomplicated   EVALUATION COMPLEXITY: Low     GOALS: Goals reviewed with patient? Yes   SHORT TERM GOALS:   STG Name Target Date Goal status  1 Pt will be independent with HEP.  Baseline:  04/22/2021  INITIAL  2 Pt will be able to teach back and utilize urge suppression technique in order to help reduce number of trips to the bathroom.   Baseline:  04/22/2021 INITIAL  3 Pt will be able to correctly perform diaphragmatic breathing and appropriate pressure management in order to prevent worsening vaginal wall laxity and improve pelvic floor A/ROM.  Baseline: 04/22/2021 INITIAL                                        LONG TERM GOALS:    LTG Name Target Date Goal status  1 Pt will be independent with advanced HEP.  Baseline: 06/17/2021 INITIAL  2 Pt will demonstrate normal pelvic floor muscle tone and A/ROM, able to achieve 4/5 strength with contractions and 10 sec endurance, in order to provide appropriate lumbopelvic support in functional activities.  Baseline: 06/17/2021 INITIAL  3 Pt will be able to go 2-3 hours in between voids without urgency or incontinence in order to improve QOL and perform all functional activities with less difficulty.  Baseline: 06/17/2021 INITIAL                                        PLAN: PT FREQUENCY: 1x/week   PT DURATION: 12 weeks   PLANNED INTERVENTIONS: Therapeutic exercises, Therapeutic activity, Neuro Muscular re-education, Balance training, Gait training, Patient/Family education, Joint mobilization, and Dry Needling   PLAN FOR NEXT SESSION: Re-evaluate pelvic floor tension and coordination; progress pelvic floor strengthening if improved; teach urge suppression technique; progress core/hip strengthening.     Kristen Steer, PT, DPT04/13/2312:29 PM  PHYSICAL THERAPY DISCHARGE SUMMARY  Visits from Start of Care: 2  Current functional level related to goals / functional outcomes: Not complete   Remaining deficits: See above   Education / Equipment: HEP   Patient agrees to discharge. Patient goals were not met. Patient is being discharged due to not returning since the last visit.  Kristen Steer, PT, DPT05/24/2311:49 AM      

## 2021-05-20 ENCOUNTER — Ambulatory Visit
Admission: RE | Admit: 2021-05-20 | Discharge: 2021-05-20 | Disposition: A | Discharge status: Home / Self Care | Payer: BC Managed Care – PPO | Source: Ambulatory Visit | Attending: Obstetrics and Gynecology | Admitting: Obstetrics and Gynecology

## 2021-05-20 DIAGNOSIS — Z1231 Encounter for screening mammogram for malignant neoplasm of breast: Secondary | ICD-10-CM

## 2021-05-26 NOTE — Progress Notes (Deleted)
ANNUAL EXAM Patient name: Melinda Lopez MRN [id number]  Date of birth: May 08, 1960 Chief Complaint:   No chief complaint on file.  History of Present Illness:   Melinda Lopez is a 61 y.o. [phone] female being seen today for a routine annual exam.   Current complaints: ***  No LMP recorded. Patient is postmenopausal.   The pregnancy intention screening data noted above was reviewed. Potential methods of contraception were discussed. The patient elected to proceed with No data recorded.   Last pap 2019. Results were: NILM w/ HRHPV negative. H/O abnormal pap: {yes/yes***/no:[id number]} Pap/HPV normal 2016, 2019  Health Maintenance Due  Topic Date Due   COVID-19 Vaccine (4 - Booster for Pfizer series) 03/13/2020   PAP SMEAR-Modifier  06/15/2020        07/26/2020    8:57 AM 07/26/2020    8:40 AM 06/07/2020    3:46 PM 06/15/2017    1:10 PM 04/10/2016    7:52 AM  Depression screen PHQ 2/9  Decreased Interest 0 0 0 0 0  Down, Depressed, Hopeless 0 0 0 0 0  PHQ - 2 Score 0 0 0 0 0  Altered sleeping    1 2  Tired, decreased energy    1 2  Change in appetite    1 1  Feeling bad or failure about yourself     0 0  Trouble concentrating    0 0  Moving slowly or fidgety/restless    0 0  Suicidal thoughts    0 0  PHQ-9 Score    3 5         View : No data to display.           Review of Systems:   Pertinent items are noted in HPI Denies any headaches, blurred vision, fatigue, shortness of breath, chest pain, abdominal pain, abnormal vaginal discharge/itching/odor/irritation, problems with periods, bowel movements, urination, or intercourse unless otherwise stated above. *** Pertinent History Reviewed:  Reviewed past medical,surgical, social and family history.  Reviewed problem list, medications and allergies. Physical Assessment:  There were no vitals filed for this visit.There is no height or weight on file to calculate BMI.   Physical Examination:  General appearance -  well appearing, and in no distress Mental status - alert, oriented to person, place, and time Psych:  She has a normal mood and affect Skin - warm and dry, normal color, no suspicious lesions noted Chest - effort normal, all lung fields clear to auscultation bilaterally Heart - normal rate and regular rhythm Neck:  midline trachea, no thyromegaly or nodules Breasts - breasts appear normal, no suspicious masses, no skin or nipple changes or  axillary nodes Abdomen - soft, nontender, nondistended, no masses or organomegaly Pelvic -  VULVA: normal appearing vulva with no masses, tenderness or lesions   VAGINA: normal appearing vagina with normal color and discharge, no lesions   CERVIX: normal appearing cervix without discharge or lesions, no CMT UTERUS: uterus is felt to be normal size, shape, consistency and nontender  ADNEXA: No adnexal masses or tenderness noted. Extremities:  No swelling or varicosities noted  Chaperone present for exam  No results found for this or any previous visit (from the past 24 hour(s)).  Assessment & Plan:  Diagnoses and all orders for this visit:  Encounter for annual routine gynecological examination  - Cervical cancer screening: Discussed guidelines. Pap with HPV done  - Breast Health: Encouraged self breast awareness/SBE. Discussed limits of clinical breast  exam for detecting breast cancer. Discussed importance of annual MXR.  MXR wnl 05/2021 - Climacteric/Sexual health: Reviewed typical and atypical symptoms of menopause/peri-menopause. Discussed PMB and to call if any amount of spotting.  - Bone Health: Calcium via diet and supplementation. Discussed weight bearing exercise. DEXA normal 2018 - Colonoscopy:  09/2017 - F/U 12 months and prn      No orders of the defined types were placed in this encounter.   Meds: No orders of the defined types were placed in this encounter.   Follow-up: No follow-ups on file.  Milas Hock, MD 05/26/2021 6:12  PM

## 2021-05-29 ENCOUNTER — Ambulatory Visit: Payer: BC Managed Care – PPO | Admitting: Obstetrics and Gynecology

## 2021-05-29 ENCOUNTER — Other Ambulatory Visit: Payer: Self-pay | Admitting: Family

## 2021-05-29 DIAGNOSIS — Z01419 Encounter for gynecological examination (general) (routine) without abnormal findings: Secondary | ICD-10-CM

## 2021-06-19 NOTE — Progress Notes (Deleted)
ANNUAL EXAM Patient name: Melinda Lopez MRN [id number]  Date of birth: 08-08-1960 Chief Complaint:   No chief complaint on file.  History of Present Illness:   Melinda Lopez is a 61 y.o. G38P2000 female being seen today for a routine annual exam.   Current complaints: ***  No LMP recorded. Patient is postmenopausal.   Last pap 2019. Results were: NILM w/ HRHPV negative. H/O abnormal pap: no Health Maintenance Due  Topic Date Due   COVID-19 Vaccine (4 - Booster for Pfizer series) 03/13/2020   PAP SMEAR-Modifier  06/15/2020        07/26/2020    8:57 AM 07/26/2020    8:40 AM 06/07/2020    3:46 PM 06/15/2017    1:10 PM 04/10/2016    7:52 AM  Depression screen PHQ 2/9  Decreased Interest 0 0 0 0 0  Down, Depressed, Hopeless 0 0 0 0 0  PHQ - 2 Score 0 0 0 0 0  Altered sleeping    1 2  Tired, decreased energy    1 2  Change in appetite    1 1  Feeling bad or failure about yourself     0 0  Trouble concentrating    0 0  Moving slowly or fidgety/restless    0 0  Suicidal thoughts    0 0  PHQ-9 Score    3 5         View : No data to display.           Review of Systems:   Pertinent items are noted in HPI Denies any headaches, blurred vision, fatigue, shortness of breath, chest pain, abdominal pain, abnormal vaginal discharge/itching/odor/irritation, problems with periods, bowel movements, urination, or intercourse unless otherwise stated above. *** Pertinent History Reviewed:  Reviewed past medical,surgical, social and family history.  Reviewed problem list, medications and allergies. Physical Assessment:  There were no vitals filed for this visit.There is no height or weight on file to calculate BMI.   Physical Examination:  General appearance - well appearing, and in no distress Mental status - alert, oriented to person, place, and time Psych:  She has a normal mood and affect Skin - warm and dry, normal color, no suspicious lesions noted Chest - effort  normal, all lung fields clear to auscultation bilaterally Heart - normal rate and regular rhythm Neck:  midline trachea, no thyromegaly or nodules Breasts - breasts appear normal, no suspicious masses, no skin or nipple changes or  axillary nodes Abdomen - soft, nontender, nondistended, no masses or organomegaly Pelvic -  VULVA: normal appearing vulva with no masses, tenderness or lesions   VAGINA: normal appearing vagina with normal color and discharge, no lesions   CERVIX: normal appearing cervix without discharge or lesions, no CMT UTERUS: uterus is felt to be normal size, shape, consistency and nontender  ADNEXA: No adnexal masses or tenderness noted. Extremities:  No swelling or varicosities noted  Chaperone present for exam  No results found for this or any previous visit (from the past 24 hour(s)).  Assessment & Plan:  Diagnoses and all orders for this visit:  Encounter for annual routine gynecological examination  - Cervical cancer screening: Discussed guidelines. Pap with HPV done - Breast Health: Encouraged self breast awareness/SBE. Discussed limits of clinical breast exam for detecting breast cancer. Discussed importance of annual MXR. MXR wnl 05/2021. - Climacteric/Sexual health: Reviewed typical and atypical symptoms of menopause/peri-menopause. Discussed PMB and to call if any amount of  spotting.  - Colonoscopy:  09/2017 - F/U 12 months and prn     No orders of the defined types were placed in this encounter.   Meds: No orders of the defined types were placed in this encounter.   Follow-up: No follow-ups on file.  Milas Hock, MD 06/19/2021 12:54 PM

## 2021-06-26 ENCOUNTER — Ambulatory Visit: Payer: BC Managed Care – PPO | Admitting: Obstetrics and Gynecology

## 2021-06-26 DIAGNOSIS — Z01419 Encounter for gynecological examination (general) (routine) without abnormal findings: Secondary | ICD-10-CM

## 2021-06-27 ENCOUNTER — Other Ambulatory Visit: Payer: Self-pay | Admitting: Family

## 2021-06-27 MED ORDER — ERYTHROMYCIN 5 MG/GM OP OINT: 1.0000 "application " | TOPICAL_OINTMENT | Freq: Three times a day (TID) | OPHTHALMIC | 0 refills | Status: DC

## 2021-07-20 ENCOUNTER — Other Ambulatory Visit: Payer: Self-pay | Admitting: Family

## 2021-07-20 DIAGNOSIS — G43909 Migraine, unspecified, not intractable, without status migrainosus: Secondary | ICD-10-CM

## 2021-07-21 ENCOUNTER — Telehealth: Payer: Self-pay | Admitting: Family

## 2021-07-21 ENCOUNTER — Ambulatory Visit (HOSPITAL_BASED_OUTPATIENT_CLINIC_OR_DEPARTMENT_OTHER)
Admission: RE | Admit: 2021-07-21 | Discharge: 2021-07-21 | Disposition: A | Discharge status: Home / Self Care | Payer: BC Managed Care – PPO | Source: Ambulatory Visit | Attending: Family | Admitting: Family

## 2021-07-21 ENCOUNTER — Ambulatory Visit (INDEPENDENT_AMBULATORY_CARE_PROVIDER_SITE_OTHER): Payer: BC Managed Care – PPO | Admitting: Family

## 2021-07-21 VITALS — BP 156/86 | HR 60 | Temp 98.3°F | Resp 16 | Wt 200.0 lb

## 2021-07-21 DIAGNOSIS — M25572 Pain in left ankle and joints of left foot: Secondary | ICD-10-CM | POA: Diagnosis not present

## 2021-07-21 DIAGNOSIS — S82892A Other fracture of left lower leg, initial encounter for closed fracture: Secondary | ICD-10-CM

## 2021-07-21 NOTE — Assessment & Plan Note (Signed)
New.   Reviewed x-ray and x-ray reveals:  Nondisplaced avulsion fracture along the medial ankle.  Plan referral to sports medicine. See phone note.

## 2021-07-21 NOTE — Patient Instructions (Signed)
Please complete x-ray on the first floor.  

## 2021-07-21 NOTE — Telephone Encounter (Signed)
Reviewed x-ray results with pt and plans for sports med referral.  Pt verbalizes understanding.

## 2021-07-21 NOTE — Progress Notes (Signed)
Subjective:   By signing my name below, I, Melinda Lopez, attest that this documentation has been prepared under the direction and in the presence of Melinda Craze, NP 07/21/2021    Patient ID: Melinda Lopez, female    DOB: [birth year], 61 y.o.   MRN: [id number]  Chief Complaint  Patient presents with   Joint Swelling    Complains of swelling and soreness on left ankle    HPI Patient is in today for a office visit.  Ankle pain- She complains of swelling and pain in her left ankle. She has bruising on her left ankle and she notes taking 10 mg coumadin regularly.  She was climbing stairs last week and had small trips while going up the stairs a couple of times but had no pain following it. Later that day she mildly hit her left foot and found she had tenderness. The next day she noticed swelling and tenderness. She has no pain while bearing weight on her left ankle. She has no pain while walking. She is interested in getting an X-ray for further screening.    Health Maintenance Due  Topic Date Due   COVID-19 Vaccine (4 - Pfizer series) 03/13/2020   PAP SMEAR-Modifier  06/15/2020    Past Medical History:  Diagnosis Date   Carotid artery stenosis    Chronic headaches    Fatty liver 04/07/2016   Glaucoma    Hx of adenomatous polyp of colon 07/30/2011   Hx of blood clots    fingers   Hyperlipemia    Hypertension    Hypothyroidism    Migraine 1982   pt states migraines since then.    PFO (patent foramen ovale)    TIA (transient ischemic attack)     Past Surgical History:  Procedure Laterality Date   BREAST LUMPECTOMY Right 2000   CAROTID ARTERY - SUBCLAVIAN ARTERY BYPASS GRAFT Right 2006   COLONOSCOPY     UPPER GASTROINTESTINAL ENDOSCOPY      Family History  Problem Relation Age of Onset   Heart disease Mother    Kidney disease Mother    Heart disease Father    Diabetes Sister    Kidney disease Sister    Heart disease Maternal Grandmother    Heart murmur  Brother     Social History   Socioeconomic History   Marital status: Married    Spouse name: Not on file   Number of children: 2   Years of education: Not on file   Highest education level: Not on file  Occupational History   Not on file  Tobacco Use   Smoking status: Former    Types: Cigarettes    Quit date: 07/29/2002    Years since quitting: 18.9   Smokeless tobacco: Never  Vaping Use   Vaping Use: Never used  Substance and Sexual Activity   Alcohol use: Yes    Alcohol/week: 0.0 standard drinks of alcohol    Comment: 2 per day   Drug use: No   Sexual activity: Yes  Other Topics Concern   Not on file  Social History Narrative   Former smoker- quit in the 63's   Married    2 children: 1982- daughter Melinda Lopez- lives in [place] Melinda Lopez- lives locally   2 grandchildren in [place]   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 20 tablet 0   amLODipine (NORVASC) 5 MG tablet Take 1 tablet (5 mg total) by mouth daily. 90 tablet 1   atorvastatin (LIPITOR) 20 MG tablet Take 1 tablet (20 mg total) by mouth daily. 90 tablet 1   enoxaparin (LOVENOX) 150 MG/ML injection Take 130 mg Subcutaneously  as directed every 24 hours     EPINEPHRINE 0.3 mg/0.3 mL IJ SOAJ injection Inject 0.3 mg into the muscle as needed for anaphylaxis. Use as needed for severe allergic reaction 2 each 2   erythromycin ophthalmic ointment Place 1 application. into both eyes 3 (three) times daily. 3.5 g 0   fluticasone (FLONASE) 50 MCG/ACT nasal spray PLACE 1 SPRAY INTO BOTH  NOSTRILS 2 (TWO) TIMES DAILY 16 mL 6   influenza vac split quadrivalent PF (FLUARIX) 0.5 ML injection Inject into the muscle. 0.5 mL 0   levothyroxine (SYNTHROID) 100 MCG tablet TAKE 1 TABLET BY MOUTH EVERY DAY 90 tablet 1   propranolol (INDERAL) 40 MG tablet 1 tablet by mouth 60-90 minutes prior to public speaking     SUMAtriptan (IMITREX) 50 MG tablet MAY REPEAT IN 2 HOURS IF HEADACHE PERSISTS OR RECURS. 10 tablet 2   triamcinolone cream (KENALOG) 0.1 % APPLY 1 APPLICATION. TOPICALLY 2 (TWO) TIMES DAILY. 30 g 0   warfarin (COUMADIN) 10 MG tablet Take 10 mg by mouth daily.     Facility-Administered Medications Prior to Visit  Medication Dose Route Frequency Provider Last Rate Last Admin   0.9 %  sodium chloride infusion   Intravenous PRN Bhagat, Bhavinkumar, PA        Allergies  Allergen Reactions   Shellfish Allergy Anaphylaxis   Codeine Nausea Only   Morphine And Related Other (See Comments)    slurring words   Phenergan [Promethazine] Other (See Comments)    Causes blood presser to bottom out   Sulfa Antibiotics Rash    Review of Systems  Musculoskeletal:  Positive for joint pain (left ankle).       (+)swelling in left ankle  Endo/Heme/Allergies:        (+)bruising on left ankle       Objective:    Physical Exam Constitutional:      General: She is not in acute distress.    Appearance: Normal appearance. She is not ill-appearing.  HENT:     Head: Normocephalic and atraumatic.     Right Ear: External ear normal.     Left Ear: External ear normal.  Eyes:     Extraocular Movements: Extraocular movements intact.     Pupils: Pupils are equal, round, and reactive to light.  Cardiovascular:     Rate and Rhythm: Normal rate and regular rhythm.     Heart sounds: Normal heart sounds. No murmur heard.    No gallop.  Pulmonary:     Effort: Pulmonary effort is normal. No respiratory distress.     Breath sounds: Normal breath sounds. No wheezing or rales.  Musculoskeletal:         General: Swelling (left medial ankle) and tenderness (left medial ankle) present.     Comments: Bruising and swelling on left medial ankle with mild tenderness  Skin:  General: Skin is warm and dry.     Findings: Bruising (left medical ankle) present.  Neurological:     Mental Status: She is alert and oriented to person, place, and time.  Psychiatric:        Judgment: Judgment normal.     BP (!) 156/86 (BP Location: Right Arm, Patient Position: Sitting, Cuff Size: Small)   Pulse 60   Temp 98.3 F (36.8 C) (Oral)   Resp 16   Wt 200 lb (90.7 kg)   SpO2 98%   BMI 31.32 kg/m  Wt Readings from Last 3 Encounters:  07/21/21 200 lb (90.7 kg)  04/29/21 203 lb (92.1 kg)  12/06/20 195 lb (88.5 kg)       Assessment & Plan:   Problem List Items Addressed This Visit       Unprioritized   Acute left ankle pain - Primary    New.   Reviewed x-ray and x-ray reveals:  Nondisplaced avulsion fracture along the medial ankle.  Plan referral to sports medicine. See phone note.       Relevant Orders   DG Ankle Complete Left (Completed)     No orders of the defined types were placed in this encounter.   I, Lemont Fillers, NP, personally preformed the services described in this documentation.  All medical record entries made by the scribe were at my direction and in my presence.  I have reviewed the chart and discharge instructions (if applicable) and agree that the record reflects my personal performance and is accurate and complete. 07/21/2021   I,Melinda Lopez,acting as a Neurosurgeon for Lemont Fillers, NP.,have documented all relevant documentation on the behalf of Lemont Fillers, NP,as directed by  Lemont Fillers, NP while in the presence of Lemont Fillers, NP.   Lemont Fillers, NP

## 2021-07-22 ENCOUNTER — Ambulatory Visit (INDEPENDENT_AMBULATORY_CARE_PROVIDER_SITE_OTHER): Payer: BC Managed Care – PPO | Admitting: Family Medicine

## 2021-07-22 ENCOUNTER — Ambulatory Visit: Payer: Self-pay

## 2021-07-22 ENCOUNTER — Encounter: Payer: Self-pay | Admitting: Family Medicine

## 2021-07-22 ENCOUNTER — Ambulatory Visit (HOSPITAL_BASED_OUTPATIENT_CLINIC_OR_DEPARTMENT_OTHER)
Admission: RE | Admit: 2021-07-22 | Discharge: 2021-07-22 | Disposition: A | Discharge status: Home / Self Care | Payer: BC Managed Care – PPO | Source: Ambulatory Visit | Attending: Family Medicine | Admitting: Family Medicine

## 2021-07-22 VITALS — BP 140/82 | Ht 67.0 in | Wt 200.0 lb

## 2021-07-22 DIAGNOSIS — M79672 Pain in left foot: Secondary | ICD-10-CM | POA: Insufficient documentation

## 2021-07-22 DIAGNOSIS — S93422A Sprain of deltoid ligament of left ankle, initial encounter: Secondary | ICD-10-CM | POA: Diagnosis not present

## 2021-07-22 DIAGNOSIS — S8255XA Nondisplaced fracture of medial malleolus of left tibia, initial encounter for closed fracture: Secondary | ICD-10-CM | POA: Insufficient documentation

## 2021-07-22 NOTE — Progress Notes (Signed)
  Melinda Lopez - 61 y.o. female MRN [id number]  Date of birth: 21-Apr-1960  SUBJECTIVE:  Including CC & ROS.  No chief complaint on file.   Melinda Lopez is a 61 y.o. female that is presenting with acute left ankle pain.  She was walking at the [employer] a few days ago and noticed some severe pain in the medial aspect of the ankle the next day.  Having tenderness over the medial malleolus.  No specific inversion or eversion.  No history of similar pain.  She is on warfarin.  Independent review of the left ankle x-ray from 6/19 shows an avulsion fracture along the medial ankle.  Review of Systems See HPI   HISTORY: Past Medical, Surgical, Social, and Family History Reviewed & Updated per EMR.   Pertinent Historical Findings include:  Past Medical History:  Diagnosis Date   Carotid artery stenosis    Chronic headaches    Fatty liver 04/07/2016   Glaucoma    Hx of adenomatous polyp of colon 07/30/2011   Hx of blood clots    fingers   Hyperlipemia    Hypertension    Hypothyroidism    Migraine 1982   pt states migraines since then.    PFO (patent foramen ovale)    TIA (transient ischemic attack)     Past Surgical History:  Procedure Laterality Date   BREAST LUMPECTOMY Right 2000   CAROTID ARTERY - SUBCLAVIAN ARTERY BYPASS GRAFT Right 2006   COLONOSCOPY     UPPER GASTROINTESTINAL ENDOSCOPY       PHYSICAL EXAM:  VS: BP 140/82 (BP Location: Left Arm, Patient Position: Sitting)   Ht 5\' 7"  (1.702 m)   Wt 200 lb (90.7 kg)   BMI 31.32 kg/m  Physical Exam Gen: NAD, alert, cooperative with exam, well-appearing MSK:  Neurovascularly intact    Limited ultrasound: Left knee:  No cortex changes of the medial malleolus. There is a mild effusion around the posterior tibialis. Normal insertion into the navicular of the posterior tibialis. Calcification near the navicular appears to be chronic.  Summary: Mild effusion around posterior tibialis  Ultrasound and  interpretation by , MD    ASSESSMENT & PLAN:   Sprain of deltoid ligament of left ankle Acutely occurring.  No signs of fracture on independent review of the foot x-ray from today.  It seems that she may have a slight irritation of the deltoid ligament.  Ultrasound did reveal mild effusion within the posterior tibialis sheath so possible for retinacular partial tear. -Counseled on home exercise therapy and supportive care. -Green sport insoles with scaphoid pad. -Aircast. -X-ray foot. -Could consider physical therapy.

## 2021-07-22 NOTE — Patient Instructions (Signed)
Good to see you Please use ice as needed  Please try the aircast  Please use the insoles  Please try the exercises   Please send me a message in MyChart with any questions or updates.  Please see me back in 2-3 weeks.   --Dr. Jordan Likes

## 2021-07-22 NOTE — Assessment & Plan Note (Signed)
Acutely occurring.  No signs of fracture on independent review of the foot x-ray from today.  It seems that she may have a slight irritation of the deltoid ligament.  Ultrasound did reveal mild effusion within the posterior tibialis sheath so possible for retinacular partial tear. -Counseled on home exercise therapy and supportive care. -Green sport insoles with scaphoid pad. -Aircast. -X-ray foot. -Could consider physical therapy.

## 2021-07-24 ENCOUNTER — Encounter: Payer: Self-pay | Admitting: Family Medicine

## 2021-07-29 ENCOUNTER — Other Ambulatory Visit: Payer: Self-pay | Admitting: Family Medicine

## 2021-07-29 DIAGNOSIS — S8255XA Nondisplaced fracture of medial malleolus of left tibia, initial encounter for closed fracture: Secondary | ICD-10-CM

## 2021-07-29 NOTE — Progress Notes (Signed)
Patient is having worsening medial medialis pain with severe pain with weightbearing after her injury.  Concern for nondisplaced fracture given the amount of pain that she is experiencing with previously normal x-rays.  We will pursue an MRI to evaluate for nondisplaced fracture and presurgical planning.  Myra Rude, MD [facility] 07/29/2021, 8:36 AM

## 2021-07-31 ENCOUNTER — Ambulatory Visit (HOSPITAL_BASED_OUTPATIENT_CLINIC_OR_DEPARTMENT_OTHER)
Admission: RE | Admit: 2021-07-31 | Discharge: 2021-07-31 | Disposition: A | Discharge status: Home / Self Care | Payer: BC Managed Care – PPO | Source: Ambulatory Visit | Attending: Family Medicine | Admitting: Family Medicine

## 2021-07-31 DIAGNOSIS — X58XXXA Exposure to other specified factors, initial encounter: Secondary | ICD-10-CM | POA: Diagnosis not present

## 2021-07-31 DIAGNOSIS — S8255XA Nondisplaced fracture of medial malleolus of left tibia, initial encounter for closed fracture: Secondary | ICD-10-CM | POA: Diagnosis present

## 2021-08-01 ENCOUNTER — Ambulatory Visit
Admission: RE | Admit: 2021-08-01 | Discharge: 2021-08-01 | Disposition: A | Discharge status: Home / Self Care | Payer: BC Managed Care – PPO | Source: Ambulatory Visit | Attending: Family Medicine | Admitting: Family Medicine

## 2021-08-01 ENCOUNTER — Ambulatory Visit (INDEPENDENT_AMBULATORY_CARE_PROVIDER_SITE_OTHER): Payer: BC Managed Care – PPO | Admitting: Family Medicine

## 2021-08-01 ENCOUNTER — Encounter: Payer: Self-pay | Admitting: Family Medicine

## 2021-08-01 VITALS — BP 120/80 | Ht 67.0 in | Wt 200.0 lb

## 2021-08-01 DIAGNOSIS — E2839 Other primary ovarian failure: Secondary | ICD-10-CM | POA: Insufficient documentation

## 2021-08-01 DIAGNOSIS — S8255XA Nondisplaced fracture of medial malleolus of left tibia, initial encounter for closed fracture: Secondary | ICD-10-CM

## 2021-08-01 DIAGNOSIS — E559 Vitamin D deficiency, unspecified: Secondary | ICD-10-CM

## 2021-08-01 NOTE — Assessment & Plan Note (Signed)
Check bone density. 

## 2021-08-01 NOTE — Progress Notes (Signed)
  Melinda Lopez - 61 y.o. female MRN [id number]  Date of birth: [birth year]  SUBJECTIVE:  Including CC & ROS.  No chief complaint on file.   Melinda Lopez is a 61 y.o. female that is following up for her left ankle pain.  The MRI was obtained which did not show any specific fracture but showed a cyst and hematoma.  My independent review suggest a possible swelling in the medial malleolus.   Review of Systems See HPI   HISTORY: Past Medical, Surgical, Social, and Family History Reviewed & Updated per EMR.   Pertinent Historical Findings include:  Past Medical History:  Diagnosis Date   Carotid artery stenosis    Chronic headaches    Fatty liver 04/07/2016   Glaucoma    Hx of adenomatous polyp of colon 07/30/2011   Hx of blood clots    fingers   Hyperlipemia    Hypertension    Hypothyroidism    Migraine 1982   pt states migraines since then.    PFO (patent foramen ovale)    TIA (transient ischemic attack)     Past Surgical History:  Procedure Laterality Date   BREAST LUMPECTOMY Right 2000   CAROTID ARTERY - SUBCLAVIAN ARTERY BYPASS GRAFT Right 2006   COLONOSCOPY     UPPER GASTROINTESTINAL ENDOSCOPY       PHYSICAL EXAM:  VS: BP 120/80 (BP Location: Left Arm, Patient Position: Sitting)   Ht 5\' 7"  (1.702 m)   Wt 200 lb (90.7 kg)   BMI 31.32 kg/m  Physical Exam Gen: NAD, alert, cooperative with exam, well-appearing MSK:  Neurovascularly intact       ASSESSMENT & PLAN:   Nondisplaced fracture of medial malleolus of left tibia, initial encounter for closed fracture MRI completed shows swelling within the medial malleolus but this was read as normal.  She is having a cyst and hematoma in the surrounding area. -Counseled on home exercise therapy and supportive care. -Cam walker. - Could consider aspiration   Vitamin D deficiency Check vitamin D.  Estrogen deficiency Check bone density.

## 2021-08-01 NOTE — Patient Instructions (Signed)
Good to see you Please use the boot  I will call with the results from today   Please send me a message in MyChart with any questions or updates.  Please see me back before your trip.   --Dr. Jordan Likes

## 2021-08-01 NOTE — Assessment & Plan Note (Signed)
MRI completed shows swelling within the medial malleolus but this was read as normal.  She is having a cyst and hematoma in the surrounding area. -Counseled on home exercise therapy and supportive care. -Cam walker. - Could consider aspiration

## 2021-08-01 NOTE — Assessment & Plan Note (Signed)
Check vitamin D. 

## 2021-08-02 LAB — VITAMIN D 25 HYDROXY (VIT D DEFICIENCY, FRACTURES): Vit D, 25-Hydroxy: 26.8 ng/mL — ABNORMAL LOW (ref 30.0–100.0)

## 2021-08-06 ENCOUNTER — Telehealth: Payer: Self-pay | Admitting: Family Medicine

## 2021-08-06 MED ORDER — VITAMIN D (ERGOCALCIFEROL) 1.25 MG (50000 UNIT) PO CAPS: 50000.0000 [IU] | ORAL_CAPSULE | ORAL | 0 refills | Status: DC

## 2021-08-06 NOTE — Telephone Encounter (Signed)
Left VM for patient. If she calls back please have her speak with a nurse/CMA and inform that her bone density is showing osteopenia.  She will need another bone density in 2 years.  Her vitamin D is low and I will send in a prescription.   If any questions then please take the best time and phone number to call and I will try to call her back.   Myra Rude, MD [facility] 08/06/2021, 10:45 AM

## 2021-08-07 ENCOUNTER — Other Ambulatory Visit (HOSPITAL_BASED_OUTPATIENT_CLINIC_OR_DEPARTMENT_OTHER): Payer: BC Managed Care – PPO

## 2021-08-16 NOTE — Progress Notes (Deleted)
   ANNUAL EXAM Patient name: Melinda Lopez MRN [id number]  Date of birth: 01/22/1961 Chief Complaint:   No chief complaint on file.  History of Present Illness:   Melinda Lopez is a 61 y.o. [id number] female being seen today for a routine annual exam.   Current complaints: ***  No LMP recorded. Patient is postmenopausal.    Last pap 06/2017. Results were: NILM w/ HRHPV negative. H/O abnormal pap: no MXR wnl 05/20/21.   Review of Systems:   Pertinent items are noted in HPI Denies any headaches, blurred vision, fatigue, shortness of breath, chest pain, abdominal pain, abnormal vaginal discharge/itching/odor/irritation, problems with periods, bowel movements, urination, or intercourse unless otherwise stated above. *** Pertinent History Reviewed:  Reviewed past medical,surgical, social and family history.  Reviewed problem list, medications and allergies. Physical Assessment:  There were no vitals filed for this visit.There is no height or weight on file to calculate BMI.   Physical Examination:  General appearance - well appearing, and in no distress Mental status - alert, oriented to person, place, and time Psych:  She has a normal mood and affect Skin - warm and dry, normal color, no suspicious lesions noted Chest - effort normal, all lung fields clear to auscultation bilaterally Heart - normal rate and regular rhythm Neck:  midline trachea, no thyromegaly or nodules Breasts - breasts appear normal, no suspicious masses, no skin or nipple changes or axillary nodes Abdomen - soft, nontender, nondistended, no masses or organomegaly Pelvic -  VULVA: normal appearing vulva with no masses, tenderness or lesions  VAGINA: normal appearing vagina with normal color and discharge, no lesions   CERVIX: normal appearing cervix without discharge or lesions, no CMT UTERUS: uterus is felt to be normal size, shape, consistency and nontender  ADNEXA: No adnexal masses or tenderness  noted. Extremities:  No swelling or varicosities noted  Chaperone present for exam  No results found for this or any previous visit (from the past 24 hour(s)).  Assessment & Plan:  Diagnoses and all orders for this visit:  Encounter for annual routine gynecological examination  - Cervical cancer screening: Discussed guidelines. Pap with HPV done - Breast Health: Encouraged self breast awareness/SBE. Discussed limits of clinical breast exam for detecting breast cancer. Discussed importance of annual MXR. MXR up to date - Climacteric/Sexual health: Reviewed typical and atypical symptoms of menopause/peri-menopause. Discussed PMB and to call if any amount of spotting.  - Colonoscopy:  09/2017 - F/U 12 months and prn   No orders of the defined types were placed in this encounter.   Meds: No orders of the defined types were placed in this encounter.   Follow-up: No follow-ups on file.  Milas Hock, MD 08/16/2021 12:29 PM

## 2021-08-18 ENCOUNTER — Ambulatory Visit: Payer: BC Managed Care – PPO | Admitting: Obstetrics and Gynecology

## 2021-08-18 DIAGNOSIS — Z01419 Encounter for gynecological examination (general) (routine) without abnormal findings: Secondary | ICD-10-CM

## 2021-09-01 ENCOUNTER — Ambulatory Visit: Payer: BC Managed Care – PPO | Admitting: Neurology

## 2021-09-14 ENCOUNTER — Other Ambulatory Visit: Payer: Self-pay | Admitting: Family Medicine

## 2021-09-19 ENCOUNTER — Other Ambulatory Visit: Payer: Self-pay | Admitting: Family

## 2021-10-18 ENCOUNTER — Other Ambulatory Visit: Payer: Self-pay | Admitting: Family

## 2021-10-19 ENCOUNTER — Other Ambulatory Visit: Payer: Self-pay | Admitting: Family

## 2021-10-19 DIAGNOSIS — E039 Hypothyroidism, unspecified: Secondary | ICD-10-CM

## 2021-10-21 ENCOUNTER — Telehealth: Payer: Self-pay | Admitting: Family

## 2021-10-21 NOTE — Telephone Encounter (Signed)
Lvm to schedule

## 2021-10-21 NOTE — Telephone Encounter (Signed)
Please contact pt to schedule follow up.  

## 2021-11-16 ENCOUNTER — Telehealth: Payer: Self-pay | Admitting: Nurse Practitioner

## 2021-11-16 DIAGNOSIS — B9689 Other specified bacterial agents as the cause of diseases classified elsewhere: Secondary | ICD-10-CM

## 2021-11-16 DIAGNOSIS — J019 Acute sinusitis, unspecified: Secondary | ICD-10-CM

## 2021-11-16 MED ORDER — AMOXICILLIN-POT CLAVULANATE 875-125 MG PO TABS: 1.0000 | ORAL_TABLET | Freq: Two times a day (BID) | ORAL | 0 refills | Status: AC

## 2021-11-16 NOTE — Progress Notes (Signed)
I have spent 5 minutes in review of e-visit questionnaire, review and updating patient chart, medical decision making and response to patient.  ° °Carlisle Torgeson W Miryah Ralls, NP ° °  °

## 2021-11-16 NOTE — Progress Notes (Signed)

## 2021-12-10 ENCOUNTER — Ambulatory Visit (INDEPENDENT_AMBULATORY_CARE_PROVIDER_SITE_OTHER): Payer: Medicare Other | Admitting: Family

## 2021-12-10 VITALS — BP 134/79 | HR 59 | Temp 98.4°F | Resp 16 | Wt 199.0 lb

## 2021-12-10 DIAGNOSIS — I10 Essential (primary) hypertension: Secondary | ICD-10-CM

## 2021-12-10 DIAGNOSIS — E559 Vitamin D deficiency, unspecified: Secondary | ICD-10-CM

## 2021-12-10 DIAGNOSIS — G43909 Migraine, unspecified, not intractable, without status migrainosus: Secondary | ICD-10-CM | POA: Diagnosis not present

## 2021-12-10 DIAGNOSIS — D6862 Lupus anticoagulant syndrome: Secondary | ICD-10-CM

## 2021-12-10 DIAGNOSIS — E039 Hypothyroidism, unspecified: Secondary | ICD-10-CM

## 2021-12-10 DIAGNOSIS — E785 Hyperlipidemia, unspecified: Secondary | ICD-10-CM

## 2021-12-10 LAB — BASIC METABOLIC PANEL
BUN: 17 mg/dL (ref 6–23)
CO2: 29 mEq/L (ref 19–32)
Calcium: 9.7 mg/dL (ref 8.4–10.5)
Chloride: 104 mEq/L (ref 96–112)
Creatinine, Ser: 0.92 mg/dL (ref 0.40–1.20)
GFR: 67.44 mL/min (ref >60.00)
Glucose, Bld: 83 mg/dL (ref 70–99)
Potassium: 4.3 mEq/L (ref 3.5–5.1)
Sodium: 139 mEq/L (ref 135–145)

## 2021-12-10 LAB — VITAMIN D 25 HYDROXY (VIT D DEFICIENCY, FRACTURES): VITD: 32.57 ng/mL (ref 30.00–100.00)

## 2021-12-10 LAB — TSH: TSH: 0.72 u[IU]/mL (ref 0.35–5.50)

## 2021-12-10 NOTE — Assessment & Plan Note (Signed)
Continues chronic anticoagulation with coumadin which is followed at [facility].

## 2021-12-10 NOTE — Progress Notes (Signed)
Subjective:   By signing my name below, I, Melinda Lopez, attest that this documentation has been prepared under the direction and in the presence of Melinda Downs' Su[place]ivan, NP 12/10/2021     Patient ID: Melinda Lopez, female    DOB: 22-Oct-1960, 61 y.o.   MRN: [id number]  Chief Complaint  Patient presents with   Hypertension    Here for follow up    HPI Patient is in today for an office visit  Vitamin D: She is requesting for her Vitamin D levels checked. As of today's visit, she has finished her prescription of 50,000 units of Ergocalciferol.   Dexa: Last completed on 08/01/2021. Reports suggest that she had an extra bone in her ankle and mild bone thinning.   Blood Pressure: She is currently taking 5 mg of Amlodipine  BP Readings from Last 3 Encounters:  12/10/21 134/79  08/01/21 120/80  07/22/21 140/82   Pulse Readings from Last 3 Encounters:  12/10/21 (!) 59  07/21/21 60  04/29/21 71   Cumin: She reports that [facility] checks her Cumin levels.   Thyroid: She reports of normal fatigue which she contributes to older age. She is currently taking 100 mcg of Synthroid. Lab Results  Component Value Date   TSH 0.64 04/29/2021   Migraines: She reports that her migraines are controlled. She went through a period of three days where they were bad, then recovered. Symptoms reappeared but since then, has not had symptoms for the last couple of months. She was not able to schedule an appointment with the referred neurologist. She states that since symptoms are quiet, the referral was non urgent for her.   Cholesterol: Her cholesterol levels are normal.  Lab Results  Component Value Date   CHOL 153 04/29/2021   HDL 69.50 04/29/2021   LDLCALC 69 04/29/2021   TRIG 74.0 04/29/2021   CHOLHDL 2 04/29/2021   Immunizations: She reports that she is UTD on her influenza vaccine. She has not received the updated Covid vaccine. She is not interested in receiving the Shingles  vaccine during today's visit.   Pap Smear: Last completed on 06/15/2017. She reports that she has an upcoming appointment with her gynecologist   Health Maintenance Due  Topic Date Due   Medicare Annual Wellness (AWV)  Never done   Zoster Vaccines- Shingrix (1 of 2) Never done   COVID-19 Vaccine (4 - Pfizer series) 03/13/2020   PAP SMEAR-Modifier  06/15/2020    Past Medical History:  Diagnosis Date   Carotid artery stenosis    Chronic headaches    Fatty liver 04/07/2016   Glaucoma    Hx of adenomatous polyp of colon 07/30/2011   Hx of blood clots    fingers   Hyperlipemia    Hypertension    Hypothyroidism    Migraine 1982   pt states migraines since then.    PFO (patent foramen ovale)    TIA (transient ischemic attack)     Past Surgical History:  Procedure Laterality Date   BREAST LUMPECTOMY Right 2000   CAROTID ARTERY - SUBCLAVIAN ARTERY BYPASS GRAFT Right 2006   COLONOSCOPY     UPPER GASTROINTESTINAL ENDOSCOPY      Family History  Problem Relation Age of Onset   Heart disease Mother    Kidney disease Mother    Heart disease Father    Diabetes Sister    Kidney disease Sister    Heart disease Maternal Grandmother    Heart murmur Brother  Social History   Socioeconomic History   Marital status: Married    Spouse name: Not on file   Number of children: 2   Years of education: Not on file   Highest education level: Not on file  Occupational History   Not on file  Tobacco Use   Smoking status: Former    Types: Cigarettes    Quit date: 07/29/2002    Years since quitting: 19.3   Smokeless tobacco: Never  Vaping Use   Vaping Use: Never used  Substance and Sexual Activity   Alcohol use: Yes    Alcohol/week: 0.0 standard drinks of alcohol    Comment: 2 per day   Drug use: No   Sexual activity: Yes  Other Topics Concern   Not on file  Social History Narrative   Former smoker- quit in the 89's   Married    2 children: 1982- daughter Leslee Home- lives  in [place] Chrissie Noa- lives locally   2 grandchildren in [place]   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 20 tablet 0   amLODipine (NORVASC) 5 MG tablet TAKE 1 TABLET (5 MG TOTAL) BY MOUTH DAILY. 90 tablet 1   atorvastatin (LIPITOR) 20 MG tablet TAKE 1 TABLET BY MOUTH EVERY DAY 30 tablet 5   EPINEPHRINE 0.3 mg/0.3 mL IJ SOAJ injection Inject 0.3 mg into the muscle as needed for anaphylaxis. Use as needed for severe allergic reaction 2 each 2   erythromycin ophthalmic ointment Place 1 application. into both eyes 3 (three) times daily. 3.5 g 0   fluticasone (FLONASE) 50 MCG/ACT nasal spray PLACE 1 SPRAY INTO BOTH NOSTRILS 2 (TWO) TIMES DAILY 16 mL 6   influenza vac split quadrivalent PF (FLUARIX) 0.5 ML injection Inject into the muscle. 0.5 mL 0   levothyroxine (SYNTHROID) 100 MCG tablet TAKE 1 TABLET BY MOUTH EVERY DAY DUE 06/21/21 90 tablet 0   propranolol (INDERAL) 40 MG tablet 1 tablet by mouth 60-90 minutes prior to public speaking     SUMAtriptan (IMITREX) 50 MG tablet MAY REPEAT IN 2 HOURS IF HEADACHE PERSISTS OR RECURS. 10 tablet 2   triamcinolone cream (KENALOG) 0.1 % APPLY 1 APPLICATION. TOPICALLY 2 (TWO) TIMES DAILY. 30 g 0   warfarin (COUMADIN) 10 MG tablet Take 10 mg by mouth daily.     enoxaparin (LOVENOX) 150 MG/ML injection Take 130 mg Subcutaneously  as directed every 24 hours     Vitamin D, Ergocalciferol, (DRISDOL) 1.25 MG (50000 UNIT) CAPS capsule Take 1 capsule (50,000 Units total) by mouth every 7 (seven) days. Take for 8 total  doses(weeks) 8 capsule 0   Facility-Administered Medications Prior to Visit  Medication Dose Route Frequency Provider Last Rate Last Admin   0.9 %  sodium chloride infusion   Intravenous PRN Bhagat, Bhavinkumar, PA        Allergies  Allergen Reactions   Shellfish Allergy Anaphylaxis   Codeine Nausea Only   Morphine And Related Other (See Comments)    slurring words   Phenergan [Promethazine] Other (See Comments)  Causes blood presser to bottom out   Sulfa Antibiotics Rash    ROS    See HPI Objective:    Physical Exam Constitutional:      General: She is not in acute distress.    Appearance: Normal appearance. She is not ill-appearing.  HENT:     Head: Normocephalic and atraumatic.     Right Ear: External ear normal.     Left Ear: External ear normal.  Eyes:     Extraocular Movements: Extraocular movements intact.     Pupils: Pupils are equal, round, and reactive to light.  Cardiovascular:     Rate and Rhythm: Normal rate and regular rhythm.     Heart sounds: Normal heart sounds. No murmur heard.    No gallop.  Pulmonary:     Effort: Pulmonary effort is normal. No respiratory distress.     Breath sounds: Normal breath sounds. No wheezing or rales.  Skin:    General: Skin is warm and dry.  Neurological:     Mental Status: She is alert and oriented to person, place, and time.  Psychiatric:        Mood and Affect: Mood normal.        Behavior: Behavior normal.        Judgment: Judgment normal.     BP 134/79 (BP Location: Right Arm, Patient Position: Sitting, Cuff Size: Small)   Pulse (!) 59   Temp 98.4 F (36.9 C) (Oral)   Resp 16   Wt 199 lb (90.3 kg)   SpO2 96%   BMI 31.17 kg/m  Wt Readings from Last 3 Encounters:  12/10/21 199 lb (90.3 kg)  08/01/21 200 lb (90.7 kg)  07/22/21 200 lb (90.7 kg)       Assessment & Plan:   Problem List Items Addressed This Visit       Unprioritized   Vitamin D deficiency   Relevant Orders   VITAMIN D 25 Hydroxy  (Vit-D Deficiency, Fractures)   Migraine without status migrainosus, not intractable    Reports symptoms are stable.  Has imitrex on hand for prn use.        Lupus anticoagulant disorder (HCC)    Continues chronic anticoagulation with coumadin which is [facility].       Hypothyroidism    Lab Results  Component Value Date   TSH 0.64 04/29/2021  Stable on synthroid. Check TSH.      Relevant Orders   TSH   Hyperlipidemia    Lab Results  Component Value Date   CHOL 153 04/29/2021   HDL 69.50 04/29/2021   LDLCALC 69 04/29/2021   TRIG 74.0 04/29/2021   CHOLHDL 2 04/29/2021  Lipid panel stable on lipitor.  Continue same.       Essential hypertension - Primary    BP Readings from Last 3 Encounters:  12/10/21 134/79  08/01/21 120/80  07/22/21 140/82  BP stable. Continue amlodipine 5mg .        Relevant Orders   Basic metabolic panel   No orders of the defined types were placed in this encounter.   I, , NP, personally preformed the services described in this documentation.  All medical record entries made by the scribe were at my direction and in my presence.  I have reviewed the chart and discharge instructions (if applicable) and agree that the record reflects my personal performance and is accurate and complete. 12/10/2021   I,[name],acting as a scribe for 13/09/2021, NP.,have  documented all relevant documentation on the behalf of Lemont Fillers, NP,as directed by  Lemont Fillers, NP while in the presence of Lemont Fillers, NP.    Lemont Fillers, NP

## 2021-12-10 NOTE — Assessment & Plan Note (Signed)
Reports symptoms are stable.  Has imitrex on hand for prn use.

## 2021-12-10 NOTE — Assessment & Plan Note (Signed)
BP Readings from Last 3 Encounters:  12/10/21 134/79  08/01/21 120/80  07/22/21 140/82   BP stable. Continue amlodipine 5mg .

## 2021-12-10 NOTE — Assessment & Plan Note (Signed)
Lab Results  Component Value Date   CHOL 153 04/29/2021   HDL 69.50 04/29/2021   LDLCALC 69 04/29/2021   TRIG 74.0 04/29/2021   CHOLHDL 2 04/29/2021   Lipid panel stable on lipitor.  Continue same.

## 2021-12-10 NOTE — Assessment & Plan Note (Signed)
Lab Results  Component Value Date   TSH 0.64 04/29/2021   Stable on synthroid. Check TSH.

## 2021-12-31 ENCOUNTER — Other Ambulatory Visit: Payer: Self-pay | Admitting: Family

## 2022-01-23 ENCOUNTER — Encounter: Payer: Self-pay | Admitting: Family

## 2022-01-27 ENCOUNTER — Telehealth: Payer: Medicare Other | Admitting: Physician Assistant

## 2022-01-27 ENCOUNTER — Inpatient Hospital Stay: Admission: RE | Admit: 2022-01-27 | Payer: Medicare Other | Source: Ambulatory Visit

## 2022-01-27 DIAGNOSIS — Z20822 Contact with and (suspected) exposure to covid-19: Secondary | ICD-10-CM

## 2022-01-27 MED ORDER — DEXLANSOPRAZOLE 30 MG PO CPDR: 30.0000 mg | DELAYED_RELEASE_CAPSULE | Freq: Every day | ORAL | 1 refills | Status: DC

## 2022-01-27 NOTE — Progress Notes (Signed)
Because of your significant medical history and need to differentiate COVID, influenza and RSV to make sure we get you treated properly, I feel your condition warrants further evaluation and I recommend that you be seen in a face to face visit.   NOTE: There will be NO CHARGE for this eVisit   If you are having a true medical emergency please call 911.      For an urgent face to face visit, [facility] has seven urgent care centers for your convenience:     [facility] Center at [facility] Directions [phone] [address] [place], [place] [address]    [facility] Urgent Care Center [facility]) Get Driving Directions [phone] [address] [place], [place] [address]  [facility] Health Urgent Care Center [facility]) Get Driving Directions [phone] [address] [place],  [place]  [address]  [facility] Urgent Care Center [facility] - at [facility] Directions  [phone] [address] [place],  [place] [address]   [facility] Health Urgent Care at [facility] Get Driving Directions [phone] [address] [place], [place] [address]   [facility] Health Urgent Care at [facility] Get Driving Directions  [phone] [address] [place], [place] [address]   [facility] Health Urgent Care at [facility] Directions [phone] [address], [address] [place], [place] [address]  Your MyChart E-visit questionnaire answers were reviewed by a board certified advanced clinical practitioner to complete your personal care plan based on your specific symptoms.  Thank you for using e-Visits.

## 2022-01-28 ENCOUNTER — Encounter: Payer: Self-pay | Admitting: Family Medicine

## 2022-01-28 ENCOUNTER — Ambulatory Visit (INDEPENDENT_AMBULATORY_CARE_PROVIDER_SITE_OTHER): Payer: Medicare Other | Admitting: Family Medicine

## 2022-01-28 ENCOUNTER — Ambulatory Visit (INDEPENDENT_AMBULATORY_CARE_PROVIDER_SITE_OTHER)
Admission: RE | Admit: 2022-01-28 | Discharge: 2022-01-28 | Disposition: A | Discharge status: Home / Self Care | Payer: Medicare Other | Source: Ambulatory Visit | Attending: Family Medicine | Admitting: Family Medicine

## 2022-01-28 VITALS — BP 122/76 | HR 75 | Temp 98.7°F | Ht 67.0 in | Wt 197.4 lb

## 2022-01-28 DIAGNOSIS — R059 Cough, unspecified: Secondary | ICD-10-CM

## 2022-01-28 DIAGNOSIS — J111 Influenza due to unidentified influenza virus with other respiratory manifestations: Secondary | ICD-10-CM

## 2022-01-28 DIAGNOSIS — R509 Fever, unspecified: Secondary | ICD-10-CM

## 2022-01-28 LAB — POCT RESPIRATORY SYNCYTIAL VIRUS: RSV Rapid Ag: NEGATIVE

## 2022-01-28 LAB — POC COVID19 BINAXNOW: SARS Coronavirus 2 Ag: NEGATIVE

## 2022-01-28 MED ORDER — BENZONATATE 100 MG PO CAPS: 100.0000 mg | ORAL_CAPSULE | Freq: Three times a day (TID) | ORAL | 0 refills | Status: DC | PRN

## 2022-01-28 NOTE — Progress Notes (Signed)
Subjective:  Patient ID: Melinda Lopez, female    DOB: 10/24/1960  Age: 61 y.o. MRN: [id number]  CC:  Chief Complaint  Patient presents with   Nasal Congestion    Pt states she has been running a low grade fever, chills, non productive cough, fatigue, pt has been taking tylenol and is on warfarin, pt states she also has a headache     HPI Melinda Lopez presents for  Cough, fever, congestion Initial attempt at E-visit yesterday, based on concerns recommended in person visit.  Symptoms started 12/24 - fatigue, then fever on 12/25. Low grade fever -102 tmax. Cough , congestion, headache.  Dry cough. Fatigue persists. Body aches. Drinking fluids. No dyspnea. No chest pain. No confusion. HA, and fatigue worst parts. Decreased appetite.  No known sick contacts.  Tx tylenol every 4 hours.robitussin DM.  Neg home covid test yesterday.  Reaction to tamifu in past - dizzy, disoriented. Worse than being sick.   Flu vaccine in October, no recent covid booster. No RSV vaccine.  Immunization History  Administered Date(s) Administered   Influenza Inj Mdck Quad With Preservative 01/13/2017   Influenza,inj,Quad PF,6+ Mos 10/04/2015, 12/08/2017, 10/12/2018, 12/06/2020, 11/03/2021   Influenza-Unspecified 11/02/2013, 01/05/2017   PFIZER(Purple Top)SARS-COV-2 Vaccination 04/20/2019, 05/10/2019, 01/17/2020   Td 05/14/2010, 04/29/2021    History Patient Active Problem List   Diagnosis Date Noted   Estrogen deficiency 08/01/2021   Vitamin D deficiency 08/01/2021   Nondisplaced fracture of medial malleolus of left tibia, initial encounter for closed fracture 07/22/2021   Acute left ankle pain 07/21/2021   Glaucoma 04/29/2021   Low back pain 07/26/2020   Essential hypertension 07/26/2020   Arthritis of metatarsophalangeal (MTP) joint of great toe 07/20/2018   Lupus anticoagulant disorder (HCC) 06/16/2017   Carotid stenosis, asymptomatic, bilateral 09/24/2016   H/O carotid endarterectomy  08/26/2016   TIA (transient ischemic attack) 08/26/2016   Coagulation disorder (HCC) 08/26/2016   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   Greater trochanteric bursitis of right hip 02/28/2015   Patellofemoral syndrome 02/28/2015   Long term current use of anticoagulant therapy 02/25/2015   Hypercoagulable state (HCC) 02/25/2015   Fatty liver 05/29/2014   Preventative health care 04/12/2014   History of blood clots 03/15/2014   Patent foramen ovale 03/15/2014   History of carotid artery stenosis 03/15/2014   Migraine without status migrainosus, not intractable 03/15/2014   Hypothyroidism 03/15/2014   Hyperlipidemia 03/15/2014   History of cardiovascular disorder 03/15/2014   Hx of adenomatous polyp of colon 07/30/2011   Past Medical History:  Diagnosis Date   Carotid artery stenosis    Chronic headaches    Fatty liver 04/07/2016   Glaucoma    Hx of adenomatous polyp of colon 07/30/2011   Hx of blood clots    fingers   Hyperlipemia    Hypertension    Hypothyroidism    Migraine 1982   pt states migraines since then.    PFO (patent foramen ovale)    TIA (transient ischemic attack)    Past Surgical History:  Procedure Laterality Date   BREAST LUMPECTOMY Right 2000   CAROTID ARTERY - SUBCLAVIAN ARTERY BYPASS GRAFT Right 2006   COLONOSCOPY     UPPER GASTROINTESTINAL ENDOSCOPY     Allergies  Allergen Reactions   Shellfish Allergy Anaphylaxis   Codeine Nausea Only   Morphine And Related Other (See Comments)    slurring words   Phenergan [Promethazine] Other (See Comments)    Causes blood presser to bottom out  Sulfa Antibiotics Rash   Prior to Admission medications   Medication Sig Start Date End Date Taking? Authorizing Provider  amLODipine (NORVASC) 5 MG tablet TAKE 1 TABLET (5 MG TOTAL) BY MOUTH DAILY. 10/19/21  Yes [name], NP  atorvastatin (LIPITOR) 20 MG tablet TAKE 1 TABLET BY MOUTH EVERY DAY 10/19/21  Yes [name], NP  EPINEPHRINE 0.3 mg/0.3 mL IJ  SOAJ injection Inject 0.3 mg into the muscle as needed for anaphylaxis. Use as needed for severe allergic reaction 04/29/21  Yes [name], NP  erythromycin ophthalmic ointment Place 1 application. into both eyes 3 (three) times daily. 06/27/21  Yes [name], NP  fluticasone (FLONASE) 50 MCG/ACT nasal spray PLACE 1 SPRAY INTO BOTH NOSTRILS 2 (TWO) TIMES DAILY 04/27/21  Yes [name], NP  levothyroxine (SYNTHROID) 100 MCG tablet TAKE 1 TABLET BY MOUTH EVERY DAY DUE 06/21/21 12/31/21  Yes [name], NP  SUMAtriptan (IMITREX) 50 MG tablet MAY REPEAT IN 2 HOURS IF HEADACHE PERSISTS OR RECURS. 07/21/21  Yes [name], NP  triamcinolone cream (KENALOG) 0.1 % APPLY 1 APPLICATION. TOPICALLY 2 (TWO) TIMES DAILY. 05/29/21  Yes [name], NP  warfarin (COUMADIN) 10 MG tablet Take 10 mg by mouth daily.   Yes [provider]  ALPRAZolam Prudy Feeler) 0.5 MG tablet Take 1/2-1 tablet by mouth prior to flying. Patient not taking: Reported on 01/28/2022 07/26/20   [name], NP  Dexlansoprazole 30 MG capsule DR Take 1 capsule (30 mg total) by mouth daily. Patient not taking: Reported on 01/28/2022 01/27/22   [name], NP  influenza vac split quadrivalent PF (FLUARIX) 0.5 ML injection Inject into the muscle. Patient not taking: Reported on 01/28/2022 12/06/20   Judyann Munson, MD  propranolol (INDERAL) 40 MG tablet 1 tablet by mouth 60-90 minutes prior to public speaking Patient not taking: Reported on 01/28/2022 10/20/19   [name], NP   Social History   Socioeconomic History   Marital status: Married    Spouse name: Not on file   Number of children: 2   Years of education: Not on file   Highest education level: Not on file  Occupational History   Not on file  Tobacco Use   Smoking status: Former    Types: Cigarettes    Quit date: 07/29/2002    Years since quitting: 19.5   Smokeless tobacco: Never  Vaping Use    Vaping Use: Never used  Substance and Sexual Activity   Alcohol use: Yes    Alcohol/week: 0.0 standard drinks of alcohol    Comment: 2 per day   Drug use: No   Sexual activity: Yes  Other Topics Concern   Not on file  Social History Narrative   Former smoker- quit in the 44's   Married    2 children: 1982- daughter Leslee Home- lives in [place] Chrissie Noa- lives locally   2 grandchildren in [place]   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  Objective:   Vitals:   01/28/22 0852  BP: 122/76  Pulse: 75  Temp: 98.7 F (37.1 C)  SpO2: 94%  Weight: 197 lb 6.4 oz (89.5 kg)  Height: 5'  7" (1.702 m)    Physical Exam Vitals reviewed.  Constitutional:      General: She is not in acute distress.    Appearance: She is well-developed.  HENT:     Head: Normocephalic and atraumatic.     Right Ear: Hearing, tympanic membrane, ear canal and external ear normal.     Left Ear: Hearing, tympanic membrane, ear canal and external ear normal.     Nose: Nose normal.     Mouth/Throat:     Pharynx: No posterior oropharyngeal erythema.  Eyes:     Conjunctiva/sclera: Conjunctivae normal.     Pupils: Pupils are equal, round, and reactive to light.  Cardiovascular:     Rate and Rhythm: Normal rate and regular rhythm.     Heart sounds: Normal heart sounds. No murmur heard. Pulmonary:     Effort: Pulmonary effort is normal. No respiratory distress.     Breath sounds: No wheezing.     Comments: Few faint coarse bs RLL, nl effort, no distress.  Skin:    General: Skin is warm and dry.     Findings: No rash.  Neurological:     Mental Status: She is alert and oriented to person,  place, and time.  Psychiatric:        Mood and Affect: Mood normal.        Behavior: Behavior normal.    Results for orders placed or performed in visit on 01/28/22  POC COVID-19  Result Value Ref Range   SARS Coronavirus 2 Ag Negative Negative  POCT respiratory syncytial virus  Result Value Ref Range   RSV Rapid Ag neg       Assessment & Plan:  Melinda Lopez is a 61 y.o. female . Cough in adult - Plan: POC COVID-19, DG Chest 2 View, benzonatate (TESSALON) 100 MG capsule, POCT respiratory syncytial virus, CANCELED: POC Influenza A&B (Binax test)  Fever, unspecified fever cause - Plan: DG Chest 2 View  Influenza-like illness  Suspected viral illness, and possible influenza.  Unfortunately flu testing unavailable at this time due to backordered supply.  RSV, COVID testing negative.  Recommended repeat COVID test tomorrow and if positive call to discuss antivirals given her medical history.  Appears to be stable for outpatient treatment at this time.  Symptomatic care discussed with antitussives, Tessalon Perles if needed.  Fluids, rest, antipyretics for fever, headache, body aches.  Decided against Tamiflu given timing of initial symptoms and reactions to that medication in the past.  Chest x-ray ordered with few coarse breath sounds right lower lobe but possible atelectasis.  We discussed viral versus bacterial illness and rationale for no antibiotics at this time.  RTC/ER precautions given.  Meds ordered this encounter  Medications   benzonatate (TESSALON) 100 MG capsule    Sig: Take 1 capsule (100 mg total) by mouth 3 (three) times daily as needed for cough.    Dispense:  20 capsule    Refill:  0   Patient Instructions  Sorry you are sick.  I suspect influenza or influenza-like illness.  COVID test was again negative today as well as RSV testing.  I do recommend repeat COVID test tomorrow and if that is positive let me know right away and we can discuss antiviral medications.   I would not use Tamiflu at this time due to time since initial symptoms and your previous reactions.  Tessalon Perles if needed for cough, but can start with Mucinex DM or Robitussin DM.  Tylenol is  fine to use for headache or fever.  Make sure to drink plenty of fluids and rest.  Please have x-ray performed today although I think that will be normal.  If any concerns I will let you know.  I suspect you will be improving in the next 24 to 48 hours.  Hope you feel better soon!  Return to the clinic or go to the nearest emergency room if any of your symptoms worsen or new symptoms occur.  Viral Illness, Adult Viruses are tiny germs that can get into a person's body and cause illness. There are many different types of viruses, and they cause many types of illness. Viral illnesses can range from mild to severe. They can affect various parts of the body. Short-term conditions that are caused by a virus include colds and the flu (influenza). Long-term conditions that are caused by a virus include herpes, shingles, and HIV (human immunodeficiency virus) infection. A few viruses have been linked to certain cancers. What are the causes? Many types of viruses can cause illness. Viruses invade cells in your body, multiply, and cause the infected cells to work abnormally or die. When these cells die, they release more of the virus. When this happens, you develop symptoms of the illness, and the virus continues to spread to other cells. If the virus takes over the function of the cell, it can cause the cell to divide and grow out of control. This happens when a virus causes cancer. Different viruses get into the body in different ways. You can get a virus by: Swallowing food or water that has come in contact with the virus (is contaminated). Breathing in droplets that have been coughed or sneezed into the air by an infected person. Touching a surface that has been contaminated with the virus and then touching your  eyes, nose, or mouth. Being bitten by an insect or animal that carries the virus. Having sexual contact with a person who is infected with the virus. Being exposed to blood or fluids that contain the virus, either through an open cut or during a transfusion. If a virus enters your body, your body's defense system (immune system) will try to fight the virus. You may be at higher risk for a viral illness if your immune system is weak. What are the signs or symptoms? You may have these symptoms, depending on the type of virus and the location of the cells that it invades: Cold and flu viruses: Fever. Headache. Sore throat. Muscle aches. Stuffy nose (nasal congestion). Cough. Digestive system (gastrointestinal) viruses: Fever. Pain in the abdomen. Nausea. Diarrhea. Liver viruses (hepatitis): Loss of appetite. Tiredness. Skin or the white parts of your eyes turning yellow (jaundice). Brain and spinal cord viruses: Fever. Headache. Stiff neck. Nausea and vomiting. Confusion or sleepiness. Skin viruses: Warts. Itching. Rash. Sexually transmitted viruses: Discharge. Swelling. Redness. Rash. How is this diagnosed? This condition may be diagnosed based on one or more of the following: Symptoms. Medical history. Physical exam. Blood test, sample of mucus from your lungs (sputum sample), stool sample, or a swab of body fluids or a skin sore (lesion). How is this treated? Viruses can be hard to treat because they live within cells. Antibiotic medicines do not treat viruses because these medicines do not get inside cells. Treatment for a viral illness may include: Resting and drinking plenty of fluids. Medicines to relieve symptoms. These can include over-the-counter medicine for pain and fever, medicines for cough or congestion, and medicines to relieve  diarrhea. Antiviral medicines. These medicines are available only for certain types of viruses. Some viral illnesses can be  prevented with vaccinations. A common example is the flu shot. Follow these instructions at home: Medicines Take over-the-counter and prescription medicines only as told by your health care provider. If you were prescribed an antiviral medicine, take it as told by your health care provider. Do not stop taking the antiviral even if you start to feel better. Be aware of when antibiotics are needed and when they are not needed. Antibiotics do not treat viruses. You may get an antibiotic if your health care provider thinks that you may have, or are at risk for, a bacterial infection and you have a viral infection. Do not ask for an antibiotic prescription if you have been diagnosed with a viral illness. Antibiotics will not make your illness go away faster. Frequently taking antibiotics when they are not needed can lead to antibiotic resistance. When this develops, the medicine no longer works against the bacteria that it normally fights. General instructions  Drink enough fluids to keep your urine pale yellow. Rest as much as possible. Return to your normal activities as told by your health care provider. Ask your health care provider what activities are safe for you. Keep all follow-up visits as told by your health care provider. This is important. How is this prevented? To reduce your risk of viral illness: Wash your hands often with soap and water for at least 20 seconds. If soap and water are not available, use hand sanitizer. Avoid touching your nose, eyes, and mouth, especially if you have not washed your hands recently. If anyone in your household has a viral infection, clean all household surfaces that may have been in contact with the virus. Use soap and hot water. You may also use bleach that you have added water to (diluted). Stay away from people who are sick with symptoms of a viral infection. Do not share items such as toothbrushes and water bottles with other people. Keep your  vaccinations up to date. This includes getting a yearly flu shot. Eat a healthy diet and get plenty of rest. Contact a health care provider if: You have symptoms of a viral illness that do not go away. Your symptoms come back after going away. Your symptoms get worse. Get help right away if you have: Trouble breathing. A severe headache or a stiff neck. Severe vomiting or pain in your abdomen. These symptoms may represent a serious problem that is an emergency. Do not wait to see if the symptoms will go away. Get medical help right away. Call your local emergency services (911 in the [place] Do not drive yourself to the hospital. Summary Viruses are types of germs that can get into a person's body and cause illness. Viral illnesses can range from mild to severe. They can affect various parts of the body. Viruses can be hard to treat. There are medicines to relieve symptoms, and there are some antiviral medicines. If you were prescribed an antiviral medicine, take it as told by your health care provider. Do not stop taking the antiviral even if you start to feel better. Contact a health care provider if you have symptoms of a viral illness that do not go away. This information is not intended to replace advice given to you by your health care provider. Make sure you discuss any questions you have with your health care provider. Document Revised: 06/05/2019 Document Reviewed: 11/29/2018 Elsevier Patient Education  2023 [employer]     Signed,   Meredith Staggers, MD [facility], [facility] 01/28/22 9:38 AM

## 2022-01-28 NOTE — Patient Instructions (Signed)
Sorry you are sick.  I suspect influenza or influenza-like illness.  COVID test was again negative today as well as RSV testing.  I do recommend repeat COVID test tomorrow and if that is positive let me know right away and we can discuss antiviral medications.  I would not use Tamiflu at this time due to time since initial symptoms and your previous reactions.  Tessalon Perles if needed for cough, but can start with Mucinex DM or Robitussin DM.  Tylenol is fine to use for headache or fever.  Make sure to drink plenty of fluids and rest.  Please have x-ray performed today although I think that will be normal.  If any concerns I will let you know.  I suspect you will be improving in the next 24 to 48 hours.  Hope you feel better soon!  Return to the clinic or go to the nearest emergency room if any of your symptoms worsen or new symptoms occur.  Viral Illness, Adult Viruses are tiny germs that can get into a person's body and cause illness. There are many different types of viruses, and they cause many types of illness. Viral illnesses can range from mild to severe. They can affect various parts of the body. Short-term conditions that are caused by a virus include colds and the flu (influenza). Long-term conditions that are caused by a virus include herpes, shingles, and HIV (human immunodeficiency virus) infection. A few viruses have been linked to certain cancers. What are the causes? Many types of viruses can cause illness. Viruses invade cells in your body, multiply, and cause the infected cells to work abnormally or die. When these cells die, they release more of the virus. When this happens, you develop symptoms of the illness, and the virus continues to spread to other cells. If the virus takes over the function of the cell, it can cause the cell to divide and grow out of control. This happens when a virus causes cancer. Different viruses get into the body in different ways. You can get a virus  by: Swallowing food or water that has come in contact with the virus (is contaminated). Breathing in droplets that have been coughed or sneezed into the air by an infected person. Touching a surface that has been contaminated with the virus and then touching your eyes, nose, or mouth. Being bitten by an insect or animal that carries the virus. Having sexual contact with a person who is infected with the virus. Being exposed to blood or fluids that contain the virus, either through an open cut or during a transfusion. If a virus enters your body, your body's defense system (immune system) will try to fight the virus. You may be at higher risk for a viral illness if your immune system is weak. What are the signs or symptoms? You may have these symptoms, depending on the type of virus and the location of the cells that it invades: Cold and flu viruses: Fever. Headache. Sore throat. Muscle aches. Stuffy nose (nasal congestion). Cough. Digestive system (gastrointestinal) viruses: Fever. Pain in the abdomen. Nausea. Diarrhea. Liver viruses (hepatitis): Loss of appetite. Tiredness. Skin or the white parts of your eyes turning yellow (jaundice). Brain and spinal cord viruses: Fever. Headache. Stiff neck. Nausea and vomiting. Confusion or sleepiness. Skin viruses: Warts. Itching. Rash. Sexually transmitted viruses: Discharge. Swelling. Redness. Rash. How is this diagnosed? This condition may be diagnosed based on one or more of the following: Symptoms. Medical history. Physical exam. Blood test,  sample of mucus from your lungs (sputum sample), stool sample, or a swab of body fluids or a skin sore (lesion). How is this treated? Viruses can be hard to treat because they live within cells. Antibiotic medicines do not treat viruses because these medicines do not get inside cells. Treatment for a viral illness may include: Resting and drinking plenty of fluids. Medicines to  relieve symptoms. These can include over-the-counter medicine for pain and fever, medicines for cough or congestion, and medicines to relieve diarrhea. Antiviral medicines. These medicines are available only for certain types of viruses. Some viral illnesses can be prevented with vaccinations. A common example is the flu shot. Follow these instructions at home: Medicines Take over-the-counter and prescription medicines only as told by your health care provider. If you were prescribed an antiviral medicine, take it as told by your health care provider. Do not stop taking the antiviral even if you start to feel better. Be aware of when antibiotics are needed and when they are not needed. Antibiotics do not treat viruses. You may get an antibiotic if your health care provider thinks that you may have, or are at risk for, a bacterial infection and you have a viral infection. Do not ask for an antibiotic prescription if you have been diagnosed with a viral illness. Antibiotics will not make your illness go away faster. Frequently taking antibiotics when they are not needed can lead to antibiotic resistance. When this develops, the medicine no longer works against the bacteria that it normally fights. General instructions  Drink enough fluids to keep your urine pale yellow. Rest as much as possible. Return to your normal activities as told by your health care provider. Ask your health care provider what activities are safe for you. Keep all follow-up visits as told by your health care provider. This is important. How is this prevented? To reduce your risk of viral illness: Wash your hands often with soap and water for at least 20 seconds. If soap and water are not available, use hand sanitizer. Avoid touching your nose, eyes, and mouth, especially if you have not washed your hands recently. If anyone in your household has a viral infection, clean all household surfaces that may have been in contact  with the virus. Use soap and hot water. You may also use bleach that you have added water to (diluted). Stay away from people who are sick with symptoms of a viral infection. Do not share items such as toothbrushes and water bottles with other people. Keep your vaccinations up to date. This includes getting a yearly flu shot. Eat a healthy diet and get plenty of rest. Contact a health care provider if: You have symptoms of a viral illness that do not go away. Your symptoms come back after going away. Your symptoms get worse. Get help right away if you have: Trouble breathing. A severe headache or a stiff neck. Severe vomiting or pain in your abdomen. These symptoms may represent a serious problem that is an emergency. Do not wait to see if the symptoms will go away. Get medical help right away. Call your local emergency services (911 in the [place] Do not drive yourself to the hospital. Summary Viruses are types of germs that can get into a person's body and cause illness. Viral illnesses can range from mild to severe. They can affect various parts of the body. Viruses can be hard to treat. There are medicines to relieve symptoms, and there are some antiviral medicines. If  you were prescribed an antiviral medicine, take it as told by your health care provider. Do not stop taking the antiviral even if you start to feel better. Contact a health care provider if you have symptoms of a viral illness that do not go away. This information is not intended to replace advice given to you by your health care provider. Make sure you discuss any questions you have with your health care provider. Document Revised: 06/05/2019 Document Reviewed: 11/29/2018 Elsevier Patient Education  2023 [employer].

## 2022-02-03 ENCOUNTER — Ambulatory Visit: Payer: Medicare Other | Admitting: Family

## 2022-02-06 ENCOUNTER — Other Ambulatory Visit: Payer: Self-pay | Admitting: Family

## 2022-02-06 DIAGNOSIS — E039 Hypothyroidism, unspecified: Secondary | ICD-10-CM

## 2022-02-15 ENCOUNTER — Other Ambulatory Visit: Payer: Self-pay | Admitting: Family

## 2022-02-15 MED ORDER — OMEPRAZOLE 40 MG PO CPDR: 40.0000 mg | DELAYED_RELEASE_CAPSULE | Freq: Every day | ORAL | 1 refills | Status: DC

## 2022-03-15 ENCOUNTER — Other Ambulatory Visit: Payer: Self-pay | Admitting: Family Medicine

## 2022-03-27 ENCOUNTER — Encounter: Payer: Self-pay | Admitting: Family

## 2022-03-27 DIAGNOSIS — E785 Hyperlipidemia, unspecified: Secondary | ICD-10-CM

## 2022-03-27 DIAGNOSIS — D6859 Other primary thrombophilia: Secondary | ICD-10-CM

## 2022-03-27 DIAGNOSIS — Z8679 Personal history of other diseases of the circulatory system: Secondary | ICD-10-CM

## 2022-03-27 DIAGNOSIS — D6862 Lupus anticoagulant syndrome: Secondary | ICD-10-CM

## 2022-03-30 ENCOUNTER — Other Ambulatory Visit: Payer: Self-pay | Admitting: Family

## 2022-03-30 DIAGNOSIS — Z1231 Encounter for screening mammogram for malignant neoplasm of breast: Secondary | ICD-10-CM

## 2022-04-02 ENCOUNTER — Telehealth: Payer: Self-pay | Admitting: *Deleted

## 2022-04-02 NOTE — Telephone Encounter (Signed)
Left patient a message to call and schedule appointment. Patient disconnected call when office was coming back on the line to schedule appointment after hold of looking for available date.

## 2022-04-08 ENCOUNTER — Other Ambulatory Visit: Payer: Self-pay

## 2022-04-08 DIAGNOSIS — I6523 Occlusion and stenosis of bilateral carotid arteries: Secondary | ICD-10-CM

## 2022-04-08 DIAGNOSIS — Z9889 Other specified postprocedural states: Secondary | ICD-10-CM

## 2022-04-17 NOTE — Progress Notes (Unsigned)
Last Mammogram: 05/20/21- negative Last Pap Smear:  06/15/17- negative Last Colon Screening;  *** Seat Belts:   *** Sun Screen:   *** Dental Check Up:  *** Brush & Floss:  ***

## 2022-04-20 ENCOUNTER — Other Ambulatory Visit (HOSPITAL_COMMUNITY)
Admission: RE | Admit: 2022-04-20 | Discharge: 2022-04-20 | Disposition: A | Discharge status: Home / Self Care | Payer: BC Managed Care – PPO | Source: Ambulatory Visit | Attending: Obstetrics & Gynecology | Admitting: Obstetrics & Gynecology

## 2022-04-20 ENCOUNTER — Ambulatory Visit (INDEPENDENT_AMBULATORY_CARE_PROVIDER_SITE_OTHER): Payer: BC Managed Care – PPO | Admitting: Surgery

## 2022-04-20 ENCOUNTER — Ambulatory Visit (HOSPITAL_COMMUNITY)
Admission: RE | Admit: 2022-04-20 | Discharge: 2022-04-20 | Disposition: A | Discharge status: Home / Self Care | Payer: BC Managed Care – PPO | Source: Ambulatory Visit | Attending: Surgery | Admitting: Surgery

## 2022-04-20 ENCOUNTER — Encounter: Payer: Self-pay | Admitting: Surgery

## 2022-04-20 ENCOUNTER — Ambulatory Visit (INDEPENDENT_AMBULATORY_CARE_PROVIDER_SITE_OTHER): Payer: BC Managed Care – PPO | Admitting: Obstetrics & Gynecology

## 2022-04-20 ENCOUNTER — Encounter: Payer: Self-pay | Admitting: Obstetrics & Gynecology

## 2022-04-20 VITALS — BP 132/75 | HR 57 | Ht 67.0 in | Wt 199.0 lb

## 2022-04-20 VITALS — BP 134/81 | HR 62 | Temp 97.9°F | Resp 20 | Ht 67.0 in | Wt 198.8 lb

## 2022-04-20 DIAGNOSIS — Z01419 Encounter for gynecological examination (general) (routine) without abnormal findings: Secondary | ICD-10-CM | POA: Insufficient documentation

## 2022-04-20 DIAGNOSIS — I6523 Occlusion and stenosis of bilateral carotid arteries: Secondary | ICD-10-CM | POA: Insufficient documentation

## 2022-04-20 DIAGNOSIS — N814 Uterovaginal prolapse, unspecified: Secondary | ICD-10-CM | POA: Diagnosis not present

## 2022-04-20 DIAGNOSIS — M7989 Other specified soft tissue disorders: Secondary | ICD-10-CM

## 2022-04-20 DIAGNOSIS — Z9889 Other specified postprocedural states: Secondary | ICD-10-CM | POA: Insufficient documentation

## 2022-04-20 NOTE — Progress Notes (Signed)
[facility]  Patient name: Melinda Lopez MRN: [id number] DOB: 1960/08/13 Sex: female   REQUESTING PROVIDER:    Debbrah Alar   REASON FOR CONSULT:    Carotid disease  HISTORY OF PRESENT ILLNESS:   Melinda Lopez is a 62 y.o. female, who is referred for follow-up of her carotid disease.  At age 69, the patient underwent right carotid endarterectomy by Dr. Levi Aland in [facility].  This was for asymptomatic greater than 80% stenosis.  The patient reports being diagnosed with a TIA in her 72s around the time of childbirth.  She had migraine headaches and then had some dysarthria.  She has been diagnosed with antiphospholipid syndrome.  She did have episodes of Raynaud's disease.  She was also found to have a PFO and so she was started on Coumadin and has remained on Coumadin for many years.  She is followed by hematology.  They have sent off blood work to see if she could be switched to a DOAC, but reportedly she cannot.  She has also been found to be pre-lupus.  She is a former smoker.  She takes a statin for hypercholesterolemia.  She is medically managed for hypertension.  She does complain of some leg swelling particularly when she is on her feet for long time.  She denies any symptoms of claudication.  PAST MEDICAL HISTORY    Past Medical History:  Diagnosis Date   Carotid artery stenosis    Chronic headaches    Fatty liver 04/07/2016   Glaucoma    Hx of adenomatous polyp of colon 07/30/2011   Hx of blood clots    fingers   Hyperlipemia    Hypertension    Hypothyroidism    Migraine 1982   pt states migraines since then.    PFO (patent foramen ovale)    TIA (transient ischemic attack)      FAMILY HISTORY   Family History  Problem Relation Age of Onset   Heart disease Mother    Kidney disease Mother    Heart disease Father    Diabetes Sister    Kidney disease Sister    Heart disease  Maternal Grandmother    Heart murmur Brother     SOCIAL HISTORY:   Social History   Socioeconomic History   Marital status: Married    Spouse name: Not on file   Number of children: 2   Years of education: Not on file   Highest education level: Not on file  Occupational History   Not on file  Tobacco Use   Smoking status: Former    Types: Cigarettes    Quit date: 07/29/2002    Years since quitting: 19.7   Smokeless tobacco: Never  Vaping Use   Vaping Use: Never used  Substance and Sexual Activity   Alcohol use: Yes    Alcohol/week: 0.0 standard drinks of alcohol    Comment: 2 per day   Drug use: No   Sexual activity: Yes  Other Topics Concern   Not on file  Social History Narrative   Former smoker- quit in the 9's   Married    2 children: 1982- daughter Venetia Night- lives in [place]- lives locally   2 grandchildren in [place]   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   Social Determinants of Radio broadcast assistant Strain: Not on [employer] Insecurity: Not on file  Transportation Needs: Not on file  Physical  Activity: Not on file  Stress: Not on file  Social Connections: Not on file  Intimate Partner Violence: Not on file    ALLERGIES:    Allergies  Allergen Reactions   Shellfish Allergy Anaphylaxis   Codeine Nausea Only   Morphine Other (See Comments)    Other reaction(s): Other (See Comments), slurring words, slurring words   Morphine And Related Other (See Comments)    slurring words   Phenergan [Promethazine] Other (See Comments)    Causes blood presser to bottom out   Sulfa Antibiotics Rash and Nausea And Vomiting    Nausea    CURRENT MEDICATIONS:    Current Outpatient Medications  Medication Sig Dispense Refill   ALPRAZolam (XANAX) 0.5 MG tablet Take 1/2-1 tablet by mouth prior to flying. 20 tablet 0   amLODipine (NORVASC) 5 MG tablet TAKE 1 TABLET (5 MG TOTAL) BY MOUTH DAILY. 90 tablet 1   atorvastatin  (LIPITOR) 20 MG tablet TAKE 1 TABLET BY MOUTH EVERY DAY 90 tablet 1   EPINEPHRINE 0.3 mg/0.3 mL IJ SOAJ injection Inject 0.3 mg into the muscle as needed for anaphylaxis. Use as needed for severe allergic reaction 2 each 2   erythromycin ophthalmic ointment Place 1 application. into both eyes 3 (three) times daily. 3.5 g 0   fluticasone (FLONASE) 50 MCG/ACT nasal spray PLACE 1 SPRAY INTO BOTH NOSTRILS 2 (TWO) TIMES DAILY 16 mL 6   influenza vac split quadrivalent PF (FLUARIX) 0.5 ML injection Inject into the muscle. 0.5 mL 0   levothyroxine (SYNTHROID) 100 MCG tablet TAKE 1 TABLET BY MOUTH EVERY DAY DUE 06/21/21 90 tablet 1   omeprazole (PRILOSEC) 40 MG capsule Take 1 capsule (40 mg total) by mouth daily. 90 capsule 1   SUMAtriptan (IMITREX) 50 MG tablet MAY REPEAT IN 2 HOURS IF HEADACHE PERSISTS OR RECURS. 10 tablet 2   triamcinolone cream (KENALOG) 0.1 % APPLY 1 APPLICATION. TOPICALLY 2 (TWO) TIMES DAILY. 30 g 0   warfarin (COUMADIN) 10 MG tablet Take 10 mg by mouth daily.     Current Facility-Administered Medications  Medication Dose Route Frequency Provider Last Rate Last Admin   0.9 %  sodium chloride infusion   Intravenous PRN Bhagat, Bhavinkumar, PA        REVIEW OF SYSTEMS:   [X]  denotes positive finding, [ ]  denotes negative finding Cardiac  Comments:  Chest pain or chest pressure:    Shortness of breath upon exertion:    Short of breath when lying flat:    Irregular heart rhythm:        Vascular    Pain in calf, thigh, or hip brought on by ambulation:    Pain in feet at night that wakes you up from your sleep:     Blood clot in your veins:    Leg swelling:  x       Pulmonary    Oxygen at home:    Productive cough:     Wheezing:         Neurologic    Sudden weakness in arms or legs:     Sudden numbness in arms or legs:     Sudden onset of difficulty speaking or slurred speech:    Temporary loss of vision in one eye:     Problems with dizziness:          Gastrointestinal    Blood in stool:      Vomited blood:         Genitourinary  Burning when urinating:     Blood in urine:        Psychiatric    Major depression:         Hematologic    Bleeding problems:    Problems with blood clotting too easily:        Skin    Rashes or ulcers:        Constitutional    Fever or chills:     PHYSICAL EXAM:   Vitals:   04/20/22 1507 04/20/22 1509  BP: (!) 140/84 134/81  Pulse: 62   Resp: 20   Temp: 97.9 F (36.6 C)   SpO2: 95%   Weight: 198 lb 12.8 oz (90.2 kg)   Height: 5\' 7"  (1.702 m)     GENERAL: The patient is a well-nourished female, in no acute distress. The vital signs are documented above. CARDIAC: There is a regular rate and rhythm.  VASCULAR: Palpable posterior tibial pulses bilaterally PULMONARY: Nonlabored respirations ABDOMEN: Soft and non-tender with normal pitched bowel sounds.  MUSCULOSKELETAL: There are no major deformities or cyanosis. NEUROLOGIC: No focal weakness or paresthesias are detected. SKIN: There are no ulcers or rashes noted. PSYCHIATRIC: The patient has a normal affect.  STUDIES:   I have reviewed the following:  Right Carotid: The extracranial vessels were near-normal with only minimal  wall                thickening or plaque.   Left Carotid: The extracranial vessels were near-normal with only minimal  wall               thickening or plaque.   Vertebrals: Bilateral vertebral arteries demonstrate antegrade flow.    ASSESSMENT and PLAN   Carotid: No evidence of carotid disease bilaterally.  I will continue with surveillance given that she has had a prior right carotid endarterectomy.  Her next study will be in 2 years  Leg swelling: She has been wearing compression socks for a long time.  I told her to make sure that she is at 20-30 and to try wearing thigh-high compression socks.  I will bring her back in 2-4 weeks for a venous reflux study and to see the PA.  She has met her  deductible for the year which ends on July 1, so if she does have correctable axial reflux, I would like for her to be scheduled to see me in May.  If her leg swelling is not secondary to superficial venous insufficiency, she will just continue to wear her compression socks and follow-up with me for her carotid disease in 2 years.   Leia Alf, MD, FACS [facility] [phone] Pager [phone]

## 2022-04-20 NOTE — Addendum Note (Signed)
Addended by: Lyndal Rainbow on: 04/20/2022 10:37 AM   Modules accepted: Orders

## 2022-04-20 NOTE — Progress Notes (Signed)
Subjective:     Melinda Lopez is a 62 y.o. female here for a routine exam.  Current complaints: interested in restarting PT and pessary.     Gynecologic History No LMP recorded. Patient is postmenopausal. Contraception: post menopausal status Last Mammogram: 05/20/21- negative Last Pap Smear:  06/15/17- negative Pt has this scheduled    Last Colon Screening;  Done 2022- normal Seat Belts:   yes Sun Screen:   yes Dental Check Up: yes Brush & Floss:  yes    Obstetric History OB History  Gravida Para Term Preterm AB Living  2 2 2         SAB IAB Ectopic Multiple Live Births          2    # Outcome Date GA Lbr Len/2nd Weight Sex Delivery Anes PTL Lv  2 Term    3.345 kg       1 Term    4.054 kg          The following portions of the patient's history were reviewed and updated as appropriate: allergies, current medications, past family history, past medical history, past social history, past surgical history, and problem list.  Review of Systems Pertinent items noted in HPI and remainder of comprehensive ROS otherwise negative.    Objective:     Vitals:   04/20/22 0927  BP: 132/75  Pulse: (!) 57  Weight: 90.3 kg  Height: 5\' 7"  (1.702 m)   Vitals:  WNL General appearance: alert, cooperative and no distress  HEENT: Normocephalic, without obvious abnormality, atraumatic Eyes: negative Throat: lips, mucosa, and tongue normal; teeth and gums normal  Respiratory: Clear to auscultation bilaterally  CV: Regular rate and rhythm  Breasts:  Normal appearance, no masses or tenderness, no nipple retraction or dimpling  GI: Soft, non-tender; bowel sounds normal; no masses,  no organomegaly  GU: External Genitalia:  Tanner V, no lesion Urethra:  No prolapse   Vagina: Pink, normal rugae, no blood or discharge  Cervix: No CMT, no lesion  Uterus:  Normal size and contour, non tender; moderate prolapse  Adnexa: Normal, no masses, non tender  Musculoskeletal: No edema, redness or  tenderness in the calves or thighs  Skin: No lesions or rash  Lymphatic: Axillary adenopathy: none     Psychiatric: Normal mood and behavior        Assessment:    Healthy female exam.    Plan:    Pap with co testing Yearly mamograms PT referral for prolapse F/U with PCP for other health maint. RTC 3 months for pessary discussion and fitting.

## 2022-04-21 ENCOUNTER — Other Ambulatory Visit: Payer: Self-pay

## 2022-04-21 DIAGNOSIS — M7989 Other specified soft tissue disorders: Secondary | ICD-10-CM

## 2022-04-22 LAB — CYTOLOGY - PAP
Comment: NEGATIVE
Diagnosis: NEGATIVE
High risk HPV: NEGATIVE

## 2022-04-24 ENCOUNTER — Encounter: Payer: Self-pay | Admitting: Cardiovascular Disease

## 2022-04-24 ENCOUNTER — Ambulatory Visit: Payer: BC Managed Care – PPO | Attending: Cardiovascular Disease | Admitting: Cardiovascular Disease

## 2022-04-24 ENCOUNTER — Telehealth: Payer: Self-pay | Admitting: *Deleted

## 2022-04-24 VITALS — BP 126/74 | HR 57 | Ht 67.0 in | Wt 199.0 lb

## 2022-04-24 DIAGNOSIS — D6859 Other primary thrombophilia: Secondary | ICD-10-CM

## 2022-04-24 DIAGNOSIS — R0602 Shortness of breath: Secondary | ICD-10-CM | POA: Diagnosis not present

## 2022-04-24 DIAGNOSIS — I10 Essential (primary) hypertension: Secondary | ICD-10-CM | POA: Diagnosis not present

## 2022-04-24 NOTE — Progress Notes (Signed)
Chief Complaint  Patient presents with   New Patient (Initial Visit)    Hypercoagulable state   History of Present Illness: 62 yo female with history of carotid artery disease, HTN, hyperlipidemia, hypothyroidism, possible PFO, prior TIA and hypercoagulable state (antiphospholipid antibody syndrome) who is here today as a new patient to establish cardiology care. She underwent right carotid endarterectomy at age 59. She had a TIA in her 40s. She has antiphospholipid syndrome. She was told that she had a PFO many years ago and has been on coumadin chronically. She had been followed at [facility] and recently wanted to change her care to [facility]. She has seen Dr. Trula Slade in [place].   She tells me today that she has fatigue and dyspnea. No chest pain. She has some LE edema but this resolves at night.   Primary Care Physician: Debbrah Alar, NP   Past Medical History:  Diagnosis Date   Carotid artery stenosis    Chronic headaches    Fatty liver 04/07/2016   Glaucoma    Hx of adenomatous polyp of colon 07/30/2011   Hx of blood clots    fingers   Hyperlipemia    Hypertension    Hypothyroidism    Migraine 1982   pt states migraines since then.    PFO (patent foramen ovale)    TIA (transient ischemic attack)     Past Surgical History:  Procedure Laterality Date   BREAST LUMPECTOMY Right 2000   CAROTID ARTERY - SUBCLAVIAN ARTERY BYPASS GRAFT Right 2006   COLONOSCOPY     UPPER GASTROINTESTINAL ENDOSCOPY      Current Outpatient Medications  Medication Sig Dispense Refill   ALPRAZolam (XANAX) 0.5 MG tablet Take 1/2-1 tablet by mouth prior to flying. 20 tablet 0   amLODipine (NORVASC) 5 MG tablet TAKE 1 TABLET (5 MG TOTAL) BY MOUTH DAILY. 90 tablet 1   atorvastatin (LIPITOR) 20 MG tablet TAKE 1 TABLET BY MOUTH EVERY DAY 90 tablet 1   EPINEPHRINE 0.3 mg/0.3 mL IJ SOAJ injection Inject 0.3 mg into the muscle as needed for anaphylaxis. Use as needed for severe allergic  reaction 2 each 2   erythromycin ophthalmic ointment Place 1 application. into both eyes 3 (three) times daily. 3.5 g 0   fluticasone (FLONASE) 50 MCG/ACT nasal spray PLACE 1 SPRAY INTO BOTH NOSTRILS 2 (TWO) TIMES DAILY 16 mL 6   influenza vac split quadrivalent PF (FLUARIX) 0.5 ML injection Inject into the muscle. 0.5 mL 0   levothyroxine (SYNTHROID) 100 MCG tablet TAKE 1 TABLET BY MOUTH EVERY DAY DUE 06/21/21 90 tablet 1   omeprazole (PRILOSEC) 40 MG capsule Take 1 capsule (40 mg total) by mouth daily. 90 capsule 1   SUMAtriptan (IMITREX) 50 MG tablet MAY REPEAT IN 2 HOURS IF HEADACHE PERSISTS OR RECURS. 10 tablet 2   triamcinolone cream (KENALOG) 0.1 % APPLY 1 APPLICATION. TOPICALLY 2 (TWO) TIMES DAILY. 30 g 0   warfarin (COUMADIN) 10 MG tablet Take 10 mg by mouth daily.     Current Facility-Administered Medications  Medication Dose Route Frequency Provider Last Rate Last Admin   0.9 %  sodium chloride infusion   Intravenous PRN Bhagat, Bhavinkumar, PA        Allergies  Allergen Reactions   Shellfish Allergy Anaphylaxis   Codeine Nausea Only   Morphine Other (See Comments)    Other reaction(s): Other (See Comments), slurring words, slurring words   Morphine And Related Other (See Comments)    slurring  words   Phenergan [Promethazine] Other (See Comments)    Causes blood presser to bottom out   Sulfa Antibiotics Rash and Nausea And Vomiting    Nausea    Social History   Socioeconomic History   Marital status: Married    Spouse name: Not on file   Number of children: 2   Years of education: Not on file   Highest education level: Not on file  Occupational History   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  Tobacco Use   Smoking status: Former    Types: Cigarettes    Quit date: 07/29/2002    Years since quitting: 19.7   Smokeless tobacco: Never  Vaping Use   Vaping Use: Never used  Substance and Sexual Activity   Alcohol use: Yes    Comment: 4 a  week   Drug use: No   Sexual activity: Yes  Other Topics Concern   Not on file  Social History Narrative   Former smoker- quit in the 40's   Married    2 children: 1982- daughter Chiropractor- lives in [place]- lives locally   2 grandchildren in [place]   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   BP 126/74   Pulse (!) 57   Ht 5\' 7"  (1.702 m)   Wt 90.3 kg   SpO2 97%   BMI 31.17 kg/m   Physical Examination: General: Well developed, well nourished, NAD  HEENT: OP clear, mucus membranes moist  SKIN: warm, dry. No rashes. Neuro: No focal deficits  Musculoskeletal: Muscle strength 5/5 all ext  Psychiatric: Mood and affect normal  Neck: No JVD, no carotid bruits, no thyromegaly, no lymphadenopathy.  Lungs:Clear bilaterally, no wheezes, rhonci, crackles Cardiovascular: Regular rate and rhythm. No murmurs, gallops or rubs. Abdomen:Soft. Bowel sounds present. Non-tender.  Extremities: No lower extremity edema. Pulses are 2 + in the bilateral DP/PT.  EKG:  EKG is ordered today. The ekg ordered today demonstrates Sinus brady, rate 57 bpm  Recent Labs: 04/29/2021: ALT 47 12/10/2021: BUN 17; Creatinine, Ser 0.92; Potassium 4.3; Sodium 139; TSH 0.72   Lipid Panel    Component Value Date/Time   CHOL 153 04/29/2021 1114   TRIG 74.0 04/29/2021 1114   HDL  69.50 04/29/2021 1114   CHOLHDL 2 04/29/2021 1114   VLDL 14.8 04/29/2021 1114   LDLCALC 69 04/29/2021 1114   LDLCALC 60 10/26/2019 0916     Wt Readings from Last 3 Encounters:  04/24/22 90.3 kg  04/20/22 90.2 kg  04/20/22 90.3 kg    Assessment and Plan:   1. Dyspnea/Fatigue: Risk factors for CAD include age, HTN, HLD and known vascular disease (carotid disease). Will arrange a coronary CTA to exclude obstructive CAD. Will arrange an  echo to assess LV function and exclude valvular heart disease. Bubble study to assess for PFO (but we would not do anything about this since she is already on coumadin).  BMET today.   2. Hypercoagulable disorder: Continue coumadin. She would like to change our coumadin clinic. We will arrange f/u there in 3 weeks. She would like to see Hematology here at [facility]. I have given her a suggestion of a provider here.   3. HTN: BP is well controlled. No changes  Labs/ tests ordered today include:   Orders Placed This Encounter  Procedures   CT CORONARY MORPH W/CTA COR W/SCORE W/CA W/CM &/OR WO/CM   Basic Metabolic Panel (BMET)   EKG 12-Lead   ECHOCARDIOGRAM COMPLETE BUBBLE STUDY   Disposition:   F/U with me in 6 months    Signed, Lauree Chandler, MD, [facility] 04/24/2022 12:21 PM    [address], [place], [place]  [address] Phone: [phone]; Fax: [phone]

## 2022-04-24 NOTE — Telephone Encounter (Signed)
[name], RN  [facility] [address] Anticoag Hey! This patient established care w Dr. Angelena Form today.  She was being followed previously at [facility].  She takes Coumadin for hypercoagulable state and her next check is supposed to be in about 3 weeks.  Can you call her and schedule an appointment to establish?   Called pt and LMOM to make appt.

## 2022-04-24 NOTE — Patient Instructions (Addendum)
Medication Instructions:  No changes *If you need a refill on your cardiac medications before your next appointment, please call your pharmacy*   Lab Work: Today: BMET If you have labs (blood work) drawn today and your tests are completely normal, you will receive your results only by: [place] (if you have MyChart) OR A paper copy in the mail If you have any lab test that is abnormal or we need to change your treatment, we will call you to review the results.   Testing/Procedures: echo w bubble study Your physician has requested that you have an echocardiogram. Echocardiography is a painless test that uses sound waves to create images of your heart. It provides your doctor with information about the size and shape of your heart and how well your heart's chambers and valves are working. This procedure takes approximately one hour. There are no restrictions for this procedure. Please do NOT wear cologne, perfume, aftershave, or lotions (deodorant is allowed). Please arrive 15 minutes prior to your appointment time.  Cardiac CTA - see instructions below   Follow-Up: At [facility], you and your health needs are our priority.  As part of our continuing mission to provide you with exceptional heart care, we have created designated Provider Care Teams.  These Care Teams include your primary Cardiologist (physician) and Advanced Practice Providers (APPs -  Physician Assistants and Nurse Practitioners) who all work together to provide you with the care you need, when you need it.   Your next appointment:   6 month(s)  Provider:   Lauree Chandler, MD       Your cardiac CT will be scheduled at [facility] [place], [place] [address] [phone]   Please arrive at the [facility] and Children's Entrance (Entrance C2) of [facility] 30 minutes prior to test start time. You can use the FREE valet parking offered at entrance C  (encouraged to control the heart rate for the test)  Proceed to the [facility] Radiology Department (first floor) to check-in and test prep.  All radiology patients and guests should use entrance C2 at [facility], accessed from [facility], even though the hospital's physical address listed is [address]      Please follow these instructions carefully (unless otherwise directed):   On the Night Before the Test: Be sure to Drink plenty of water. Do not consume any caffeinated/decaffeinated beverages or chocolate 12 hours prior to your test. Do not take any antihistamines 12 hours prior to your test.  On the Day of the Test: Drink plenty of water until 1 hour prior to the test. Do not eat any food 1 hour prior to test. You may take your regular medications prior to the test.  FEMALES- please wear underwire-free bra if available, avoid dresses & tight clothing   After the Test: Drink plenty of water. After receiving IV contrast, you may experience a mild flushed feeling. This is normal. On occasion, you may experience a mild rash up to 24 hours after the test. This is not dangerous. If this occurs, you can take Benadryl 25 mg and increase your fluid intake. If you experience trouble breathing, this can be serious. If it is severe call 911 IMMEDIATELY. If it is mild, please call our office. If you take any of these medications: Glipizide/Metformin, Avandament, Glucavance, please do not take 48 hours after completing test unless otherwise instructed.  We will call to schedule your test  2-4 weeks out understanding that some insurance companies will need an authorization prior to the service being performed.   For non-scheduling related questions, please contact the cardiac imaging nurse navigator should you have any questions/concerns: Marchia Bond, Cardiac Imaging Nurse Navigator Gordy Clement, Cardiac Imaging Nurse Navigator [place] [facility] Direct Office Dial: [phone]   For scheduling needs, including cancellations and rescheduling, please call [place], [phone].

## 2022-04-25 LAB — BASIC METABOLIC PANEL
BUN/Creatinine Ratio: 18 (ref 12–28)
BUN: 14 mg/dL (ref 8–27)
CO2: 22 mmol/L (ref 20–29)
Calcium: 9.5 mg/dL (ref 8.7–10.3)
Chloride: 104 mmol/L (ref 96–106)
Creatinine, Ser: 0.77 mg/dL (ref 0.57–1.00)
Glucose: 98 mg/dL (ref 70–99)
Potassium: 4.2 mmol/L (ref 3.5–5.2)
Sodium: 144 mmol/L (ref 134–144)
eGFR: 88 mL/min/{1.73_m2} (ref >59)

## 2022-04-27 NOTE — Telephone Encounter (Addendum)
Called and spoke with pt. Pt states her INR has been managed by [facility]; however she now sees Dr Angelena Form and was referred to [facility]. Dx: TIA and hypercoagulable state (antiphospholipid antibody syndrome). I  made pt aware that she will be required to have PT/INR by the lab due to antiphospholipid syndrome. Pt verbalized understanding. Pt requested to have labs drawn at [facility] office in [facility]. Order placed. Patient will go on 05/05/22. Instructed that [facility] office is open Mon - Fri 8-11:30 and 1:00-4:00 in [address] on the 3rd floor. Educated pt that the [facility] will contact her as soon as labs are results and will discuss results as well as provide her with education on diet, medication interaction and dosing instructions.   Patient's Current Dose/Goal: 10mg  daily except 5mg  on Mondays, Wednesdays, and Fridays;  Goal: 2.0-3.0

## 2022-05-01 ENCOUNTER — Other Ambulatory Visit: Payer: Self-pay | Admitting: Family

## 2022-05-06 ENCOUNTER — Ambulatory Visit (INDEPENDENT_AMBULATORY_CARE_PROVIDER_SITE_OTHER): Payer: BC Managed Care – PPO

## 2022-05-06 DIAGNOSIS — Z7901 Long term (current) use of anticoagulants: Secondary | ICD-10-CM

## 2022-05-06 DIAGNOSIS — D6859 Other primary thrombophilia: Secondary | ICD-10-CM

## 2022-05-06 DIAGNOSIS — G459 Transient cerebral ischemic attack, unspecified: Secondary | ICD-10-CM | POA: Diagnosis not present

## 2022-05-06 LAB — PROTIME-INR
INR: 2.4 — ABNORMAL HIGH (ref 0.9–1.2)
Prothrombin Time: 24.6 s — ABNORMAL HIGH (ref 9.1–12.0)

## 2022-05-06 NOTE — Patient Instructions (Signed)
Description   Continue to take 10mg  daily EXCEPT 5mg  on Monday, Wednesday, and Friday.  Stay consistent with greens each week (4-5 servings per week) Recheck INR in 3 weeks  Coumadin Clinic [phone]     A full discussion of the nature of anticoagulants has been carried out.  A benefit risk analysis has been presented to the patient, so that they understand the justification for choosing anticoagulation at this time. The need for frequent and regular monitoring, precise dosage adjustment and compliance is stressed.  Side effects of potential bleeding are discussed.  The patient should avoid any OTC items containing aspirin or ibuprofen, and should avoid great swings in general diet.  Avoid alcohol consumption.

## 2022-05-11 ENCOUNTER — Ambulatory Visit
Admission: RE | Admit: 2022-05-11 | Discharge: 2022-05-11 | Disposition: A | Discharge status: Home / Self Care | Payer: BC Managed Care – PPO | Source: Ambulatory Visit

## 2022-05-11 DIAGNOSIS — Z1231 Encounter for screening mammogram for malignant neoplasm of breast: Secondary | ICD-10-CM

## 2022-05-13 ENCOUNTER — Telehealth (HOSPITAL_COMMUNITY): Payer: Self-pay | Admitting: Emergency Medicine

## 2022-05-13 NOTE — Telephone Encounter (Signed)
Attempted to call patient regarding upcoming cardiac CT appointment. °Left message on voicemail with name and callback number °Marquize Seib RN Navigator Cardiac Imaging °[facility] °[phone] Office °[phone] Cell ° °

## 2022-05-14 ENCOUNTER — Ambulatory Visit (INDEPENDENT_AMBULATORY_CARE_PROVIDER_SITE_OTHER): Payer: BC Managed Care – PPO | Admitting: Physician Assistant

## 2022-05-14 ENCOUNTER — Ambulatory Visit (HOSPITAL_COMMUNITY)
Admission: RE | Admit: 2022-05-14 | Discharge: 2022-05-14 | Disposition: A | Discharge status: Home / Self Care | Payer: BC Managed Care – PPO | Source: Ambulatory Visit | Attending: Vascular Surgery | Admitting: Vascular Surgery

## 2022-05-14 VITALS — BP 140/85 | HR 62 | Temp 97.5°F | Resp 20 | Ht 67.0 in | Wt 198.1 lb

## 2022-05-14 DIAGNOSIS — I872 Venous insufficiency (chronic) (peripheral): Secondary | ICD-10-CM | POA: Diagnosis not present

## 2022-05-14 DIAGNOSIS — M7989 Other specified soft tissue disorders: Secondary | ICD-10-CM | POA: Diagnosis present

## 2022-05-14 NOTE — Progress Notes (Signed)
Office Note     CC:  follow up Requesting Provider:  [name], NP  HPI: Melinda Lopez is a 62 y.o. (1960-11-18) female who presents for evaluation of bilateral lower extremity edema.  Over the past several months to a year she has noticed increasing edema of bilateral lower extremities.  She is also experiencing heaviness and fatigue over the course of the day which is worse at the end of the day.  She denies any history of DVT, venous ulcerations, trauma, or vascular interventions.  She has worn compression in the past.  She makes an effort to elevate her legs during the day.  She tries to avoid prolonged sitting and standing.  She denies tobacco use.  Past medical history also significant for carotid artery stenosis with history of right carotid endarterectomy in [place] by Dr. Lollie Sails in 2013.   Past Medical History:  Diagnosis Date   Carotid artery stenosis    Chronic headaches    Fatty liver 04/07/2016   Glaucoma    Hx of adenomatous polyp of colon 07/30/2011   Hx of blood clots    fingers   Hyperlipemia    Hypertension    Hypothyroidism    Migraine 1982   pt states migraines since then.    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  Tobacco Use   Smoking status: Former    Types: Cigarettes    Quit date: 07/29/2002    Years since quitting: 19.8   Smokeless tobacco: Never  Vaping Use   Vaping Use: Never used  Substance and Sexual Activity    Alcohol use: Yes    Comment: 4 a week   Drug use: No   Sexual activity: Yes  Other Topics Concern   Not on file  Social History Narrative   Former smoker- quit in the 19's   Married    2 children: 1982- daughter Corporate treasurer- lives in [place] Chrissie Noa- lives locally   2 grandchildren in [place]   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   Social Determinants of Corporate investment banker Strain: Not on Ship broker Insecurity: Not on file  Transportation Needs: Not on file  Physical Activity: Not on file  Stress: Not on file  Social Connections: Not on file  Intimate Partner Violence: Not on file    Family History  Problem Relation Age of Onset   Heart disease Mother        Vascular disease early 2s   Kidney disease Mother    Heart disease Father    Diabetes Sister    Kidney disease Sister    Heart murmur Brother    Heart disease Maternal Grandmother     Current Outpatient Medications  Medication Sig Dispense Refill   ALPRAZolam (XANAX) 0.5 MG tablet Take 1/2-1 tablet by mouth prior to flying. 20 tablet 0   amLODipine (NORVASC) 5 MG  tablet TAKE 1 TABLET (5 MG TOTAL) BY MOUTH DAILY. 90 tablet 1   atorvastatin (LIPITOR) 20 MG tablet TAKE 1 TABLET BY MOUTH EVERY DAY 90 tablet 1   EPINEPHRINE 0.3 mg/0.3 mL IJ SOAJ injection Inject 0.3 mg into the muscle as needed for anaphylaxis. Use as needed for severe allergic reaction 2 each 2   erythromycin ophthalmic ointment Place 1 application. into both eyes 3 (three) times daily. 3.5 g 0   fluticasone (FLONASE) 50 MCG/ACT nasal spray PLACE 1 SPRAY INTO BOTH NOSTRILS 2 (TWO) TIMES DAILY 48 mL 2   influenza vac split quadrivalent PF (FLUARIX) 0.5 ML injection Inject into the muscle. 0.5 mL 0   levothyroxine (SYNTHROID) 100 MCG tablet TAKE 1 TABLET BY MOUTH EVERY DAY DUE 06/21/21 90 tablet 1   omeprazole (PRILOSEC) 40 MG capsule Take 1 capsule (40 mg total) by mouth daily. 90 capsule 1   SUMAtriptan (IMITREX) 50 MG  tablet MAY REPEAT IN 2 HOURS IF HEADACHE PERSISTS OR RECURS. 10 tablet 2   triamcinolone cream (KENALOG) 0.1 % APPLY 1 APPLICATION. TOPICALLY 2 (TWO) TIMES DAILY. 30 g 0   warfarin (COUMADIN) 10 MG tablet Take 10 mg by mouth daily.     Current Facility-Administered Medications  Medication Dose Route Frequency Provider Last Rate Last Admin   0.9 %  sodium chloride infusion   Intravenous PRN Bhagat, Bhavinkumar, PA        Allergies  Allergen Reactions   Shellfish Allergy Anaphylaxis   Codeine Nausea Only   Morphine Other (See Comments)    Other reaction(s): Other (See Comments), slurring words, slurring words   Morphine And Related Other (See Comments)    slurring words   Phenergan [Promethazine] Other (See Comments)    Causes blood presser to bottom out   Sulfa Antibiotics Rash and Nausea And Vomiting    Nausea     REVIEW OF SYSTEMS:   [X]  denotes positive finding, [ ]  denotes negative finding Cardiac  Comments:  Chest pain or chest pressure:    Shortness of breath upon exertion:    Short of breath when lying flat:    Irregular heart rhythm:        Vascular    Pain in calf, thigh, or hip brought on by ambulation:    Pain in feet at night that wakes you up from your sleep:     Blood clot in your veins:    Leg swelling:         Pulmonary    Oxygen at home:    Productive cough:     Wheezing:         Neurologic    Sudden weakness in arms or legs:     Sudden numbness in arms or legs:     Sudden onset of difficulty speaking or slurred speech:    Temporary loss of vision in one eye:     Problems with dizziness:         Gastrointestinal    Blood in stool:     Vomited blood:         Genitourinary    Burning when urinating:     Blood in urine:        Psychiatric    Major depression:         Hematologic    Bleeding problems:    Problems with blood clotting too easily:        Skin    Rashes or ulcers:  Constitutional    Fever or chills:      PHYSICAL  EXAMINATION:  Vitals:   05/14/22 1428  BP: (!) 140/85  Pulse: 62  Resp: 20  Temp: (!) 97.5 F (36.4 C)  TempSrc: Temporal  SpO2: 97%  Weight: 198 lb 1.6 oz (89.9 kg)  Height: 5\' 7"  (1.702 m)    General:  WDWN in NAD; vital signs documented above Gait: Not observed HENT: WNL, normocephalic Pulmonary: normal non-labored breathing , without Rales, rhonchi,  wheezing Cardiac: regular HR Abdomen: soft, NT, no masses Skin: without rashes Vascular Exam/Pulses: Palpable DP and PT pulses bilateral lower extremities Extremities: without ischemic changes, without Gangrene , without cellulitis; without open wounds; spider veins medial right lower leg; no apparent varicosities Musculoskeletal: no muscle wasting or atrophy  Neurologic: A&O X 3 Psychiatric:  The pt has Normal affect.   Non-Invasive Vascular Imaging:   Right lower extremity venous reflux study negative for DVT Competent deep system Incompetent greater saphenous vein from the level of the saphenofemoral junction to the distal thigh with diameter greater than 64mm    ASSESSMENT/PLAN:: 62 y.o. female here for evaluation of bilateral lower extremity edema  -Worsening bilateral lower extremity edema over the past several months.  Right leg venous reflux study demonstrates an incompetent greater saphenous vein from the saphenofemoral junction down to the distal thigh with a diameter greater than 4 mm throughout.  Recommendations include thigh-high compression 20 to 30 mmHg to be worn on a regular basis.  She should also elevate her legs periodically throughout the day above the level of her heart.  She will also try to avoid prolonged sitting and standing.  She will continue to try to be as active as possible.  She will follow-up in 3 months with Dr. Myra Gianotti for further evaluation to see if she would be a candidate for laser ablation therapy.  We will also check a left lower extremity venous reflux study given that she has similar  symptoms on the left leg as well.  The above plan was discussed with the patient and she is agreeable to proceed.   Emilie Rutter, PA-C [facility] [phone]  Clinic MD:   Edilia Bo

## 2022-05-15 ENCOUNTER — Ambulatory Visit (HOSPITAL_COMMUNITY): Payer: BC Managed Care – PPO

## 2022-05-18 ENCOUNTER — Other Ambulatory Visit: Payer: Self-pay

## 2022-05-18 ENCOUNTER — Encounter: Payer: Self-pay | Admitting: *Deleted

## 2022-05-18 DIAGNOSIS — M7989 Other specified soft tissue disorders: Secondary | ICD-10-CM

## 2022-05-18 DIAGNOSIS — I872 Venous insufficiency (chronic) (peripheral): Secondary | ICD-10-CM

## 2022-05-19 ENCOUNTER — Ambulatory Visit (HOSPITAL_COMMUNITY): Payer: BC Managed Care – PPO

## 2022-05-21 ENCOUNTER — Telehealth (HOSPITAL_COMMUNITY): Payer: Self-pay | Admitting: *Deleted

## 2022-05-21 NOTE — Telephone Encounter (Signed)
Reaching out to patient to offer assistance regarding upcoming cardiac imaging study; pt verbalizes understanding of appt date/time, parking situation and where to check in, pre-test NPO status, and verified current allergies; name and call back number provided for further questions should they arise  Fremont Skalicky RN Navigator Cardiac Imaging [facility] [phone] office [phone] cell  Patient aware to arrive at 9:30am. 

## 2022-05-22 ENCOUNTER — Ambulatory Visit (HOSPITAL_COMMUNITY)
Admission: RE | Admit: 2022-05-22 | Discharge: 2022-05-22 | Disposition: A | Discharge status: Home / Self Care | Payer: BC Managed Care – PPO | Source: Ambulatory Visit | Attending: Cardiovascular Disease | Admitting: Cardiovascular Disease

## 2022-05-22 DIAGNOSIS — R0602 Shortness of breath: Secondary | ICD-10-CM | POA: Insufficient documentation

## 2022-05-22 DIAGNOSIS — I2510 Atherosclerotic heart disease of native coronary artery without angina pectoris: Secondary | ICD-10-CM

## 2022-05-22 MED ORDER — IOHEXOL 350 MG/ML SOLN
100.0000 mL | Freq: Once | INTRAVENOUS | Status: AC | PRN
  Administered 2022-05-22: 100 mL via INTRAVENOUS

## 2022-05-22 MED ORDER — NITROGLYCERIN 0.4 MG SL SUBL
SUBLINGUAL_TABLET | SUBLINGUAL | Status: AC
  Filled 2022-05-22: qty 2

## 2022-05-22 MED ORDER — NITROGLYCERIN 0.4 MG SL SUBL
0.8000 mg | SUBLINGUAL_TABLET | Freq: Once | SUBLINGUAL | Status: AC
  Administered 2022-05-22: 0.8 mg via SUBLINGUAL

## 2022-05-25 ENCOUNTER — Telehealth: Payer: Self-pay | Admitting: Cardiovascular Disease

## 2022-05-25 DIAGNOSIS — E782 Mixed hyperlipidemia: Secondary | ICD-10-CM

## 2022-05-25 NOTE — Telephone Encounter (Signed)
-----   Message from Kathleene Hazel, MD sent at 05/25/2022 12:28 PM EDT ----- Her cardiac CTA shows that she has mild plaque in her coronary arteries. She is on coumadin but would recommend starting ASA 81 mg daily. Continue Lipitor. Echo is pending in two weeks. chris

## 2022-05-25 NOTE — Telephone Encounter (Signed)
Called and reviewed details at length with patient. She states that she will begin the aspirin. Has several questions around plaque formation and dietary measures to help prevent worsening. Requests labs be done for full cholesterol panel. All questions answered and NMR, ApoB, and LP(a) orders placed and scheduled for 06/05/22 per pt request.

## 2022-05-26 ENCOUNTER — Ambulatory Visit (INDEPENDENT_AMBULATORY_CARE_PROVIDER_SITE_OTHER): Payer: BC Managed Care – PPO | Admitting: *Deleted

## 2022-05-26 DIAGNOSIS — Z5181 Encounter for therapeutic drug level monitoring: Secondary | ICD-10-CM | POA: Diagnosis not present

## 2022-05-26 DIAGNOSIS — D6859 Other primary thrombophilia: Secondary | ICD-10-CM

## 2022-05-26 DIAGNOSIS — Z8679 Personal history of other diseases of the circulatory system: Secondary | ICD-10-CM

## 2022-05-26 LAB — PROTIME-INR
INR: 2.1 — ABNORMAL HIGH (ref 0.9–1.2)
Prothrombin Time: 21.6 s — ABNORMAL HIGH (ref 9.1–12.0)

## 2022-05-26 NOTE — Patient Instructions (Addendum)
Description   Spoke with pt and advised to continue taking  daily EXCEPT  on Monday, Wednesday, and Friday. Stay consistent with greens each week (4-5 servings per week). Recheck INR in 3 weeks prior to going out of town.  Coumadin Clinic [phone]

## 2022-06-01 ENCOUNTER — Other Ambulatory Visit: Payer: Self-pay | Admitting: Family

## 2022-06-05 ENCOUNTER — Ambulatory Visit: Payer: BC Managed Care – PPO | Attending: Cardiovascular Disease

## 2022-06-05 DIAGNOSIS — E782 Mixed hyperlipidemia: Secondary | ICD-10-CM

## 2022-06-06 LAB — NMR, LIPOPROFILE
HDL Particle Number: 38.6 umol/L (ref >=30.5)
LDL Particle Number: 582 nmol/L (ref <1000)
LDL Size: 21 nm (ref >20.5)

## 2022-06-06 LAB — APOLIPOPROTEIN B

## 2022-06-07 LAB — NMR, LIPOPROFILE
Cholesterol, Total: 155 mg/dL (ref 100–199)
HDL-C: 71 mg/dL (ref >39)
LP-IR Score: 29 (ref <=45)
Triglycerides: 72 mg/dL (ref 0–149)

## 2022-06-09 ENCOUNTER — Ambulatory Visit (HOSPITAL_COMMUNITY): Payer: BC Managed Care – PPO | Attending: Cardiology

## 2022-06-09 DIAGNOSIS — I10 Essential (primary) hypertension: Secondary | ICD-10-CM | POA: Insufficient documentation

## 2022-06-09 DIAGNOSIS — D6859 Other primary thrombophilia: Secondary | ICD-10-CM | POA: Diagnosis present

## 2022-06-09 DIAGNOSIS — R0602 Shortness of breath: Secondary | ICD-10-CM | POA: Insufficient documentation

## 2022-06-09 LAB — NMR, LIPOPROFILE
LDL-C (NIH Calc): 70 mg/dL (ref 0–99)
Small LDL Particle Number: 187 nmol/L (ref <=527)

## 2022-06-09 LAB — LIPOPROTEIN A (LPA): Lipoprotein (a): 11.1 nmol/L (ref <75.0)

## 2022-06-10 ENCOUNTER — Ambulatory Visit: Payer: BC Managed Care – PPO | Admitting: Family

## 2022-06-10 ENCOUNTER — Encounter: Payer: Self-pay | Admitting: Family

## 2022-06-10 VITALS — BP 138/62 | HR 57 | Temp 98.0°F | Resp 16 | Ht 67.0 in | Wt 198.0 lb

## 2022-06-10 DIAGNOSIS — R739 Hyperglycemia, unspecified: Secondary | ICD-10-CM

## 2022-06-10 DIAGNOSIS — I10 Essential (primary) hypertension: Secondary | ICD-10-CM

## 2022-06-10 DIAGNOSIS — R102 Pelvic and perineal pain: Secondary | ICD-10-CM | POA: Diagnosis not present

## 2022-06-10 DIAGNOSIS — E039 Hypothyroidism, unspecified: Secondary | ICD-10-CM

## 2022-06-10 DIAGNOSIS — D6859 Other primary thrombophilia: Secondary | ICD-10-CM

## 2022-06-10 DIAGNOSIS — K219 Gastro-esophageal reflux disease without esophagitis: Secondary | ICD-10-CM

## 2022-06-10 DIAGNOSIS — Z0001 Encounter for general adult medical examination with abnormal findings: Secondary | ICD-10-CM

## 2022-06-10 DIAGNOSIS — G43909 Migraine, unspecified, not intractable, without status migrainosus: Secondary | ICD-10-CM

## 2022-06-10 DIAGNOSIS — E785 Hyperlipidemia, unspecified: Secondary | ICD-10-CM

## 2022-06-10 DIAGNOSIS — E559 Vitamin D deficiency, unspecified: Secondary | ICD-10-CM

## 2022-06-10 MED ORDER — ERYTHROMYCIN 5 MG/GM OP OINT: 1.0000 | TOPICAL_OINTMENT | Freq: Three times a day (TID) | OPHTHALMIC | 0 refills | Status: DC

## 2022-06-10 MED ORDER — EPINEPHRINE 0.3 MG/0.3ML IJ SOAJ: 0.3000 mg | INTRAMUSCULAR | 2 refills | Status: DC | PRN

## 2022-06-10 MED ORDER — TRIAMCINOLONE ACETONIDE 0.1 % EX CREA: 1.0000 | TOPICAL_CREAM | Freq: Two times a day (BID) | CUTANEOUS | 0 refills | Status: DC | PRN

## 2022-06-10 MED ORDER — SUMATRIPTAN SUCCINATE 50 MG PO TABS
ORAL_TABLET | ORAL | 2 refills | Status: DC
Start: 2022-06-10 — End: 2022-09-01

## 2022-06-10 NOTE — Assessment & Plan Note (Signed)
Stable on omeprazole. 

## 2022-06-10 NOTE — Assessment & Plan Note (Signed)
BP Readings from Last 3 Encounters:  06/10/22 138/62  05/22/22 120/70  05/14/22 (!) 140/85   BP at goal, continue amlodipine 5mg .

## 2022-06-10 NOTE — Progress Notes (Addendum)
Subjective:   By signing my name below, I, Barrett Shell, attest that this documentation has been prepared under the direction and in the presence of [name], NP. 06/10/2022   Patient ID: Melinda Lopez, female    DOB: 1960/11/20, 62 y.o.   MRN: [id number]  Chief Complaint  Patient presents with   Annual Exam    HPI Patient is in today for a comprehensive physical exam.   Weight: She is interested in seeing a dietician. She has been putting a lot of effort into losing weight including calorie counting and intermittent fasting and has not lost any weight. She does not eat sweets or drink soda. She walks 5 times a week. Her glucose levels were 98 during her last blood work.  Wt Readings from Last 3 Encounters:  06/10/22 198 lb (89.8 kg)  05/14/22 198 lb 1.6 oz (89.9 kg)  04/24/22 199 lb (90.3 kg)   Acid reflux: She has not had any recent acid reflux flare-ups. She is taking 40 mg omeprazole daily PO and finds relief while taking it.   Vitamin D: She is not taking Vitamin D at this time.   RLQ abdominal pain: She has intermittent abdominal in the RLQ. She has some discomfort upon palpation to the area.   Acute: She denies fever, unexpected weight change, adenopathy, new moles, sinus pain, sore throat, visual disturbance, chest pain, palpitations, leg swelling, cough, shortness of breath, wheezing, nausea, vomiting, diarrhea, constipation, blood in stool, dysuria, frequency, hematuria, new muscle pain, new joint pain, headaches, depression or anxiety at this time.  Colonoscopy: Last colonoscopy completed on 09/28/2017. Results are normal. Repeat in 10 years.   Dexa: Last Dexa completed on 08/01/2021. The BMD measured at Femur Neck Right is 0.864 g/cm2 with a T-score of -1.3. This patient is considered osteopenic according to World Health Organization [facility]) criteria.  Pap Smear: Last pap smear completed on 04/20/2022. Results are normal. Repeat in 3 years.   Mammogram: Last  mammogram completed on 05/20/2021. Results are normal. Repeat in 1 year.   Social history: No changes in family history. No recent surgeries. She has plaque build up in her arteries. She does not smoke tobacco products.   Immunizations: She is UTD on the tetanus immunizations. She is not interested in receiving the shingrix immunization.   Diet: She follows a balanced diet.  Exercise: She walks five times weekly.  Dental: She is UTD on dental.  Vision:  She has an upcoming vision appointment.   She is requesting a refill on her erythromycin eye ointment that she likes to have on travel.  She also sometimes develops a rash on her legs and likes to keep triamcinolone on hand.  Notes bruising on her left arm from IV placement yesterday.     Past Medical History:  Diagnosis Date   Carotid artery stenosis    Chronic headaches    Fatty liver 04/07/2016   Glaucoma    Hx of adenomatous polyp of colon 07/30/2011   Hx of blood clots    fingers   Hyperlipemia    Hypertension    Hypothyroidism    Migraine 1982   pt states migraines since then.    PFO (patent foramen ovale)    TIA (transient ischemic attack)     Past Surgical History:  Procedure Laterality Date   BREAST LUMPECTOMY Right 2000   CAROTID ARTERY - SUBCLAVIAN ARTERY BYPASS GRAFT Right 2006   COLONOSCOPY     UPPER GASTROINTESTINAL ENDOSCOPY  Family History  Problem Relation Age of Onset   Heart disease Mother        Vascular disease early 54s   Kidney disease Mother    Heart disease Father    Diabetes Sister    Kidney disease Sister    Heart murmur Brother    Heart disease Maternal Grandmother     Social History   Socioeconomic History   Marital status: Married    Spouse name: Not on file   Number of children: 2   Years of education: Not on file   Highest education level: Not on file  Occupational History   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  Tobacco Use   Smoking  status: Former    Types: Cigarettes    Quit date: 07/29/2002    Years since quitting: 19.8   Smokeless tobacco: Never  Vaping Use   Vaping Use: Never used  Substance and Sexual Activity   Alcohol use: Yes    Comment: 4 a week   Drug use: No   Sexual activity: Yes    Partners: Male  Other Topics Concern   Not on file  Social History Narrative   Former smoker- quit in the 42's   Married    2 children: 1982- daughter Corporate treasurer- lives in [place] Chrissie Noa- lives locally   2 grandchildren in [place]   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 20 tablet 0   amLODipine (NORVASC) 5 MG tablet TAKE 1 TABLET (5 MG TOTAL) BY MOUTH DAILY. 90 tablet 1   Ascorbic Acid (VITA-C PO) Take 1 tablet by mouth daily.     aspirin EC 81 MG tablet Take 81 mg by mouth daily. Swallow whole.     atorvastatin (LIPITOR) 20 MG tablet TAKE 1 TABLET BY MOUTH EVERY DAY 90 tablet 1   Cyanocobalamin (VITAMIN B-12 PO) Take 1 tablet by mouth daily.     fluticasone (FLONASE) 50 MCG/ACT nasal spray PLACE 1 SPRAY INTO BOTH NOSTRILS 2 (TWO) TIMES DAILY 48 mL 2   influenza vac split quadrivalent PF (FLUARIX) 0.5 ML injection Inject into the muscle. 0.5 mL 0   levothyroxine (SYNTHROID) 100 MCG tablet TAKE 1 TABLET BY MOUTH EVERY DAY DUE 06/21/21 90 tablet 1   omeprazole (PRILOSEC) 40 MG capsule TAKE 1 CAPSULE (40 MG TOTAL) BY MOUTH DAILY. 90 capsule 1   triamcinolone cream (KENALOG) 0.1 % APPLY 1 APPLICATION. TOPICALLY 2 (TWO) TIMES DAILY. 30 g 0   warfarin (COUMADIN) 10 MG tablet Take 10 mg  by mouth daily.     EPINEPHRINE 0.3 mg/0.3 mL IJ SOAJ injection Inject 0.3 mg into the muscle as needed for anaphylaxis. Use as needed for severe allergic reaction 2 each 2   erythromycin ophthalmic ointment Place 1 application. into both eyes 3 (three) times daily. 3.5 g 0   SUMAtriptan (IMITREX) 50 MG tablet MAY REPEAT IN 2 HOURS IF HEADACHE PERSISTS OR RECURS. 10 tablet 2   Cholecalciferol (VITAMIN D-3 PO) Take 1 tablet by mouth daily.  Facility-Administered Medications Prior to Visit  Medication Dose Route Frequency Provider Last Rate Last Admin   0.9 %  sodium chloride infusion   Intravenous PRN Bhagat, Bhavinkumar, PA        Allergies  Allergen Reactions   Shellfish Allergy Anaphylaxis   Codeine Nausea Only   Morphine Other (See Comments)    Other reaction(s): Other (See Comments), slurring words, slurring words   Morphine And Related Other (See Comments)    slurring words   Phenergan [Promethazine] Other (See Comments)    Causes blood presser to bottom out   Sulfa Antibiotics Rash and Nausea And Vomiting    Nausea    Review of Systems  Constitutional:  Negative for fever.       (-) unexpected weight change  HENT:  Negative for sinus pain and sore throat.   Eyes:        (-) visual disturbance  Respiratory:  Negative for cough, shortness of breath and wheezing.   Cardiovascular:  Negative for chest pain, palpitations and leg swelling.  Gastrointestinal:  Negative for blood in stool, constipation, diarrhea, nausea and vomiting.  Genitourinary:  Negative for dysuria, frequency and hematuria.  Musculoskeletal:        (-) new joint pain (-) new muscle pain  Skin:        (-) new moles  Neurological:  Negative for headaches.  Psychiatric/Behavioral:  Negative for depression.        (-) anxiety       Objective:    Physical Exam Constitutional:      General: She is not in acute distress.    Appearance: Normal appearance.  HENT:     Head: Normocephalic and  atraumatic.     Right Ear: Tympanic membrane, ear canal and external ear normal.     Left Ear: Tympanic membrane, ear canal and external ear normal.     Mouth/Throat:     Mouth: Mucous membranes are moist.     Pharynx: Oropharynx is clear.  Eyes:     Extraocular Movements: Extraocular movements intact.     Right eye: No nystagmus.     Left eye: No nystagmus.     Pupils: Pupils are equal, round, and reactive to light.  Cardiovascular:     Rate and Rhythm: Normal rate and regular rhythm.     Heart sounds: Normal heart sounds. No murmur heard.    No gallop.  Pulmonary:     Effort: Pulmonary effort is normal. No respiratory distress.     Breath sounds: Normal breath sounds. No wheezing or rales.  Abdominal:     General: Bowel sounds are normal. There is no distension.     Palpations: Abdomen is soft.     Tenderness: There is no abdominal tenderness. There is no guarding.     Comments: Mild RLQ tenderness right above the hip   Musculoskeletal:     Cervical back: Normal range of motion.     Comments: (+) 5/5 strength in upper and lower extremities  Lymphadenopathy:     Cervical: No cervical adenopathy.  Skin:    General: Skin is warm.     Comments: Approximately 1 inch wide Hematoma on left AC fossa  Neurological:     Mental Status: She is alert and oriented to person, place, and time.     Deep Tendon Reflexes:     Reflex Scores:      Patellar reflexes are 2+ on the right side and 2+ on the left side. Psychiatric:  Judgment: Judgment normal.     BP 138/62 (Patient Position: Sitting, Cuff Size: Small)   Pulse (!) 57   Temp 98 F (36.7 C) (Oral)   Resp 16   Ht 5\' 7"  (1.702 m)   Wt 198 lb (89.8 kg)   SpO2 99%   BMI 31.01 kg/m  Wt Readings from Last 3 Encounters:  06/10/22 198 lb (89.8 kg)  05/14/22 198 lb 1.6 oz (89.9 kg)  04/24/22 199 lb (90.3 kg)       Assessment & Plan:  Encounter for general adult medical examination with abnormal findings Assessment &  Plan: She is requesting referral to nutrition- placed.  Continue healthy diet, exercise and weight loss efforts. Colo up to date.  She will schedule mammogram.  Pap up to date. Declines shingrix.  Encouraged her to get flu shot and covid booster this fall.    Hypothyroidism, unspecified type -     TSH; Future  Hyperglycemia -     Hemoglobin A1c; Future  Essential hypertension Assessment & Plan: BP Readings from Last 3 Encounters:  06/10/22 138/62  05/22/22 120/70  05/14/22 (!) 140/85   BP at goal, continue amlodipine 5mg .   Orders: -     Amb ref to Medical Nutrition Therapy-MNT -     Comprehensive metabolic panel; Future  Migraine without status migrainosus, not intractable, unspecified migraine type Assessment & Plan: No recent headaches, wants to keep imitrex on hand. Refill sent.  Orders: -     SUMAtriptan Succinate; May repeat in 2 hours if headache persists or recurs.  Dispense: 10 tablet; Refill: 2  Hyperlipidemia, unspecified hyperlipidemia type Assessment & Plan: Maintained on atorvastatin, check follow up lipid panel.  Orders: -     Lipid panel; Future  Vitamin D deficiency Assessment & Plan: Not currently on supplement. Check follow up level.   Orders: -     VITAMIN D 25 Hydroxy (Vit-D Deficiency, Fractures); Future  Pelvic pain Assessment & Plan: New. Will obtain pelvic US for further evaluation.   Orders: -     US PELVIC COMPLETE WITH TRANSVAGINAL; Future  Hypercoagulable state [facility]) Assessment & Plan: Continues coumadin which is being monitored by the coumadin clinic.    Gastroesophageal reflux disease, unspecified whether esophagitis present Assessment & Plan: Stable on omeprazole.    Other orders -     EPINEPHrine; Inject 0.3 mg into the muscle as needed for anaphylaxis. Use as needed for severe allergic reaction  Dispense: 2 each; Refill: 2 -     Erythromycin; Place 1 Application into both eyes 3 (three) times daily.  Dispense: 3.5 g;  Refill: 0 -     Triamcinolone Acetonide; Apply 1 Application topically 2 (two) times daily as needed.  Dispense: 30 g; Refill: 0    I, Lemont Fillers, NP, personally preformed the services described in this documentation.  All medical record entries made by the scribe were at my direction and in my presence.  I have reviewed the chart and discharge instructions (if applicable) and agree that the record reflects my personal performance and is accurate and complete. 06/10/2022  Lemont Fillers, NP  [facility] as a scribe for Lemont Fillers, NP.,have documented all relevant documentation on the behalf of Lemont Fillers, NP,as directed by  Lemont Fillers, NP while in the presence of Lemont Fillers, NP.

## 2022-06-10 NOTE — Assessment & Plan Note (Signed)
Continues coumadin which is being monitored by the coumadin clinic.

## 2022-06-10 NOTE — Assessment & Plan Note (Signed)
Not currently on supplement. Check follow up level.

## 2022-06-10 NOTE — Assessment & Plan Note (Addendum)
She is requesting referral to nutrition- placed.  Continue healthy diet, exercise and weight loss efforts. Colo up to date.  She will schedule mammogram.  Pap up to date. Declines shingrix.  Encouraged her to get flu shot and covid booster this fall.

## 2022-06-10 NOTE — Assessment & Plan Note (Signed)
New. Will obtain pelvic US for further evaluation.  

## 2022-06-10 NOTE — Assessment & Plan Note (Signed)
Maintained on atorvastatin, check follow up lipid panel.

## 2022-06-10 NOTE — Assessment & Plan Note (Signed)
No recent headaches, wants to keep imitrex on hand. Refill sent.

## 2022-06-11 LAB — ECHOCARDIOGRAM COMPLETE BUBBLE STUDY
Area-P 1/2: 2.77 cm2
Calc EF: 55.6 %
S' Lateral: 3.7 cm
Single Plane A2C EF: 58.2 %
Single Plane A4C EF: 51.8 %

## 2022-06-12 ENCOUNTER — Other Ambulatory Visit (INDEPENDENT_AMBULATORY_CARE_PROVIDER_SITE_OTHER): Payer: BC Managed Care – PPO

## 2022-06-12 DIAGNOSIS — I10 Essential (primary) hypertension: Secondary | ICD-10-CM | POA: Diagnosis not present

## 2022-06-12 DIAGNOSIS — E559 Vitamin D deficiency, unspecified: Secondary | ICD-10-CM

## 2022-06-12 DIAGNOSIS — E039 Hypothyroidism, unspecified: Secondary | ICD-10-CM

## 2022-06-12 DIAGNOSIS — R739 Hyperglycemia, unspecified: Secondary | ICD-10-CM | POA: Diagnosis not present

## 2022-06-12 DIAGNOSIS — E785 Hyperlipidemia, unspecified: Secondary | ICD-10-CM | POA: Diagnosis not present

## 2022-06-12 LAB — LIPID PANEL
Cholesterol: 137 mg/dL (ref 0–200)
HDL: 57 mg/dL (ref >39.00)
LDL Cholesterol: 69 mg/dL (ref 0–99)
NonHDL: 80.16
Total CHOL/HDL Ratio: 2
Triglycerides: 54 mg/dL (ref 0.0–149.0)
VLDL: 10.8 mg/dL (ref 0.0–40.0)

## 2022-06-12 LAB — COMPREHENSIVE METABOLIC PANEL
ALT: 42 U/L — ABNORMAL HIGH (ref 0–35)
AST: 35 U/L (ref 0–37)
Albumin: 4.3 g/dL (ref 3.5–5.2)
Alkaline Phosphatase: 69 U/L (ref 39–117)
BUN: 12 mg/dL (ref 6–23)
CO2: 28 mEq/L (ref 19–32)
Calcium: 9.5 mg/dL (ref 8.4–10.5)
Chloride: 103 mEq/L (ref 96–112)
Creatinine, Ser: 0.73 mg/dL (ref 0.40–1.20)
GFR: 88.7 mL/min (ref >60.00)
Glucose, Bld: 110 mg/dL — ABNORMAL HIGH (ref 70–99)
Potassium: 3.9 mEq/L (ref 3.5–5.1)
Sodium: 140 mEq/L (ref 135–145)
Total Bilirubin: 0.5 mg/dL (ref 0.2–1.2)
Total Protein: 6.8 g/dL (ref 6.0–8.3)

## 2022-06-12 LAB — TSH: TSH: 1.48 u[IU]/mL (ref 0.35–5.50)

## 2022-06-12 LAB — HEMOGLOBIN A1C: Hgb A1c MFr Bld: 5.7 % (ref 4.6–6.5)

## 2022-06-12 LAB — VITAMIN D 25 HYDROXY (VIT D DEFICIENCY, FRACTURES): VITD: 29.32 ng/mL — ABNORMAL LOW (ref 30.00–100.00)

## 2022-06-14 ENCOUNTER — Encounter: Payer: Self-pay | Admitting: Family

## 2022-06-14 DIAGNOSIS — E6609 Other obesity due to excess calories: Secondary | ICD-10-CM

## 2022-06-15 ENCOUNTER — Encounter: Payer: Self-pay | Admitting: *Deleted

## 2022-06-15 ENCOUNTER — Telehealth: Payer: Self-pay | Admitting: *Deleted

## 2022-06-15 DIAGNOSIS — D6859 Other primary thrombophilia: Secondary | ICD-10-CM

## 2022-06-15 NOTE — Telephone Encounter (Signed)
Pt called and stated she would go today or tomorrow to have her INR lab drawn.

## 2022-06-15 NOTE — Telephone Encounter (Signed)
Called pt since INR is due today; there was no answer therefore left a message for her to call back & update [place] with details. Will await a callback and follow up.

## 2022-06-16 ENCOUNTER — Ambulatory Visit (INDEPENDENT_AMBULATORY_CARE_PROVIDER_SITE_OTHER): Payer: BC Managed Care – PPO | Admitting: *Deleted

## 2022-06-16 DIAGNOSIS — G459 Transient cerebral ischemic attack, unspecified: Secondary | ICD-10-CM | POA: Diagnosis not present

## 2022-06-16 DIAGNOSIS — Z7901 Long term (current) use of anticoagulants: Secondary | ICD-10-CM

## 2022-06-16 DIAGNOSIS — D6859 Other primary thrombophilia: Secondary | ICD-10-CM

## 2022-06-16 LAB — PROTIME-INR
INR: 2.1 — ABNORMAL HIGH (ref 0.9–1.2)
Prothrombin Time: 21.4 s — ABNORMAL HIGH (ref 9.1–12.0)

## 2022-06-16 NOTE — Patient Instructions (Signed)
Description   RELEASE THE STANDING ORDER TO HAVE NEXT INR DRAWN. Spoke with pt and advised to continue taking 10mg  daily EXCEPT 5mg  on Monday, Wednesday, and Friday. Stay consistent with greens each week (4-5 servings per week). Recheck INR in 4 weeks at [facility].  Coumadin Clinic [phone]

## 2022-06-17 ENCOUNTER — Ambulatory Visit: Payer: BC Managed Care – PPO

## 2022-06-23 ENCOUNTER — Telehealth: Payer: Self-pay | Admitting: *Deleted

## 2022-06-23 NOTE — Telephone Encounter (Signed)
Left patient a message to call and schedule appointment with Dr. Para March or another MD before 08/03/2022.

## 2022-07-02 ENCOUNTER — Other Ambulatory Visit: Payer: Self-pay

## 2022-07-02 ENCOUNTER — Ambulatory Visit: Payer: BC Managed Care – PPO | Attending: Obstetrics & Gynecology

## 2022-07-02 DIAGNOSIS — Z01419 Encounter for gynecological examination (general) (routine) without abnormal findings: Secondary | ICD-10-CM | POA: Diagnosis not present

## 2022-07-02 DIAGNOSIS — R279 Unspecified lack of coordination: Secondary | ICD-10-CM | POA: Diagnosis present

## 2022-07-02 DIAGNOSIS — M6281 Muscle weakness (generalized): Secondary | ICD-10-CM | POA: Insufficient documentation

## 2022-07-02 NOTE — Therapy (Signed)
OUTPATIENT PHYSICAL THERAPY FEMALE PELVIC EVALUATION   Patient Name: Melinda Lopez MRN: [id number] DOB:1960-10-01, 62 y.o., female Today's Date: 07/02/2022  END OF SESSION:  PT End of Session - 07/02/22 1404     Visit Number 1    Date for PT Re-Evaluation 12/17/22    Authorization Type BCBS    PT Start Time 1401    PT Stop Time 1440    PT Time Calculation (min) 39 min    Activity Tolerance Patient tolerated treatment well    Behavior During Therapy [facility] for tasks assessed/performed             Past Medical History:  Diagnosis Date   Carotid artery stenosis    Chronic headaches    Fatty liver 04/07/2016   Glaucoma    Hx of adenomatous polyp of colon 07/30/2011   Hx of blood clots    fingers   Hyperlipemia    Hypertension    Hypothyroidism    Migraine 1982   pt states migraines since then.    PFO (patent foramen ovale)    TIA (transient ischemic attack)    Past Surgical History:  Procedure Laterality Date   BREAST LUMPECTOMY Right 2000   CAROTID ARTERY - SUBCLAVIAN ARTERY BYPASS GRAFT Right 2006   COLONOSCOPY     UPPER GASTROINTESTINAL ENDOSCOPY     Patient Active Problem List   Diagnosis Date Noted   Pelvic pain 06/10/2022   GERD (gastroesophageal reflux disease) 06/10/2022   Estrogen deficiency 08/01/2021   Vitamin D deficiency 08/01/2021   Nondisplaced fracture of medial malleolus of left tibia, initial encounter for closed fracture 07/22/2021   Acute left ankle pain 07/21/2021   Glaucoma 04/29/2021   Low back pain 07/26/2020   Essential hypertension 07/26/2020   Arthritis of metatarsophalangeal (MTP) joint of great toe 07/20/2018   Lupus anticoagulant disorder (HCC) 06/16/2017   Carotid stenosis, asymptomatic, bilateral 09/24/2016   H/O carotid endarterectomy 08/26/2016   TIA (transient ischemic attack) 08/26/2016   Coagulation disorder (HCC) 08/26/2016   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   Greater trochanteric bursitis of right hip 02/28/2015    Patellofemoral syndrome 02/28/2015   Long term current use of anticoagulant therapy 02/25/2015   Hypercoagulable state (HCC) 02/25/2015   Fatty liver 05/29/2014   Preventative health care 04/12/2014   History of blood clots 03/15/2014   Patent foramen ovale 03/15/2014   History of carotid artery stenosis 03/15/2014   Migraine without status migrainosus, not intractable 03/15/2014   Hypothyroidism 03/15/2014   Hyperlipidemia 03/15/2014   History of cardiovascular disorder 03/15/2014   Hx of adenomatous polyp of colon 07/30/2011    PCP: [name], NP  REFERRING PROVIDER: [name], NP  REFERRING DIAG: R10.2 (ICD-10-CM) - Pelvic pain  THERAPY DIAG:  Muscle weakness (generalized)  Unspecified lack of coordination  Rationale for Evaluation and Treatment: Rehabilitation  ONSET DATE: 10/03/2020  SUBJECTIVE:  EVAL SUBJECTIVE STATEMENT: Pt states that she is getting fit for pessary so she has it for support, but she is still having issues with vaginal wall weakness. She will feel heaviness in low back, hips, and pelvis. Fluid intake: Yes: 5 glasses throughout the day    PAIN:  Are you having pain? Yes NPRS scale: 5/10 Pain location:  Rt hip  Pain type: heaviness Pain description: intermittent   Aggravating factors: walking  Relieving factors: rest  PRECAUTIONS: None  WEIGHT BEARING RESTRICTIONS: No  FALLS:  Has patient fallen in last 6 months? No  LIVING ENVIRONMENT: Lives with: lives with their family Lives in: House/apartment   OCCUPATION: [employer]  PLOF: Independent  PATIENT GOALS: strengthen vaginal walls   PERTINENT HISTORY:  2 vaginal deliveries (2 episiotomies); tubal ligation; migraines  Sexual abuse: No  BOWEL MOVEMENT: Pain  with bowel movement: No Type of bowel movement:Frequency 1x/day and Strain Yes Fully empty rectum: Yes: - Leakage: No Pads: No Fiber supplement: No  URINATION: Pain with urination: No Fully empty bladder: Yes: - Stream: Strong Urgency: No Frequency: every 2 hours Leakage:  None Pads: No  INTERCOURSE: Pain with intercourse:  No pain Ability to have vaginal penetration:  Yes: - Climax: WNL   PREGNANCY: Vaginal deliveries 2 Tearing Yes: 2 episiotomies C-section deliveries 0 Currently pregnant No  PROLAPSE: She will notice bulge in the vagina    OBJECTIVE:  06/16/22:  COGNITION: Overall cognitive status: Within functional limits for tasks assessed     SENSATION: Light touch: Appears intact Proprioception: Appears intact  MUSCLE LENGTH:   FUNCTIONAL TESTS:    GAIT: Comments: WNL  POSTURE: rounded shoulders, forward head, decreased thoracic kyphosis, and posterior pelvic tilt  PALPATION:   General  No abdominal tenderness                External Perineal Exam some atrophy, labial fusion/dryness                             Internal Pelvic Floor no tenderness  Patient confirms identification and approves PT to assess internal pelvic floor and treatment Yes  PELVIC MMT:   MMT eval  Vaginal 1/5, 3 second endurance, 6 repeat contractions  Internal Anal Sphincter   External Anal Sphincter   Puborectalis   Diastasis Recti 2-2.5 finger width separation, difficulty with CUT, abdominal distortion with increased pressure  (Blank rows = not tested)        TONE: WNL-low  PROLAPSE: Grade 3 anterior vaginal wall laxity  TODAY'S TREATMENT:                                                                                                                              DATE:  06/17/22  EVAL  Neuromuscular re-education: Pelvic floor contraction training Quick flicks Long holds Therapeutic activities: Squatty potty Relaxed toilet mechanics Pressure  management Balloon pushing with bowel movement  PATIENT EDUCATION:  Education details: See above Person educated: Patient Education method: Explanation, Demonstration, Tactile cues, Verbal cues, and Handouts Education comprehension: verbalized understanding  HOME EXERCISE PROGRAM: [id number]  ASSESSMENT:  CLINICAL IMPRESSION: Patient is a 62 y.o. female who was seen today for physical therapy evaluation and treatment for vaginal wall laxity/vaginal bulge. Exam findings notable for grade 3 anterior vaginal wall laxity, core weakness with distortion upon increased abdominal pressure, pelvic floor weakness 1/5, decreased pelvic floor endurance, and difficulty with repeat pelvic floor contractions. Signs and symptoms are most consistent with pelvic floor muscle weakness and difficulty with appropriate facilitation of pelvic floor contraction; she also has tendency to bear down with functional activities, including bowel movements, which will continue to exacerbate vaginal wall laxity if not corrected. Initial treatment included improved toilet mechanics, education on importance of better pressure management, and pelvic floor contraction training. She will continue to benefit from skilled PT intervention in order to reduce vaginal wall laxity, improve pelvic floor strength and coordination, and begin/progress functional core strengthening program.    OBJECTIVE IMPAIRMENTS: decreased activity tolerance, decreased coordination, decreased endurance, decreased strength, increased fascial restrictions, increased muscle spasms, impaired tone, postural dysfunction, and pain.   ACTIVITY LIMITATIONS: carrying, bending, standing, stairs, transfers, and locomotion level  PARTICIPATION LIMITATIONS: community activity and exercise  PERSONAL FACTORS: 1 comorbidity: medical history  are also affecting patient's functional outcome.   REHAB POTENTIAL: Good  CLINICAL DECISION MAKING:  Stable/uncomplicated  EVALUATION COMPLEXITY: Low   GOALS: Goals reviewed with patient? Yes  SHORT TERM GOALS: Target date: 08/06/22  Pt will be independent with HEP.   Baseline: Goal status: INITIAL  2.  Pt will be able to correctly perform diaphragmatic breathing and appropriate pressure management in order to prevent worsening vaginal wall laxity and improve pelvic floor A/ROM.   Baseline:  Goal status: INITIAL  3.  Pt will be independent with use of squatty potty, relaxed toileting mechanics, and improved bowel movement techniques in order to increase ease of bowel movements and complete evacuation.   Baseline:  Goal status: INITIAL    LONG TERM GOALS: Target date: 12/17/22  Pt will be independent with advanced HEP.   Baseline:  Goal status: INITIAL  2.  Pt will demonstrate normal pelvic floor muscle tone and A/ROM, able to achieve 3/5 strength with contractions and 10 sec endurance, in order to provide appropriate lumbopelvic support in functional activities.   Baseline:  Goal status: INITIAL  3.  Pt will be able to ascend steps without feeling the need to bear down, appropriately managing intra-abdominal pressure appropriately.  Baseline:  Goal status: INITIAL  4.  Pt will report bil hip pain/pelvic pressure no greater than 2/10 with any activity. Baseline:  Goal status: INITIAL  5.  Pt will be able to walk for greater than 30 minutes without sensation of pelvic pressure/vaginal bulge.  Baseline:  Goal status: INITIAL   PLAN:  PT FREQUENCY: 1-2x/week  PT DURATION: 6 months  PLANNED INTERVENTIONS: Therapeutic exercises, Therapeutic activity, Neuromuscular re-education, Balance training, Gait training, Patient/Family education, Self Care, Joint mobilization, Dry Needling, Biofeedback, and Manual therapy  PLAN FOR NEXT SESSION: Begin core training and progressions; hip strengthening; mobility exercises/stretches.    Julio Alm, PT, DPT05/30/245:16  PM

## 2022-07-02 NOTE — Patient Instructions (Addendum)
Squatty potty: When your knees are level or below the level of your hips, pelvic floor muscles are pressed against rectum, preventing ease of bowel movement. By getting knees above the level of the hips, these pelvic floor muscles relax, allowing easier passage of bowel movement. ? Ways to get knees above hips: o Squatty Potty (7inch and 9inch versions) o Small stool o Roll of toilet paper under each foot o Hardback book or stack of magazines under each foot  Relaxed Toileting mechanics: Once in this position, make sure to lean forward with forearms on thighs, wide knees, relaxed stomach, and breathe.    *Balloon breathing when trying to have a bowel movement   *Exhale as the step up to make sure you are not bearing down  [facility] [address], [address] [place], [place] [address] Phone # [phone] Fax [phone]

## 2022-07-04 ENCOUNTER — Other Ambulatory Visit: Payer: Self-pay | Admitting: Family

## 2022-07-04 DIAGNOSIS — E039 Hypothyroidism, unspecified: Secondary | ICD-10-CM

## 2022-07-08 ENCOUNTER — Ambulatory Visit: Payer: BC Managed Care – PPO | Attending: Obstetrics & Gynecology

## 2022-07-08 DIAGNOSIS — R279 Unspecified lack of coordination: Secondary | ICD-10-CM | POA: Diagnosis present

## 2022-07-08 DIAGNOSIS — M6281 Muscle weakness (generalized): Secondary | ICD-10-CM | POA: Insufficient documentation

## 2022-07-08 NOTE — Therapy (Signed)
OUTPATIENT PHYSICAL THERAPY FEMALE PELVIC TREATMENT   Patient Name: Melinda Lopez MRN: [id number] DOB:[birth year], 62 y.o., female Today's Date: 07/08/2022  END OF SESSION:  PT End of Session - 07/08/22 0933     Visit Number 2    Date for PT Re-Evaluation 12/17/22    Authorization Type BCBS    PT Start Time [phone]    PT Stop Time 1005    PT Time Calculation (min) 34 min    Activity Tolerance Patient tolerated treatment well    Behavior During Therapy [employer] for tasks assessed/performed              Past Medical History:  Diagnosis Date   Carotid artery stenosis    Chronic headaches    Fatty liver 04/07/2016   Glaucoma    Hx of adenomatous polyp of colon 07/30/2011   Hx of blood clots    fingers   Hyperlipemia    Hypertension    Hypothyroidism    Migraine 1982   pt states migraines since then.    PFO (patent foramen ovale)    TIA (transient ischemic attack)    Past Surgical History:  Procedure Laterality Date   BREAST LUMPECTOMY Right 2000   CAROTID ARTERY - SUBCLAVIAN ARTERY BYPASS GRAFT Right 2006   COLONOSCOPY     UPPER GASTROINTESTINAL ENDOSCOPY     Patient Active Problem List   Diagnosis Date Noted   Pelvic pain 06/10/2022   GERD (gastroesophageal reflux disease) 06/10/2022   Estrogen deficiency 08/01/2021   Vitamin D deficiency 08/01/2021   Nondisplaced fracture of medial malleolus of left tibia, initial encounter for closed fracture 07/22/2021   Acute left ankle pain 07/21/2021   Glaucoma 04/29/2021   Low back pain 07/26/2020   Essential hypertension 07/26/2020   Arthritis of metatarsophalangeal (MTP) joint of great toe 07/20/2018   Lupus anticoagulant disorder (HCC) 06/16/2017   Carotid stenosis, asymptomatic, bilateral 09/24/2016   H/O carotid endarterectomy 08/26/2016   TIA (transient ischemic attack) 08/26/2016   Coagulation disorder (HCC) 08/26/2016   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   Greater trochanteric bursitis of right hip 02/28/2015    Patellofemoral syndrome 02/28/2015   Long term current use of anticoagulant therapy 02/25/2015   Hypercoagulable state (HCC) 02/25/2015   Fatty liver 05/29/2014   Preventative health care 04/12/2014   History of blood clots 03/15/2014   Patent foramen ovale 03/15/2014   History of carotid artery stenosis 03/15/2014   Migraine without status migrainosus, not intractable 03/15/2014   Hypothyroidism 03/15/2014   Hyperlipidemia 03/15/2014   History of cardiovascular disorder 03/15/2014   Hx of adenomatous polyp of colon 07/30/2011    PCP: [name], NP  REFERRING PROVIDER: [name], NP  REFERRING DIAG: R10.2 (ICD-10-CM) - Pelvic pain  THERAPY DIAG:  Muscle weakness (generalized)  Unspecified lack of coordination  Rationale for Evaluation and Treatment: Rehabilitation  ONSET DATE: 10/03/2020  SUBJECTIVE:  SUBJECTIVE STATEMENT: Pt states that she has been working on exercises and can tell that she is a little sore internally.    PAIN:  Are you having pain? Yes NPRS scale: 5/10 Pain location:  Rt hip  Pain type: heaviness Pain description: intermittent   Aggravating factors: walking  Relieving factors: rest  PRECAUTIONS: None  WEIGHT BEARING RESTRICTIONS: No  FALLS:  Has patient fallen in last 6 months? No  LIVING ENVIRONMENT: Lives with: lives with their family Lives in: House/apartment   OCCUPATION: [employer]  PLOF: Independent  PATIENT GOALS: strengthen vaginal walls   PERTINENT HISTORY:  2 vaginal deliveries (2 episiotomies); tubal ligation; migraines  Sexual abuse: No  BOWEL MOVEMENT: Pain with bowel movement: No Type of bowel movement:Frequency 1x/day and Strain Yes Fully empty rectum: Yes: - Leakage: No Pads: No Fiber  supplement: No  URINATION: Pain with urination: No Fully empty bladder: Yes: - Stream: Strong Urgency: No Frequency: every 2 hours Leakage:  None Pads: No  INTERCOURSE: Pain with intercourse:  No pain Ability to have vaginal penetration:  Yes: - Climax: WNL   PREGNANCY: Vaginal deliveries 2 Tearing Yes: 2 episiotomies C-section deliveries 0 Currently pregnant No  PROLAPSE: She will notice bulge in the vagina    OBJECTIVE:  06/16/22:  COGNITION: Overall cognitive status: Within functional limits for tasks assessed     SENSATION: Light touch: Appears intact Proprioception: Appears intact  MUSCLE LENGTH:   FUNCTIONAL TESTS:    GAIT: Comments: WNL  POSTURE: rounded shoulders, forward head, decreased thoracic kyphosis, and posterior pelvic tilt  PALPATION:   General  No abdominal tenderness                External Perineal Exam some atrophy, labial fusion/dryness                             Internal Pelvic Floor no tenderness  Patient confirms identification and approves PT to assess internal pelvic floor and treatment Yes  PELVIC MMT:   MMT eval  Vaginal 1/5, 3 second endurance, 6 repeat contractions  Internal Anal Sphincter   External Anal Sphincter   Puborectalis   Diastasis Recti 2-2.5 finger width separation, difficulty with CUT, abdominal distortion with increased pressure  (Blank rows = not tested)        TONE: WNL-low  PROLAPSE: Grade 3 anterior vaginal wall laxity  TODAY'S TREATMENT:                                                                                                                              DATE:  07/08/22 Neuromuscular re-education: Pelvic floor contractions in standing: Regular stance Wide stance Staggered stance bil Transversus abdominus training with multimodal cues for improved motor control and breath coordination Bil UE ball press with core contraction 10x Hip adduction with core contraction  10x Exercises: LTR 2 x 10 Therapeutic activities:    06/17/22  EVAL  Neuromuscular re-education: Pelvic floor contraction training Quick flicks Long holds Therapeutic activities: Squatty potty Relaxed toilet mechanics Pressure management Balloon pushing with bowel movement    PATIENT EDUCATION:  Education details: See above Person educated: Patient Education method: Explanation, Demonstration, Tactile cues, Verbal cues, and Handouts Education comprehension: verbalized understanding  HOME EXERCISE PROGRAM: [id number]  ASSESSMENT:  CLINICAL IMPRESSION: Pt did well initial HEP, but was encouraged to perform more regularly. She was bale to progress pelvic floor strengthening to various standing positions; she stated that she was able to feel contractions, but not pull up quite as far. Core training performed and she was able to progress into other exercises with combination of pelvic floor contractions. She initially had difficulty with appropriate core activation strategy, using increased abdominal pressure to activate core. She did well with correcting this and was able to feel the change. She had some lower abdominal cramping with exercises today; mobility activities included to help reduce this. She will continue to benefit from skilled PT intervention in order to reduce vaginal wall laxity, improve pelvic floor strength and coordination, and begin/progress functional core strengthening program.    OBJECTIVE IMPAIRMENTS: decreased activity tolerance, decreased coordination, decreased endurance, decreased strength, increased fascial restrictions, increased muscle spasms, impaired tone, postural dysfunction, and pain.   ACTIVITY LIMITATIONS: carrying, bending, standing, stairs, transfers, and locomotion level  PARTICIPATION LIMITATIONS: community activity and exercise  PERSONAL FACTORS: 1 comorbidity: medical history  are also affecting patient's functional outcome.   REHAB  POTENTIAL: Good  CLINICAL DECISION MAKING: Stable/uncomplicated  EVALUATION COMPLEXITY: Low   GOALS: Goals reviewed with patient? Yes  SHORT TERM GOALS: Target date: 08/06/22  Pt will be independent with HEP.   Baseline: Goal status: INITIAL  2.  Pt will be able to correctly perform diaphragmatic breathing and appropriate pressure management in order to prevent worsening vaginal wall laxity and improve pelvic floor A/ROM.   Baseline:  Goal status: INITIAL  3.  Pt will be independent with use of squatty potty, relaxed toileting mechanics, and improved bowel movement techniques in order to increase ease of bowel movements and complete evacuation.   Baseline:  Goal status: INITIAL    LONG TERM GOALS: Target date: 12/17/22  Pt will be independent with advanced HEP.   Baseline:  Goal status: INITIAL  2.  Pt will demonstrate normal pelvic floor muscle tone and A/ROM, able to achieve 3/5 strength with contractions and 10 sec endurance, in order to provide appropriate lumbopelvic support in functional activities.   Baseline:  Goal status: INITIAL  3.  Pt will be able to ascend steps without feeling the need to bear down, appropriately managing intra-abdominal pressure appropriately.  Baseline:  Goal status: INITIAL  4.  Pt will report bil hip pain/pelvic pressure no greater than 2/10 with any activity. Baseline:  Goal status: INITIAL  5.  Pt will be able to walk for greater than 30 minutes without sensation of pelvic pressure/vaginal bulge.  Baseline:  Goal status: INITIAL   PLAN:  PT FREQUENCY: 1-2x/week  PT DURATION: 6 months  PLANNED INTERVENTIONS: Therapeutic exercises, Therapeutic activity, Neuromuscular re-education, Balance training, Gait training, Patient/Family education, Self Care, Joint mobilization, Dry Needling, Biofeedback, and Manual therapy  PLAN FOR NEXT SESSION: Progress core/pelvic floor strengthening to tolerance; mobility activities.     Julio Alm, PT, DPT06/06/2408:10 AM

## 2022-07-09 ENCOUNTER — Ambulatory Visit (HOSPITAL_BASED_OUTPATIENT_CLINIC_OR_DEPARTMENT_OTHER)
Admission: RE | Admit: 2022-07-09 | Discharge: 2022-07-09 | Disposition: A | Discharge status: Home / Self Care | Payer: BC Managed Care – PPO | Source: Ambulatory Visit | Attending: Family | Admitting: Family

## 2022-07-09 ENCOUNTER — Encounter: Payer: Self-pay | Admitting: Family Medicine

## 2022-07-09 ENCOUNTER — Ambulatory Visit (INDEPENDENT_AMBULATORY_CARE_PROVIDER_SITE_OTHER): Payer: BC Managed Care – PPO | Admitting: Family Medicine

## 2022-07-09 ENCOUNTER — Ambulatory Visit: Payer: BC Managed Care – PPO | Admitting: Family

## 2022-07-09 VITALS — BP 140/65 | HR 65 | Temp 98.3°F | Ht 67.0 in | Wt 198.0 lb

## 2022-07-09 DIAGNOSIS — J0140 Acute pansinusitis, unspecified: Secondary | ICD-10-CM

## 2022-07-09 DIAGNOSIS — R102 Pelvic and perineal pain: Secondary | ICD-10-CM

## 2022-07-09 MED ORDER — BENZONATATE 200 MG PO CAPS
200.0000 mg | ORAL_CAPSULE | Freq: Two times a day (BID) | ORAL | 0 refills | Status: DC | PRN
Start: 2022-07-09 — End: 2022-07-27

## 2022-07-09 MED ORDER — GUAIFENESIN ER 600 MG PO TB12
1200.0000 mg | ORAL_TABLET | Freq: Two times a day (BID) | ORAL | 3 refills | Status: DC
Start: 2022-07-09 — End: 2022-07-27

## 2022-07-09 MED ORDER — AMOXICILLIN-POT CLAVULANATE 875-125 MG PO TABS
1.0000 | ORAL_TABLET | Freq: Two times a day (BID) | ORAL | 0 refills | Status: DC
Start: 2022-07-09 — End: 2022-07-27

## 2022-07-09 NOTE — Patient Instructions (Signed)
Sending in Augmentin, cough pearls, and mucinex. Continue Flonase. Continue supportive measures including rest, hydration, humidifier use, steam showers, warm compresses to sinuses, warm liquids with lemon and honey, and over-the-counter cough, cold, and analgesics as needed.  BP mildly elevated - please monitor at home and send [place] a few days worth of readings next week.

## 2022-07-09 NOTE — Progress Notes (Signed)
Acute Office Visit  Subjective:     Patient ID: Melinda Lopez, female    DOB: [birth year], 62 y.o.   MRN: [id number]  Chief Complaint  Patient presents with   Sore Throat   Cough     Discussed the use of AI scribe software for clinical note transcription with the patient, who gave verbal consent to proceed.  History of Present Illness   The patient, with a history of sinus infections, presents with worsening upper respiratory symptoms since Friday. Initially, she felt exhausted and unwell, but by Friday, she developed a productive cough with greenish-yellow sputum, fever, and significant malaise. She has tried over-the-counter remedies and prescription Flonase without relief. She also reports a sore throat, headache, facial pain, and earache in both ears. She denies chest pain but feels a bit winded, which she attributes to wearing a mask. She has had one day of diarrhea and has lost her appetite, possibly due to postnasal drainage. She has not been around anyone sick but recently traveled by plane. She tested negative for COVID-19.    Ruptured vessel in right eye a few days ago from coughing - improving, but not fully resoled. No pain         All review of systems negative except what is listed in the HPI      Objective:    BP (!) 140/65   Pulse 65   Temp 98.3 F (36.8 C) (Oral)   Ht 5\' 7"  (1.702 m)   Wt 198 lb (89.8 kg)   SpO2 97%   BMI 31.01 kg/m    Physical Exam Vitals reviewed.  Constitutional:      Appearance: Normal appearance.  HENT:     Head: Normocephalic and atraumatic.     Right Ear: Tympanic membrane normal.     Left Ear: Tympanic membrane normal.     Nose: Congestion present.  Eyes:     Comments: Right eye with mildly discolored sclera; reports recent hemorrhage with coughing; asymptomatic   Cardiovascular:     Rate and Rhythm: Normal rate and regular rhythm.     Pulses: Normal pulses.     Heart sounds: Normal heart sounds.  Pulmonary:      Effort: Pulmonary effort is normal.     Breath sounds: Normal breath sounds. No wheezing, rhonchi or rales.  Musculoskeletal:     Cervical back: Normal range of motion and neck supple.  Skin:    General: Skin is warm and dry.  Neurological:     Mental Status: She is alert and oriented to person, place, and time.  Psychiatric:        Mood and Affect: Mood normal.        Behavior: Behavior normal.        Thought Content: Thought content normal.        Judgment: Judgment normal.     No results found for any visits on 07/09/22.      Assessment & Plan:   Problem List Items Addressed This Visit   None Visit Diagnoses     Acute non-recurrent pansinusitis    -  Primary   Relevant Medications   amoxicillin-clavulanate (AUGMENTIN) 875-125 MG tablet   guaiFENesin (MUCINEX) 600 MG 12 hr tablet   benzonatate (TESSALON) 200 MG capsule     Sending in Augmentin, cough pearls, and mucinex. Continue Flonase. Continue supportive measures including rest, hydration, humidifier use, steam showers, warm compresses to sinuses, warm liquids with lemon and honey, and over-the-counter cough, cold,  and analgesics as needed.  BP mildly elevated - please monitor at home and send [place] a few days worth of readings next week.   Patient aware of signs/symptoms requiring further/urgent evaluation.   Meds ordered this encounter  Medications   amoxicillin-clavulanate (AUGMENTIN) 875-125 MG tablet    Sig: Take 1 tablet by mouth 2 (two) times daily.    Dispense:  20 tablet    Refill:  0    Order Specific Question:   Supervising Provider    Answer:   Danise Edge A [[id number]]   guaiFENesin (MUCINEX) 600 MG 12 hr tablet    Sig: Take 2 tablets (1,200 mg total) by mouth 2 (two) times daily.    Dispense:  30 tablet    Refill:  3    Order Specific Question:   Supervising Provider    Answer:   Danise Edge A [[id number]]   benzonatate (TESSALON) 200 MG capsule    Sig: Take 1 capsule (200 mg total) by mouth 2 (two)  times daily as needed for cough.    Dispense:  20 capsule    Refill:  0    Order Specific Question:   Supervising Provider    Answer:   Danise Edge A [[id number]]    Return if symptoms worsen or fail to improve.  Clayborne Dana, NP

## 2022-07-13 ENCOUNTER — Ambulatory Visit: Payer: BC Managed Care – PPO | Admitting: Obstetrics & Gynecology

## 2022-07-16 ENCOUNTER — Encounter: Payer: Self-pay | Admitting: Obstetrics and Gynecology

## 2022-07-16 ENCOUNTER — Ambulatory Visit (INDEPENDENT_AMBULATORY_CARE_PROVIDER_SITE_OTHER): Payer: BC Managed Care – PPO | Admitting: Obstetrics and Gynecology

## 2022-07-16 VITALS — BP 140/83 | HR 71 | Ht 67.0 in | Wt 194.0 lb

## 2022-07-16 DIAGNOSIS — N814 Uterovaginal prolapse, unspecified: Secondary | ICD-10-CM

## 2022-07-16 DIAGNOSIS — Z4689 Encounter for fitting and adjustment of other specified devices: Secondary | ICD-10-CM | POA: Diagnosis not present

## 2022-07-16 NOTE — Progress Notes (Signed)
   RETURN GYNECOLOGY VISIT  Subjective:  Melinda Lopez is a 62 y.o. menopausal G2P2 presenting for pessary fitting.   Mild & occasional bulge symptoms. Would like to have pessary to use prn. See Dr. Lianne Bushy note for details of her prior exam.   Objective:   Vitals:   07/16/22 1441  BP: (!) 140/83  Pulse: 71  Weight: 194 lb (88 kg)  Height: 5\' 7"  (1.702 m)   General:  Alert, oriented and cooperative. Patient is in no acute distress.  Skin: Skin is warm and dry. No rash noted.   Cardiovascular: Normal heart rate noted  Respiratory: Normal respiratory effort, no problems with respiration noted  Abdomen: Soft, non-tender, non-distended   Pelvic: NEFG. Normal appearance of cervix & vagina. Uterus small & mobile, nontender.   Exam performed in the presence of a chaperone  Assessment and Plan:  Melinda Lopez is a 62 y.o. with uterovaginal prolapse  Encounter for fitting and adjustment of pessary Fitted with #2 ring with support. Pt was able to ambulate, sit & lie down comfortably with pessary in place. Demonstrated ability to insert & remove pessary independently.  Counseled that if patient is using infrequently and it is working well, will follow up for annual exam in 1 year. If questions/concerns about pessary or she opts for daily/continuous wear we will follow up within 3 months.   Return in about 1 year (around 07/16/2023) for annual exam or in 3 months if using pessary daily .  Future Appointments  Date Time Provider Department Center  07/21/2022  4:40 PM [facility] MM 2 [facility]  07/22/2022  9:30 AM Julio Alm A, PT [facility] None  07/27/2022  1:00 PM [facility] VASC 3 - EM [facility]  07/27/2022  1:40 PM Nada Libman, MD [facility]  08/20/2022  9:30 AM Marisue Ivan, PT [facility] None  09/01/2022 11:45 AM Marisue Ivan, PT [facility] None  11/02/2022  8:00 AM Kathleene Hazel, MD [facility]  12/14/2022 10:00 AM [name], NP [facility]   Lennart Pall, MD

## 2022-07-17 ENCOUNTER — Other Ambulatory Visit: Payer: Self-pay | Admitting: *Deleted

## 2022-07-17 DIAGNOSIS — I872 Venous insufficiency (chronic) (peripheral): Secondary | ICD-10-CM

## 2022-07-17 DIAGNOSIS — M7989 Other specified soft tissue disorders: Secondary | ICD-10-CM

## 2022-07-18 LAB — PROTIME-INR
INR: 1.5 — ABNORMAL HIGH (ref 0.9–1.2)
Prothrombin Time: 15.6 s — ABNORMAL HIGH (ref 9.1–12.0)

## 2022-07-20 ENCOUNTER — Ambulatory Visit (INDEPENDENT_AMBULATORY_CARE_PROVIDER_SITE_OTHER): Payer: BC Managed Care – PPO

## 2022-07-20 DIAGNOSIS — G459 Transient cerebral ischemic attack, unspecified: Secondary | ICD-10-CM

## 2022-07-20 DIAGNOSIS — D6859 Other primary thrombophilia: Secondary | ICD-10-CM

## 2022-07-20 DIAGNOSIS — Z7901 Long term (current) use of anticoagulants: Secondary | ICD-10-CM

## 2022-07-20 NOTE — Patient Instructions (Signed)
Description   RELEASE THE STANDING ORDER TO HAVE NEXT INR DRAWN. Spoke with pt and advised to take 1 tablet today and 1.5 tablets tomorrow and then continue taking 10mg  daily EXCEPT 5mg  on Monday, Wednesday, and Friday.  Stay consistent with greens each week (4-5 servings per week). Recheck INR on Thursday at [facility].  [facility] [phone]

## 2022-07-21 ENCOUNTER — Ambulatory Visit
Admission: RE | Admit: 2022-07-21 | Discharge: 2022-07-21 | Disposition: A | Discharge status: Home / Self Care | Payer: BC Managed Care – PPO | Source: Ambulatory Visit | Attending: Family | Admitting: Family

## 2022-07-22 ENCOUNTER — Ambulatory Visit: Payer: BC Managed Care – PPO

## 2022-07-22 DIAGNOSIS — M6281 Muscle weakness (generalized): Secondary | ICD-10-CM

## 2022-07-22 DIAGNOSIS — R279 Unspecified lack of coordination: Secondary | ICD-10-CM

## 2022-07-22 NOTE — Therapy (Addendum)
OUTPATIENT PHYSICAL THERAPY FEMALE PELVIC TREATMENT   Patient Name: Melinda Lopez MRN: [id number] DOB:1960-11-25, 62 y.o., female Today's Date: 07/22/2022  END OF SESSION:  PT End of Session - 07/22/22 0942     Visit Number 3    Date for PT Re-Evaluation 12/17/22    Authorization Type BCBS    PT Start Time 0940    PT Stop Time 1015    PT Time Calculation (min) 35 min    Activity Tolerance Patient tolerated treatment well    Behavior During Therapy [facility] for tasks assessed/performed              Past Medical History:  Diagnosis Date   Carotid artery stenosis    Chronic headaches    Fatty liver 04/07/2016   Glaucoma    Hx of adenomatous polyp of colon 07/30/2011   Hx of blood clots    fingers   Hyperlipemia    Hypertension    Hypothyroidism    Migraine 1982   pt states migraines since then.    PFO (patent foramen ovale)    TIA (transient ischemic attack)    Past Surgical History:  Procedure Laterality Date   BREAST LUMPECTOMY Right 2000   CAROTID ARTERY - SUBCLAVIAN ARTERY BYPASS GRAFT Right 2006   COLONOSCOPY     UPPER GASTROINTESTINAL ENDOSCOPY     Patient Active Problem List   Diagnosis Date Noted   Pelvic pain 06/10/2022   GERD (gastroesophageal reflux disease) 06/10/2022   Estrogen deficiency 08/01/2021   Vitamin D deficiency 08/01/2021   Nondisplaced fracture of medial malleolus of left tibia, initial encounter for closed fracture 07/22/2021   Acute left ankle pain 07/21/2021   Glaucoma 04/29/2021   Low back pain 07/26/2020   Essential hypertension 07/26/2020   Arthritis of metatarsophalangeal (MTP) joint of great toe 07/20/2018   Lupus anticoagulant disorder (HCC) 06/16/2017   Carotid stenosis, asymptomatic, bilateral 09/24/2016   H/O carotid endarterectomy 08/26/2016   TIA (transient ischemic attack) 08/26/2016   Coagulation disorder (HCC) 08/26/2016   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   Greater trochanteric bursitis of right hip 02/28/2015    Patellofemoral syndrome 02/28/2015   Long term current use of anticoagulant therapy 02/25/2015   Hypercoagulable state (HCC) 02/25/2015   Fatty liver 05/29/2014   Preventative health care 04/12/2014   History of blood clots 03/15/2014   Patent foramen ovale 03/15/2014   History of carotid artery stenosis 03/15/2014   Migraine without status migrainosus, not intractable 03/15/2014   Hypothyroidism 03/15/2014   Hyperlipidemia 03/15/2014   History of cardiovascular disorder 03/15/2014   Hx of adenomatous polyp of colon 07/30/2011    PCP: [name], NP  REFERRING PROVIDER: [name], NP  REFERRING DIAG: R10.2 (ICD-10-CM) - Pelvic pain  THERAPY DIAG:  Muscle weakness (generalized)  Unspecified lack of coordination  Rationale for Evaluation and Treatment: Rehabilitation  ONSET DATE: 10/03/2020  SUBJECTIVE:  SUBJECTIVE STATEMENT: Pt states that she is overall doing very well. She only feels very mild bulge in vagina and it is not every day. She is able to walk for >30 minutes without symptoms. She has gotten fit or a pessary. This is the last visit that insurance will cover, but she would like to leave chart open and come in at the end of the summer to check in.    PAIN:  Are you having pain? Yes NPRS scale: 0/10 Pain location:  Rt hip  Pain type: heaviness Pain description: intermittent   Aggravating factors: walking  Relieving factors: rest  PRECAUTIONS: None  WEIGHT BEARING RESTRICTIONS: No  FALLS:  Has patient fallen in last 6 months? No  LIVING ENVIRONMENT: Lives with: lives with their family Lives in: House/apartment   OCCUPATION: [employer]  PLOF: Independent  PATIENT GOALS: strengthen vaginal walls   PERTINENT HISTORY:  2 vaginal  deliveries (2 episiotomies); tubal ligation; migraines  Sexual abuse: No  BOWEL MOVEMENT: Pain with bowel movement: No Type of bowel movement:Frequency 1x/day and Strain Yes Fully empty rectum: Yes: - Leakage: No Pads: No Fiber supplement: No  URINATION: Pain with urination: No Fully empty bladder: Yes: - Stream: Strong Urgency: No Frequency: every 2 hours Leakage:  None Pads: No  INTERCOURSE: Pain with intercourse:  No pain Ability to have vaginal penetration:  Yes: - Climax: WNL   PREGNANCY: Vaginal deliveries 2 Tearing Yes: 2 episiotomies C-section deliveries 0 Currently pregnant No  PROLAPSE: She will notice bulge in the vagina    OBJECTIVE:  06/16/22:  COGNITION: Overall cognitive status: Within functional limits for tasks assessed     SENSATION: Light touch: Appears intact Proprioception: Appears intact  MUSCLE LENGTH:   FUNCTIONAL TESTS:    GAIT: Comments: WNL  POSTURE: rounded shoulders, forward head, decreased thoracic kyphosis, and posterior pelvic tilt  PALPATION:   General  No abdominal tenderness                External Perineal Exam some atrophy, labial fusion/dryness                             Internal Pelvic Floor no tenderness  Patient confirms identification and approves PT to assess internal pelvic floor and treatment Yes  PELVIC MMT:   MMT eval  Vaginal 1/5, 3 second endurance, 6 repeat contractions  Internal Anal Sphincter   External Anal Sphincter   Puborectalis   Diastasis Recti 2-2.5 finger width separation, difficulty with CUT, abdominal distortion with increased pressure  (Blank rows = not tested)        TONE: WNL-low  PROLAPSE: Grade 3 anterior vaginal wall laxity  TODAY'S TREATMENT:                                                                                                                              DATE:  07/22/22 Neuromuscular re-education: Supine hip  adduction 10x Supine hip adduction and  bridge 10x Supine march 2 x 10 Seated resisted march yellow loop 2 x 10 Seated hip abduction yellow loop 2 x 10 Therapeutic activities: HEP review   07/08/22 Neuromuscular re-education: Pelvic floor contractions in standing: Regular stance Wide stance Staggered stance bil Transversus abdominus training with multimodal cues for improved motor control and breath coordination Bil UE ball press with core contraction 10x Hip adduction with core contraction 10x Exercises: LTR 2 x 10 Therapeutic activities:    06/17/22  EVAL  Neuromuscular re-education: Pelvic floor contraction training Quick flicks Long holds Therapeutic activities: Squatty potty Relaxed toilet mechanics Pressure management Balloon pushing with bowel movement    PATIENT EDUCATION:  Education details: See above Person educated: Patient Education method: Programmer, multimedia, Facilities manager, Actor cues, Verbal cues, and Handouts Education comprehension: verbalized understanding  HOME EXERCISE PROGRAM: [id number]  ASSESSMENT:  CLINICAL IMPRESSION: Pt states this will be her last visit until end of summer due to insurance not covering them anymore. She would like to go over HEP to make sure she is confident with exercises and understands any progressions. She demonstrates good core activation and breath coordination for pressure management. She has made good progress with decreasing sensation of vaginal bulge. She may benefit from one more session to progress HEP and make sure the she is confident with pressure management strategies.   OBJECTIVE IMPAIRMENTS: decreased activity tolerance, decreased coordination, decreased endurance, decreased strength, increased fascial restrictions, increased muscle spasms, impaired tone, postural dysfunction, and pain.   ACTIVITY LIMITATIONS: carrying, bending, standing, stairs, transfers, and locomotion level  PARTICIPATION LIMITATIONS: community activity and exercise  PERSONAL  FACTORS: 1 comorbidity: medical history  are also affecting patient's functional outcome.   REHAB POTENTIAL: Good  CLINICAL DECISION MAKING: Stable/uncomplicated  EVALUATION COMPLEXITY: Low   GOALS: Goals reviewed with patient? Yes  SHORT TERM GOALS: Target date: 08/06/22 - updated 07/22/22  Pt will be independent with HEP.   Baseline: Goal status: MET 07/22/22  2.  Pt will be able to correctly perform diaphragmatic breathing and appropriate pressure management in order to prevent worsening vaginal wall laxity and improve pelvic floor A/ROM.   Baseline:  Goal status: MET 07/22/22  3.  Pt will be independent with use of squatty potty, relaxed toileting mechanics, and improved bowel movement techniques in order to increase ease of bowel movements and complete evacuation.   Baseline:  Goal status: MET 07/22/22    LONG TERM GOALS: Target date: 12/17/22 - updated 07/22/22  Pt will be independent with advanced HEP.   Baseline:  Goal status: IN PROGRESS  2.  Pt will demonstrate normal pelvic floor muscle tone and A/ROM, able to achieve 3/5 strength with contractions and 10 sec endurance, in order to provide appropriate lumbopelvic support in functional activities.   Baseline:  Goal status:IN PROGRESS  3.  Pt will be able to ascend steps without feeling the need to bear down, appropriately managing intra-abdominal pressure appropriately.  Baseline:  Goal status: MET 07/22/22  4.  Pt will report bil hip pain/pelvic pressure no greater than 2/10 with any activity. Baseline: No more than 1/10 Goal status: MET 07/22/22  5.  Pt will be able to walk for greater than 30 minutes without sensation of pelvic pressure/vaginal bulge.  Baseline:  Goal status: MET 07/22/22   PLAN:  PT FREQUENCY: 1-2x/week  PT DURATION: 6 months  PLANNED INTERVENTIONS: Therapeutic exercises, Therapeutic activity, Neuromuscular re-education, Balance training, Gait training, Patient/Family education, Self  Care, Joint mobilization, Dry Needling,  Biofeedback, and Manual therapy  PLAN FOR NEXT SESSION: Plan to progress HEP; D/C   Julio Alm, PT, DPT06/19/2410:26 AM  PHYSICAL THERAPY DISCHARGE SUMMARY  Visits from Start of Care: 3  Current functional level related to goals / functional outcomes: Unknown   Remaining deficits: See above   Education / Equipment: HEP   Patient agrees to discharge. Patient goals were partially met. Patient is being discharged due to being pleased with the current functional level.  Julio Alm, PT, DPT02/04/259:12 AM

## 2022-07-23 ENCOUNTER — Telehealth: Payer: Self-pay | Admitting: *Deleted

## 2022-07-23 NOTE — Telephone Encounter (Signed)
Called pt since INR is due; there was no answer therefore left a message. Will await a call back and follow up.  

## 2022-07-24 ENCOUNTER — Telehealth: Payer: Self-pay

## 2022-07-24 ENCOUNTER — Ambulatory Visit (INDEPENDENT_AMBULATORY_CARE_PROVIDER_SITE_OTHER): Payer: BC Managed Care – PPO | Admitting: *Deleted

## 2022-07-24 ENCOUNTER — Other Ambulatory Visit: Payer: Self-pay

## 2022-07-24 DIAGNOSIS — G459 Transient cerebral ischemic attack, unspecified: Secondary | ICD-10-CM

## 2022-07-24 DIAGNOSIS — D6859 Other primary thrombophilia: Secondary | ICD-10-CM

## 2022-07-24 DIAGNOSIS — Z7901 Long term (current) use of anticoagulants: Secondary | ICD-10-CM

## 2022-07-24 LAB — PROTIME-INR
INR: 2.2 — ABNORMAL HIGH (ref 0.9–1.2)
Prothrombin Time: 22.4 s — ABNORMAL HIGH (ref 9.1–12.0)

## 2022-07-24 NOTE — Telephone Encounter (Signed)
Lpmtcb and discuss INR result. °

## 2022-07-27 ENCOUNTER — Ambulatory Visit (INDEPENDENT_AMBULATORY_CARE_PROVIDER_SITE_OTHER): Payer: BC Managed Care – PPO | Admitting: Surgery

## 2022-07-27 ENCOUNTER — Ambulatory Visit (HOSPITAL_COMMUNITY)
Admission: RE | Admit: 2022-07-27 | Discharge: 2022-07-27 | Disposition: A | Discharge status: Home / Self Care | Payer: BC Managed Care – PPO | Source: Ambulatory Visit | Attending: Surgery | Admitting: Surgery

## 2022-07-27 ENCOUNTER — Encounter: Payer: Self-pay | Admitting: Surgery

## 2022-07-27 VITALS — BP 151/81 | HR 54 | Temp 97.8°F | Resp 20 | Ht 67.0 in | Wt 194.0 lb

## 2022-07-27 DIAGNOSIS — M7989 Other specified soft tissue disorders: Secondary | ICD-10-CM | POA: Insufficient documentation

## 2022-07-27 DIAGNOSIS — I872 Venous insufficiency (chronic) (peripheral): Secondary | ICD-10-CM | POA: Diagnosis present

## 2022-07-27 NOTE — Progress Notes (Signed)
Vascular and Vein Specialist of [facility]  Patient name: Melinda Lopez MRN: [id number] DOB: [birth year] Sex: female   REASON FOR VISIT:    Follow up vein  HISOTRY OF PRESENT ILLNESS:    Melinda Lopez is a 62 y.o. female who was evaluated in the PA neck on 05/14/2022 for bilateral lower extremity swelling.  This has been going on for several months to a year.  She experiences heaviness and fatigue throughout the course of the day that is worse at the end of the day.  She has not had any prior DVT or venous ulcerations.  She denies any lower extremity trauma.  She tries to avoid prolonged sitting and standing.  Compression socks did help with her swelling.  Patient is a history of carotid artery occlusive disease and is status post right carotid endarterectomy in [place] by Dr. Mayford Knife in 2013.  She is a former smoker.  She is medically managed for hypertension and takes a statin for hypercholesterolemia.  She is also on Coumadin   PAST MEDICAL HISTORY:   Past Medical History:  Diagnosis Date   Carotid artery stenosis    Chronic headaches    Fatty liver 04/07/2016   Glaucoma    Hx of adenomatous polyp of colon 07/30/2011   Hx of blood clots    fingers   Hyperlipemia    Hypertension    Hypothyroidism    Migraine 1982   pt states migraines since then.    PFO (patent foramen ovale)    TIA (transient ischemic attack)      FAMILY HISTORY:   Family History  Problem Relation Age of Onset   Heart disease Mother        Vascular disease early 46s   Kidney disease Mother    Heart disease Father    Diabetes Sister    Kidney disease Sister    Heart murmur Brother    Heart disease Maternal Grandmother     SOCIAL HISTORY:   Social History   Tobacco Use   Smoking status: Former    Types: Cigarettes    Quit date: 07/29/2002    Years since quitting: 20.0   Smokeless tobacco: Never  Substance Use Topics   Alcohol use: Yes     Comment: 4 a week     ALLERGIES:   Allergies  Allergen Reactions   Shellfish Allergy Anaphylaxis   Codeine Nausea Only   Morphine Other (See Comments)    Other reaction(s): Other (See Comments), slurring words, slurring words   Morphine And Codeine Other (See Comments)    slurring words   Phenergan [Promethazine] Other (See Comments)    Causes blood presser to bottom out   Sulfa Antibiotics Rash and Nausea And Vomiting    Nausea     CURRENT MEDICATIONS:   Current Outpatient Medications  Medication Sig Dispense Refill   ALPRAZolam (XANAX) 0.5 MG tablet Take 1/2-1 tablet by mouth prior to flying. 20 tablet 0   amLODipine (NORVASC) 5 MG tablet TAKE 1 TABLET (5 MG TOTAL) BY MOUTH DAILY. 90 tablet 1   aspirin EC 81 MG tablet Take 81 mg by mouth daily. Swallow whole.     atorvastatin (LIPITOR) 20 MG tablet TAKE 1 TABLET BY MOUTH EVERY DAY 90 tablet 1   cholecalciferol (VITAMIN D3) 25 MCG (1000 UNIT) tablet Take 3,000 Units by mouth daily.     Cyanocobalamin (VITAMIN B-12 PO) Take 1 tablet by mouth daily.     EPINEPHRINE 0.3 mg/0.3 mL  IJ SOAJ injection Inject 0.3 mg into the muscle as needed for anaphylaxis. Use as needed for severe allergic reaction 2 each 2   erythromycin ophthalmic ointment Place 1 Application into both eyes 3 (three) times daily. 3.5 g 0   fluticasone (FLONASE) 50 MCG/ACT nasal spray PLACE 1 SPRAY INTO BOTH NOSTRILS 2 (TWO) TIMES DAILY 48 mL 2   levothyroxine (SYNTHROID) 100 MCG tablet TAKE 1 TABLET BY MOUTH EVERY DAY DUE 06/21/21 90 tablet 1   omeprazole (PRILOSEC) 40 MG capsule TAKE 1 CAPSULE (40 MG TOTAL) BY MOUTH DAILY. 90 capsule 1   SUMAtriptan (IMITREX) 50 MG tablet May repeat in 2 hours if headache persists or recurs. 10 tablet 2   triamcinolone cream (KENALOG) 0.1 % APPLY 1 APPLICATION. TOPICALLY 2 (TWO) TIMES DAILY. 30 g 0   warfarin (COUMADIN) 10 MG tablet Take 10 mg by mouth daily.     Current Facility-Administered Medications  Medication Dose Route  Frequency Provider Last Rate Last Admin   0.9 %  sodium chloride infusion   Intravenous PRN Bhagat, Bhavinkumar, PA        REVIEW OF SYSTEMS:   [X]  denotes positive finding, [ ]  denotes negative finding Cardiac  Comments:  Chest pain or chest pressure:    Shortness of breath upon exertion:    Short of breath when lying flat:    Irregular heart rhythm:        Vascular    Pain in calf, thigh, or hip brought on by ambulation:    Pain in feet at night that wakes you up from your sleep:     Blood clot in your veins:    Leg swelling:  x       Pulmonary    Oxygen at home:    Productive cough:     Wheezing:         Neurologic    Sudden weakness in arms or legs:     Sudden numbness in arms or legs:     Sudden onset of difficulty speaking or slurred speech:    Temporary loss of vision in one eye:     Problems with dizziness:         Gastrointestinal    Blood in stool:     Vomited blood:         Genitourinary    Burning when urinating:     Blood in urine:        Psychiatric    Major depression:         Hematologic    Bleeding problems:    Problems with blood clotting too easily:        Skin    Rashes or ulcers:        Constitutional    Fever or chills:      PHYSICAL EXAM:   Vitals:   07/27/22 1335  BP: (!) 151/81  Pulse: (!) 54  Resp: 20  Temp: 97.8 F (36.6 C)  SpO2: 95%  Weight: 194 lb (88 kg)  Height: 5\' 7"  (1.702 m)    GENERAL: The patient is a well-nourished female, in no acute distress. The vital signs are documented above. CARDIAC: There is a regular rate and rhythm.  VASCULAR: SonoSite was used to evaluate bilateral saphenous veins which appear to be small in caliber.  Today the right measure about 2-3 mm.  The left was around 3 PULMONARY: Non-labored respirations MUSCULOSKELETAL: There are no major deformities or cyanosis. NEUROLOGIC: No focal weakness or paresthesias are detected.  SKIN: There are no ulcers or rashes noted. PSYCHIATRIC: The  patient has a normal affect.  STUDIES:   I have reviewed the following duplex:  LEFT          Reflux NoRefluxReflux TimeDiameter cmsComments                          Yes                                   +--------------+---------+------+-----------+------------+--------+  CFV          no                                              +--------------+---------+------+-----------+------------+--------+  FV mid        no                                              +--------------+---------+------+-----------+------------+--------+  Popliteal    no                                              +--------------+---------+------+-----------+------------+--------+  GSV at [facility]    no                            0.49              +--------------+---------+------+-----------+------------+--------+  GSV prox thighno                            0.41              +--------------+---------+------+-----------+------------+--------+  GSV mid thigh no                            0.30              +--------------+---------+------+-----------+------------+--------+  GSV dist thighno                            0.39              +--------------+---------+------+-----------+------------+--------+  GSV at knee   no                            0.44              +--------------+---------+------+-----------+------------+--------+  GSV prox calf no                            0.40              +--------------+---------+------+-----------+------------+--------+  SSV Pop Fossa no                            0.15              +--------------+---------+------+-----------+------------+--------+  RIGHT         Reflux NoRefluxReflux TimeDiameter cmsComments                          Yes                                   +--------------+---------+------+-----------+------------+--------+  CFV          no                                               +--------------+---------+------+-----------+------------+--------+  FV prox       no                                              +--------------+---------+------+-----------+------------+--------+  FV mid        no                                              +--------------+---------+------+-----------+------------+--------+  FV dist       no                                              +--------------+---------+------+-----------+------------+--------+  GSV at SFJ              yes    >500 ms      0.65              +--------------+---------+------+-----------+------------+--------+  GSV prox thigh          yes    >500 ms      0.61              +--------------+---------+------+-----------+------------+--------+  GSV mid thigh           yes    >500 ms      0.40              +--------------+---------+------+-----------+------------+--------+  GSV dist thigh          yes    >500 ms      0.44              +--------------+---------+------+-----------+------------+--------+  GSV at knee   no                            0.44              +--------------+---------+------+-----------+------------+--------+  GSV prox calf           yes    >500 ms      0.38              +--------------+---------+------+-----------+------------+--------+  SSV Pop Fossa no                            0.24              +--------------+---------+------+-----------+------------+--------+  SSV prox calf           yes    >500 ms      0.3               +--------------+---------+------+-----------+------------+--------+   MEDICAL ISSUES:   Chronic venous insufficiency: The patient did not have reflux on her left leg.  On the right side, she did have reflux but the vein was small in caliber which was confirmed today with SonoSite.  I discussed with her that I do not think that laser ablation would significantly improve her  leg swelling.  I think there is a component of lymphedema.  I think the best course of action is leg elevation and compression stockings.  She knows to contact me should the swelling change and we can repeat her workup.  Carotid: The patient is scheduled to have repeat carotid duplex in 2 years    Charlena Cross, MD, FACS [facility] [phone] Pager [phone]

## 2022-07-29 ENCOUNTER — Other Ambulatory Visit: Payer: Self-pay | Admitting: Family

## 2022-08-14 ENCOUNTER — Telehealth: Payer: Self-pay

## 2022-08-14 NOTE — Telephone Encounter (Signed)
INR overdue. Called, no answer. Left message on voicemail.

## 2022-08-18 ENCOUNTER — Telehealth: Payer: Self-pay | Admitting: *Deleted

## 2022-08-18 NOTE — Telephone Encounter (Signed)
Spoke with pt regarding her INR testing being overdue. She states she has been sick and then she had to go out of town for work. She states she will return on 08/21/22 from [place] and have it done. She is unsure of where to go for a lab in [place] since she is unfamiliar with area. Will await and follow up Friday afternoon.

## 2022-08-20 ENCOUNTER — Ambulatory Visit: Payer: BC Managed Care – PPO

## 2022-08-22 LAB — PROTIME-INR
INR: 2.8 — ABNORMAL HIGH (ref 0.9–1.2)
Prothrombin Time: 28.2 s — ABNORMAL HIGH (ref 9.1–12.0)

## 2022-08-24 ENCOUNTER — Other Ambulatory Visit: Payer: Self-pay

## 2022-08-24 ENCOUNTER — Ambulatory Visit (INDEPENDENT_AMBULATORY_CARE_PROVIDER_SITE_OTHER): Payer: Self-pay | Admitting: *Deleted

## 2022-08-24 DIAGNOSIS — Z7901 Long term (current) use of anticoagulants: Secondary | ICD-10-CM

## 2022-08-24 DIAGNOSIS — G459 Transient cerebral ischemic attack, unspecified: Secondary | ICD-10-CM | POA: Diagnosis not present

## 2022-08-24 DIAGNOSIS — D6859 Other primary thrombophilia: Secondary | ICD-10-CM | POA: Diagnosis not present

## 2022-09-01 ENCOUNTER — Other Ambulatory Visit: Payer: Self-pay | Admitting: Family

## 2022-09-01 DIAGNOSIS — G43909 Migraine, unspecified, not intractable, without status migrainosus: Secondary | ICD-10-CM

## 2022-09-10 ENCOUNTER — Telehealth: Payer: Self-pay | Admitting: *Deleted

## 2022-09-10 NOTE — Telephone Encounter (Signed)
Called pt since she is due to go to the lab today. There was no answer so left her a message to call back to update if she can go today or tomorrow. Will follow up.

## 2022-09-11 NOTE — Telephone Encounter (Signed)
Pt returned call and stated she went to lab this morning to have PT/INR drawn. Will await for results.

## 2022-09-11 NOTE — Telephone Encounter (Signed)
Called pt since INR was due yesterday. No answer. Left message on voicemail.

## 2022-09-14 ENCOUNTER — Ambulatory Visit (INDEPENDENT_AMBULATORY_CARE_PROVIDER_SITE_OTHER): Payer: BC Managed Care – PPO

## 2022-09-14 DIAGNOSIS — D6859 Other primary thrombophilia: Secondary | ICD-10-CM

## 2022-09-14 DIAGNOSIS — Z7901 Long term (current) use of anticoagulants: Secondary | ICD-10-CM

## 2022-09-14 DIAGNOSIS — Z5181 Encounter for therapeutic drug level monitoring: Secondary | ICD-10-CM

## 2022-09-14 DIAGNOSIS — G459 Transient cerebral ischemic attack, unspecified: Secondary | ICD-10-CM

## 2022-09-14 MED ORDER — WARFARIN SODIUM 10 MG PO TABS: ORAL_TABLET | ORAL | 0 refills | Status: DC

## 2022-09-14 NOTE — Patient Instructions (Signed)
Description   RELEASE THE STANDING ORDER TO HAVE NEXT INR DRAWN. Spoke with pt and advised to continue taking 10mg  daily EXCEPT 5mg  on Monday, Wednesday, and Friday.  Stay consistent with greens each week (4-5 servings per week). Recheck INR in 4 weeks at [facility].  [facility] [phone]

## 2022-10-06 ENCOUNTER — Ambulatory Visit (INDEPENDENT_AMBULATORY_CARE_PROVIDER_SITE_OTHER): Payer: BC Managed Care – PPO

## 2022-10-06 ENCOUNTER — Ambulatory Visit (INDEPENDENT_AMBULATORY_CARE_PROVIDER_SITE_OTHER): Payer: BC Managed Care – PPO | Admitting: Family

## 2022-10-06 ENCOUNTER — Ambulatory Visit: Payer: BC Managed Care – PPO

## 2022-10-06 ENCOUNTER — Encounter: Payer: Self-pay | Admitting: Family

## 2022-10-06 VITALS — BP 133/61 | HR 59 | Temp 98.1°F | Resp 16 | Wt 197.0 lb

## 2022-10-06 DIAGNOSIS — Z23 Encounter for immunization: Secondary | ICD-10-CM | POA: Diagnosis not present

## 2022-10-06 DIAGNOSIS — R2242 Localized swelling, mass and lump, left lower limb: Secondary | ICD-10-CM | POA: Diagnosis not present

## 2022-10-06 NOTE — Assessment & Plan Note (Signed)
New. Will obtain LE doppler to rule out DVT as well as special soft tissue evaluation of the area of concern. I suspect that this finding is most likely due to hematoma.

## 2022-10-06 NOTE — Progress Notes (Signed)
Subjective:     Patient ID: Melinda Lopez, female    DOB: 1960/11/14, 62 y.o.   MRN: [id number]  Chief Complaint  Patient presents with   Cyst    Patient reports a knot on left leg    HPI  Discussed the use of AI scribe software for clinical note transcription with the patient, who gave verbal consent to proceed.  History of Present Illness   The patient presents with a knot on her lower left shin that she noticed over a month ago. She recalls hitting her leg on a footboard at an [employer] around the same time she noticed the knot.  She is maintained on coumadin. The knot is located at the height of the footboard, but the patient does not recall any bruising or bleeding at the time of the injury. The knot has persisted and is tender to touch. The patient is concerned about the knot as she is due to fly soon.          Health Maintenance Due  Topic Date Due   COVID-19 Vaccine (4 - 2023-24 season) 10/04/2022    Past Medical History:  Diagnosis Date   Carotid artery stenosis    Chronic headaches    Fatty liver 04/07/2016   Glaucoma    Hx of adenomatous polyp of colon 07/30/2011   Hx of blood clots    fingers   Hyperlipemia    Hypertension    Hypothyroidism    Migraine 1982   pt states migraines since then.    PFO (patent foramen ovale)    TIA (transient ischemic attack)     Past Surgical History:  Procedure Laterality Date   BREAST LUMPECTOMY Right 2000   CAROTID ARTERY - SUBCLAVIAN ARTERY BYPASS GRAFT Right 2006   COLONOSCOPY     UPPER GASTROINTESTINAL ENDOSCOPY      Family History  Problem Relation Age of Onset   Heart disease Mother        Vascular disease early 85s   Kidney disease Mother    Heart disease Father    Diabetes Sister    Kidney disease Sister    Heart murmur Brother    Heart disease Maternal Grandmother     Social History   Socioeconomic History   Marital status: Married    Spouse name: Not on file   Number of children: 2   Years  of education: Not on file   Highest education level: Not on file  Occupational History   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  Tobacco Use   Smoking status: Former    Current packs/day: 0.00    Types: Cigarettes    Quit date: 07/29/2002    Years since quitting: 20.2   Smokeless tobacco: Never  Vaping Use   Vaping status: Never Used  Substance and Sexual Activity   Alcohol use: Yes    Comment: 4 a week   Drug use: No   Sexual activity: Yes    Partners: Male  Other Topics Concern   Not on file  Social History Narrative   Former smoker- quit in the 19's   Married    2 children: 1982- daughter Leslee Home- lives in [place] Chrissie Noa- lives locally   2 grandchildren in [place]   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   Social Determinants of Health   Financial Resource Strain: Patient Declined (10/05/2022)   Overall Financial Resource Strain (CARDIA)    Difficulty of Paying Living Expenses: Patient  declined  Food Insecurity: Patient Declined (10/05/2022)   Hunger Vital Sign    Worried About Running Out of Food in the Last Year: Patient declined    Ran Out of Food in the Last Year: Patient declined  Transportation Needs: Patient Declined (10/05/2022)   PRAPARE - Administrator, Civil Service (Medical): Patient declined    Lack of Transportation (Non-Medical): Patient declined  Physical Activity: Unknown (10/05/2022)   Exercise Vital Sign    Days of Exercise per Week: Patient declined    Minutes of Exercise per Session: Not on file  Stress: Patient Declined (10/05/2022)   [employer] of Occupational Health - Occupational Stress Questionnaire    Feeling of Stress : Patient declined  Social Connections: Unknown (10/05/2022)   Social Connection and Isolation Panel [NHANES]    Frequency of Communication with Friends and Family: Patient declined    Frequency of Social Gatherings with Friends and Family: Patient declined     Attends Religious Services: Patient declined    Database administrator or Organizations: Patient declined    Attends Engineer, structural: Not on file    Marital Status: Patient declined  Intimate Partner Violence: Not on file    Outpatient Medications Prior to Visit  Medication Sig Dispense Refill   ALPRAZolam (XANAX) 0.5 MG tablet Take 1/2-1 tablet by mouth prior to flying. 20 tablet 0   amLODipine (NORVASC) 5 MG tablet TAKE 1 TABLET (5 MG TOTAL) BY MOUTH DAILY. 90 tablet 1   aspirin EC 81 MG tablet Take 81 mg by mouth daily. Swallow whole.     atorvastatin (LIPITOR) 20 MG tablet TAKE 1 TABLET BY MOUTH EVERY DAY 90 tablet 1   cholecalciferol (VITAMIN D3) 25 MCG (1000 UNIT) tablet Take 3,000 Units by mouth daily.     Cyanocobalamin (VITAMIN B-12 PO) Take 1 tablet by mouth daily.     EPINEPHRINE 0.3 mg/0.3 mL IJ SOAJ injection Inject 0.3 mg into the muscle as needed for anaphylaxis. Use as needed for severe allergic reaction 2 each 2   erythromycin ophthalmic ointment PLACE 1 APPLICATION INTO BOTH EYES 3 (THREE) TIMES DAILY. 3.5 g 0   fluticasone (FLONASE) 50 MCG/ACT nasal spray PLACE 1 SPRAY INTO BOTH NOSTRILS 2 (TWO) TIMES DAILY 48 mL 2   levothyroxine (SYNTHROID) 100 MCG tablet TAKE 1 TABLET BY MOUTH EVERY DAY DUE 06/21/21 90 tablet 1   omeprazole (PRILOSEC) 40 MG capsule TAKE 1 CAPSULE (40 MG TOTAL) BY MOUTH DAILY. 90 capsule 1   SUMAtriptan (IMITREX) 50 MG tablet MAY REPEAT IN 2 HOURS IF HEADACHE PERSISTS OR RECURS. 10 tablet 2   triamcinolone cream (KENALOG) 0.1 % APPLY 1 APPLICATION TOPICALLY TWICE A DAY AS NEEDED 30 g 0   warfarin (COUMADIN) 10 MG tablet TAKE 1/2 TABLET TO 1 TABLET BY MOUTH DAILY AS DIRECTED BY COUMADIN CLINIC 75 tablet 0   Facility-Administered Medications Prior to Visit  Medication Dose Route Frequency Provider Last Rate Last Admin   0.9 %  sodium chloride infusion   Intravenous PRN Bhagat, Bhavinkumar, PA        Allergies  Allergen Reactions    Shellfish Allergy Anaphylaxis   Codeine Nausea Only   Morphine Other (See Comments)    Other reaction(s): Other (See Comments), slurring words, slurring words   Morphine And Codeine Other (See Comments)    slurring words   Phenergan [Promethazine] Other (See Comments)    Causes blood presser to bottom out   Sulfa Antibiotics Rash and  Nausea And Vomiting    Nausea    ROS    See HPI Objective:    Physical Exam Constitutional:      Appearance: Normal appearance.  HENT:     Head: Normocephalic and atraumatic.  Musculoskeletal:     Comments: Firm elogated non mobile mass noted overlying left mid/lateral shin  Skin:    General: Skin is warm and dry.  Neurological:     Mental Status: She is alert and oriented to person, place, and time.      BP 133/61 (BP Location: Right Arm, Patient Position: Sitting, Cuff Size: Small)   Pulse (!) 59   Temp 98.1 F (36.7 C) (Oral)   Resp 16   Wt 197 lb (89.4 kg)   SpO2 99%   BMI 30.85 kg/m  Wt Readings from Last 3 Encounters:  10/06/22 197 lb (89.4 kg)  07/27/22 194 lb (88 kg)  07/16/22 194 lb (88 kg)       Assessment & Plan:   Problem List Items Addressed This Visit       Unprioritized   Mass of left lower extremity - Primary    New. Will obtain LE doppler to rule out DVT as well as special soft tissue evaluation of the area of concern. I suspect that this finding is most likely due to hematoma.       Relevant Orders   [place] LT LOWER EXTREM LTD SOFT TISSUE NON VASCULAR   US Venous Img Lower Unilateral Left (DVT) (Completed)   Other Visit Diagnoses     Needs flu shot       Relevant Orders   Flu vaccine trivalent PF, 6mos and older(Flulaval,Afluria,Fluarix,Fluzone) (Completed)       I am having Leslie M. Salvaggio maintain her ALPRAZolam, fluticasone, aspirin EC, Cyanocobalamin (VITAMIN B-12 PO), levothyroxine, omeprazole, EPINEPHrine, amLODipine, atorvastatin, cholecalciferol, triamcinolone cream, erythromycin,  SUMAtriptan, and warfarin. We will continue to administer sodium chloride.  No orders of the defined types were placed in this encounter.

## 2022-10-06 NOTE — Progress Notes (Signed)
Subjective:     Patient ID: Melinda Lopez, female    DOB: 1960-12-17, 62 y.o.   MRN: [id number]  Chief Complaint  Patient presents with   Cyst    Patient reports a knot on left leg    HPI  Discussed the use of AI scribe software for clinical note transcription with the patient, who gave verbal consent to proceed.  62 year old female presents to the clinic today for an area of concern on her left lower anterior shin/calf calf. She states that [place] the [place]. And she states that the area never bleed or bruised but she noticed a knot that formed. She states that the area has went down in size of what it first was but she states that she is still feeling an knot. She reported no pain when walking or bearing weighting on the leg. She states that the area is just sensitive to touch. Skin looks a little discolored in the area. Area is not moveable upon palpation and hard marble type of structure. Patient is concerned because she is on coumadin and is going out of town soon.        Health Maintenance Due  Topic Date Due   INFLUENZA VACCINE  09/03/2022   COVID-19 Vaccine (4 - 2023-24 season) 10/04/2022    Past Medical History:  Diagnosis Date   Carotid artery stenosis    Chronic headaches    Fatty liver 04/07/2016   Glaucoma    Hx of adenomatous polyp of colon 07/30/2011   Hx of blood clots    fingers   Hyperlipemia    Hypertension    Hypothyroidism    Migraine 1982   pt states migraines since then.    PFO (patent foramen ovale)    TIA (transient ischemic attack)     Past Surgical History:  Procedure Laterality Date   BREAST LUMPECTOMY Right 2000   CAROTID ARTERY - SUBCLAVIAN ARTERY BYPASS GRAFT Right 2006   COLONOSCOPY     UPPER GASTROINTESTINAL ENDOSCOPY      Family History  Problem Relation Age of Onset   Heart disease Mother        Vascular disease early 64s   Kidney disease Mother    Heart disease Father    Diabetes Sister     Kidney disease Sister    Heart murmur Brother    Heart disease Maternal Grandmother     Social History   Socioeconomic History   Marital status: Married    Spouse name: Not on file   Number of children: 2   Years of education: Not on file   Highest education level: Not on file  Occupational History   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  Tobacco Use   Smoking status: Former    Current packs/day: 0.00    Types: Cigarettes    Quit date: 07/29/2002    Years since quitting: 20.2   Smokeless tobacco: Never  Vaping Use   Vaping status: Never Used  Substance and Sexual Activity   Alcohol use: Yes    Comment: 4 a week   Drug use: No   Sexual activity: Yes    Partners: Male  Other Topics Concern   Not on file  Social History Narrative   Former smoker- quit in the 79's   Married    2 children: 1982- daughter Leslee Home- lives in [place] Chrissie Noa- lives locally   2 grandchildren  in [place]   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   Social Determinants of Health   Financial Resource Strain: Patient Declined (10/05/2022)   Overall Financial Resource Strain (CARDIA)    Difficulty of Paying Living Expenses: Patient declined  Food Insecurity: Patient Declined (10/05/2022)   Hunger Vital Sign    Worried About Running Out of Food in the Last Year: Patient declined    Ran Out of Food in the Last Year: Patient declined  Transportation Needs: Patient Declined (10/05/2022)   PRAPARE - Administrator, Civil Service (Medical): Patient declined    Lack of Transportation (Non-Medical): Patient declined  Physical Activity: Unknown (10/05/2022)   Exercise Vital Sign    Days of Exercise per Week: Patient declined    Minutes of Exercise per Session: Not on file  Stress: Patient Declined (10/05/2022)   [employer] of Occupational Health - Occupational Stress Questionnaire    Feeling of Stress : Patient declined  Social Connections:  Unknown (10/05/2022)   Social Connection and Isolation Panel [NHANES]    Frequency of Communication with Friends and Family: Patient declined    Frequency of Social Gatherings with Friends and Family: Patient declined    Attends Religious Services: Patient declined    Database administrator or Organizations: Patient declined    Attends Engineer, structural: Not on file    Marital Status: Patient declined  Intimate Partner Violence: Not on file    Outpatient Medications Prior to Visit  Medication Sig Dispense Refill   ALPRAZolam (XANAX) 0.5 MG tablet Take 1/2-1 tablet by mouth prior to flying. 20 tablet 0   amLODipine (NORVASC) 5 MG tablet TAKE 1 TABLET (5 MG TOTAL) BY MOUTH DAILY. 90 tablet 1   aspirin EC 81 MG tablet Take 81 mg by mouth daily. Swallow whole.     atorvastatin (LIPITOR) 20 MG tablet TAKE 1 TABLET BY MOUTH EVERY DAY 90 tablet 1   cholecalciferol (VITAMIN D3) 25 MCG (1000 UNIT) tablet Take 3,000 Units by mouth daily.     Cyanocobalamin (VITAMIN B-12 PO) Take 1 tablet by mouth daily.     EPINEPHRINE 0.3 mg/0.3 mL IJ SOAJ injection Inject 0.3 mg into the muscle as needed for anaphylaxis. Use as needed for severe allergic reaction 2 each 2   erythromycin ophthalmic ointment PLACE 1 APPLICATION INTO BOTH EYES 3 (THREE) TIMES DAILY. 3.5 g 0   fluticasone (FLONASE) 50 MCG/ACT nasal spray PLACE 1 SPRAY INTO BOTH NOSTRILS 2 (TWO) TIMES DAILY 48 mL 2   levothyroxine (SYNTHROID) 100 MCG tablet TAKE 1 TABLET BY MOUTH EVERY DAY DUE 06/21/21 90 tablet 1   omeprazole (PRILOSEC) 40 MG capsule TAKE 1 CAPSULE (40 MG TOTAL) BY MOUTH DAILY. 90 capsule 1   SUMAtriptan (IMITREX) 50 MG tablet MAY REPEAT IN 2 HOURS IF HEADACHE PERSISTS OR RECURS. 10 tablet 2   triamcinolone cream (KENALOG) 0.1 % APPLY 1 APPLICATION TOPICALLY TWICE A DAY AS NEEDED 30 g 0   warfarin (COUMADIN) 10 MG tablet TAKE 1/2 TABLET TO 1 TABLET BY MOUTH DAILY AS DIRECTED BY COUMADIN CLINIC 75 tablet 0    Facility-Administered Medications Prior to Visit  Medication Dose Route Frequency Provider Last Rate Last Admin   0.9 %  sodium chloride infusion   Intravenous PRN Bhagat, Bhavinkumar, PA        Allergies  Allergen Reactions   Shellfish Allergy Anaphylaxis   Codeine Nausea Only   Morphine Other (See Comments)    Other reaction(s): Other (See  Comments), slurring words, slurring words   Morphine And Codeine Other (See Comments)    slurring words   Phenergan [Promethazine] Other (See Comments)    Causes blood presser to bottom out   Sulfa Antibiotics Rash and Nausea And Vomiting    Nausea    Review of Systems  Constitutional:  Negative for chills and malaise/fatigue.  Respiratory:  Negative for shortness of breath.   Cardiovascular:  Negative for leg swelling.  Gastrointestinal:  Negative for constipation and heartburn.  Musculoskeletal:  Negative for joint pain and myalgias.  Skin:  Negative for itching and rash.       Knot to the lower left leg   Neurological:  Negative for dizziness and tremors.  Psychiatric/Behavioral:  Negative for depression, substance abuse and suicidal ideas.        Objective:    Physical Exam Constitutional:      Appearance: Normal appearance. She is normal weight.  HENT:     Head: Normocephalic.     Nose: Nose normal.     Mouth/Throat:     Mouth: Mucous membranes are moist.  Cardiovascular:     Rate and Rhythm: Normal rate and regular rhythm.     Pulses: Normal pulses.     Heart sounds: Normal heart sounds.  Pulmonary:     Effort: Pulmonary effort is normal.     Breath sounds: Normal breath sounds.  Musculoskeletal:        General: Normal range of motion.     Cervical back: Normal range of motion.  Skin:    General: Skin is warm.     Comments: Knot noted to left lower leg   Neurological:     Mental Status: She is alert.  Psychiatric:        Mood and Affect: Mood normal.        Behavior: Behavior normal.        Thought Content:  Thought content normal.        Judgment: Judgment normal.      BP 133/61 (BP Location: Right Arm, Patient Position: Sitting, Cuff Size: Small)   Pulse (!) 59   Temp 98.1 F (36.7 C) (Oral)   Resp 16   Wt 197 lb (89.4 kg)   SpO2 99%   BMI 30.85 kg/m  Wt Readings from Last 3 Encounters:  10/06/22 197 lb (89.4 kg)  07/27/22 194 lb (88 kg)  07/16/22 194 lb (88 kg)       Assessment & Plan:   Problem List Items Addressed This Visit   None   I am having Journii M. Mcwethy maintain her ALPRAZolam, fluticasone, aspirin EC, Cyanocobalamin (VITAMIN B-12 PO), levothyroxine, omeprazole, EPINEPHrine, amLODipine, atorvastatin, cholecalciferol, triamcinolone cream, erythromycin, SUMAtriptan, and warfarin. We will continue to administer sodium chloride.

## 2022-10-08 ENCOUNTER — Telehealth: Payer: Self-pay | Admitting: *Deleted

## 2022-10-08 NOTE — Telephone Encounter (Signed)
Called pt since INR was due on yesterday. She states she will go today and was appreciative of the call. Will follow up once results received.

## 2022-10-09 ENCOUNTER — Telehealth: Payer: Self-pay | Admitting: *Deleted

## 2022-10-09 ENCOUNTER — Ambulatory Visit (INDEPENDENT_AMBULATORY_CARE_PROVIDER_SITE_OTHER): Payer: BC Managed Care – PPO | Admitting: *Deleted

## 2022-10-09 DIAGNOSIS — D6859 Other primary thrombophilia: Secondary | ICD-10-CM

## 2022-10-09 DIAGNOSIS — Z7901 Long term (current) use of anticoagulants: Secondary | ICD-10-CM | POA: Diagnosis not present

## 2022-10-09 DIAGNOSIS — G459 Transient cerebral ischemic attack, unspecified: Secondary | ICD-10-CM | POA: Diagnosis not present

## 2022-10-09 NOTE — Telephone Encounter (Signed)
Patient does not need F/U appointment. Not using pessary daily.

## 2022-10-09 NOTE — Patient Instructions (Signed)
Description   RELEASE THE STANDING ORDER TO HAVE NEXT INR DRAWN. Spoke with pt and advised to continue taking 10mg  daily EXCEPT 5mg  on Monday, Wednesday, and Friday.  Stay consistent with greens each week (4-5 servings per week). Recheck INR in 5 weeks at [facility].  [facility] [phone]

## 2022-11-01 NOTE — Progress Notes (Unsigned)
No chief complaint on file.  History of Present Illness: 62 yo female with history of carotid artery disease, HTN, hyperlipidemia, hypothyroidism, possible PFO, prior TIA and hypercoagulable state (antiphospholipid antibody syndrome) who is here today for follow up. I saw her as a new patient in March 2024. She underwent right carotid endarterectomy at age 37. She had a TIA in her 54s. She has antiphospholipid syndrome. She was told that she had a PFO many years ago and has been on coumadin chronically. She had been followed at [facility] and recently wanted to change her care to [facility]. She has seen Dr. Myra Gianotti in [facility]. At her first visit here in March 2024 she described dyspnea and fatigue. Coronary CTA April 2024 showed mild plaque in the let main, LAD and RCA. Echo May 2024 with normal LV function and no valve disease. Negative bubble study for interatrial shunting.   She is here today for follow up. The patient denies any chest pain, dyspnea, palpitations, lower extremity edema, orthopnea, PND, dizziness, near syncope or syncope.     Primary Care Physician: [name], NP   Past Medical History:  Diagnosis Date   Carotid artery stenosis    Chronic headaches    Fatty liver 04/07/2016   Glaucoma    Hx of adenomatous polyp of colon 07/30/2011   Hx of blood clots    fingers   Hyperlipemia    Hypertension    Hypothyroidism    Migraine 1982   pt states migraines since then.    PFO (patent foramen ovale)    TIA (transient ischemic attack)     Past Surgical History:  Procedure Laterality Date   BREAST LUMPECTOMY Right 2000   CAROTID ARTERY - SUBCLAVIAN ARTERY BYPASS GRAFT Right 2006   COLONOSCOPY     UPPER GASTROINTESTINAL ENDOSCOPY      Current Outpatient Medications  Medication Sig Dispense Refill   ALPRAZolam (XANAX) 0.5 MG tablet Take 1/2-1 tablet by mouth prior to flying. 20 tablet 0   amLODipine (NORVASC) 5 MG tablet TAKE 1 TABLET (5 MG TOTAL) BY MOUTH  DAILY. 90 tablet 1   aspirin EC 81 MG tablet Take 81 mg by mouth daily. Swallow whole.     atorvastatin (LIPITOR) 20 MG tablet TAKE 1 TABLET BY MOUTH EVERY DAY 90 tablet 1   cholecalciferol (VITAMIN D3) 25 MCG (1000 UNIT) tablet Take 3,000 Units by mouth daily.     Cyanocobalamin (VITAMIN B-12 PO) Take 1 tablet by mouth daily.     EPINEPHRINE 0.3 mg/0.3 mL IJ SOAJ injection Inject 0.3 mg into the muscle as needed for anaphylaxis. Use as needed for severe allergic reaction 2 each 2   erythromycin ophthalmic ointment PLACE 1 APPLICATION INTO BOTH EYES 3 (THREE) TIMES DAILY. 3.5 g 0   fluticasone (FLONASE) 50 MCG/ACT nasal spray PLACE 1 SPRAY INTO BOTH NOSTRILS 2 (TWO) TIMES DAILY 48 mL 2   levothyroxine (SYNTHROID) 100 MCG tablet TAKE 1 TABLET BY MOUTH EVERY DAY DUE 06/21/21 90 tablet 1   omeprazole (PRILOSEC) 40 MG capsule TAKE 1 CAPSULE (40 MG TOTAL) BY MOUTH DAILY. 90 capsule 1   SUMAtriptan (IMITREX) 50 MG tablet MAY REPEAT IN 2 HOURS IF HEADACHE PERSISTS OR RECURS. 10 tablet 2   triamcinolone cream (KENALOG) 0.1 % APPLY 1 APPLICATION TOPICALLY TWICE A DAY AS NEEDED 30 g 0   warfarin (COUMADIN) 10 MG tablet TAKE 1/2 TABLET TO 1 TABLET BY MOUTH DAILY AS DIRECTED BY COUMADIN CLINIC 75 tablet 0  Current Facility-Administered Medications  Medication Dose Route Frequency Provider Last Rate Last Admin   0.9 %  sodium chloride infusion   Intravenous PRN Bhagat, Bhavinkumar, PA        Allergies  Allergen Reactions   Shellfish Allergy Anaphylaxis   Codeine Nausea Only   Morphine Other (See Comments)    Other reaction(s): Other (See Comments), slurring words, slurring words   Morphine And Codeine Other (See Comments)    slurring words   Phenergan [Promethazine] Other (See Comments)    Causes blood presser to bottom out   Sulfa Antibiotics Rash and Nausea And Vomiting    Nausea    Social History   Socioeconomic History   Marital status: Married    Spouse name: Not on file   Number of  children: 2   Years of education: Not on file   Highest education level: Not on file  Occupational History   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  Tobacco Use   Smoking status: Former    Current packs/day: 0.00    Types: Cigarettes    Quit date: 07/29/2002    Years since quitting: 20.2   Smokeless tobacco: Never  Vaping Use   Vaping status: Never Used  Substance and Sexual Activity   Alcohol use: Yes    Comment: 4 a week   Drug use: No   Sexual activity: Yes    Partners: Male  Other Topics Concern   Not on file  Social History Narrative   Former smoker- quit in the 110's   Married    2 children: 1982- daughter Corporate treasurer- lives in [place] Chrissie Noa- lives locally   2 grandchildren in [place]   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   Social Determinants of Health   Financial Resource Strain: Patient Declined (10/05/2022)   Overall Financial Resource Strain (CARDIA)    Difficulty of Paying Living Expenses: Patient declined  Food Insecurity: Patient Declined (10/05/2022)   Hunger Vital Sign    Worried About Running Out of Food in the Last Year: Patient declined    Ran Out of Food in the Last Year: Patient declined  Transportation Needs: Patient Declined (10/05/2022)   PRAPARE - Administrator, Civil Service (Medical): Patient declined    Lack of Transportation (Non-Medical): Patient declined  Physical Activity: Unknown (10/05/2022)   Exercise Vital Sign    Days of Exercise per Week: Patient declined    Minutes of Exercise per Session: Not on file  Stress: Patient Declined (10/05/2022)   [employer] of Occupational Health - Occupational Stress Questionnaire    Feeling of Stress : Patient declined  Social Connections: Unknown (10/05/2022)   Social Connection and Isolation Panel [NHANES]    Frequency of Communication with Friends and Family: Patient declined    Frequency of Social Gatherings with Friends and Family:  Patient declined    Attends Religious Services: Patient declined    Database administrator or Organizations: Patient declined    Attends Engineer, structural: Not on file    Marital Status: Patient declined  Catering manager Violence: Not on file    Family History  Problem Relation Age of Onset   Heart disease Mother        Vascular disease early 27s   Kidney disease Mother    Heart disease Father    Diabetes Sister    Kidney disease Sister    Heart murmur Brother    Heart disease Maternal Grandmother  Review of Systems:  As stated in the HPI and otherwise negative.   There were no vitals taken for this visit.  Physical Examination: General: Well developed, well nourished, NAD  HEENT: OP clear, mucus membranes moist  SKIN: warm, dry. No rashes. Neuro: No focal deficits  Musculoskeletal: Muscle strength 5/5 all ext  Psychiatric: Mood and affect normal  Neck: No JVD, no carotid bruits, no thyromegaly, no lymphadenopathy.  Lungs:Clear bilaterally, no wheezes, rhonci, crackles Cardiovascular: Regular rate and rhythm. No murmurs, gallops or rubs. Abdomen:Soft. Bowel sounds present. Non-tender.  Extremities: No lower extremity edema. Pulses are 2 + in the bilateral DP/PT.  EKG:  EKG is not *** ordered today. The ekg ordered today demonstrates   Recent Labs: 06/12/2022: ALT 42; BUN 12; Creatinine, Ser 0.73; Potassium 3.9; Sodium 140; TSH 1.48   Lipid Panel    Component Value Date/Time   CHOL 137 06/12/2022 0748   TRIG 54.0 06/12/2022 0748   HDL 57.00 06/12/2022 0748   CHOLHDL 2 06/12/2022 0748   VLDL 10.8 06/12/2022 0748   LDLCALC 69 06/12/2022 0748   LDLCALC 60 10/26/2019 0916     Wt Readings from Last 3 Encounters:  10/06/22 89.4 kg  07/27/22 88 kg  07/16/22 88 kg    Assessment and Plan:   1. CAD without angina: Mild CAD by coronary CTA in April 2024. Continue ASA and statin.    2. Hypercoagulable disorder: Continue coumadin.   3. HTN: BP is  controlled. No changes  Labs/ tests ordered today include:   No orders of the defined types were placed in this encounter.  Disposition:   F/U with me in 12 months    Signed, Verne Carrow, MD, [facility] 11/01/2022 5:29 PM    [facility] [address] [place], [place], [place]  [address] Phone: [phone]; Fax: [phone]

## 2022-11-02 ENCOUNTER — Ambulatory Visit: Payer: BC Managed Care – PPO | Attending: Cardiovascular Disease | Admitting: Cardiovascular Disease

## 2022-11-02 ENCOUNTER — Encounter: Payer: Self-pay | Admitting: Cardiovascular Disease

## 2022-11-02 VITALS — BP 130/82 | HR 58 | Ht 67.0 in | Wt 197.0 lb

## 2022-11-02 DIAGNOSIS — I10 Essential (primary) hypertension: Secondary | ICD-10-CM | POA: Diagnosis not present

## 2022-11-02 DIAGNOSIS — I2510 Atherosclerotic heart disease of native coronary artery without angina pectoris: Secondary | ICD-10-CM

## 2022-11-02 NOTE — Patient Instructions (Signed)

## 2022-11-11 ENCOUNTER — Telehealth: Payer: Self-pay | Admitting: *Deleted

## 2022-11-11 NOTE — Telephone Encounter (Signed)
Called pt since INR is due; there was no answer so left her a message to call back.

## 2022-11-13 ENCOUNTER — Telehealth: Payer: Self-pay | Admitting: *Deleted

## 2022-11-13 NOTE — Telephone Encounter (Signed)
Called pt since INR is overdue; there was no answer so left a message for her to call back. Will await and follow up.

## 2022-11-16 ENCOUNTER — Telehealth: Payer: Self-pay | Admitting: *Deleted

## 2022-11-16 NOTE — Telephone Encounter (Signed)
Called pt since she is overdue to have her INR completed. There was no answer so left another message regarding this and advised to call us with an update. Will await and follow up.

## 2022-11-17 ENCOUNTER — Telehealth: Payer: Self-pay | Admitting: *Deleted

## 2022-11-17 ENCOUNTER — Other Ambulatory Visit: Payer: Self-pay | Admitting: *Deleted

## 2022-11-17 DIAGNOSIS — D6859 Other primary thrombophilia: Secondary | ICD-10-CM

## 2022-11-17 DIAGNOSIS — G459 Transient cerebral ischemic attack, unspecified: Secondary | ICD-10-CM

## 2022-11-17 DIAGNOSIS — Z7901 Long term (current) use of anticoagulants: Secondary | ICD-10-CM

## 2022-11-17 NOTE — Telephone Encounter (Signed)
Called pt since INR is overdue, there was no answer. Left her a message to call us back.

## 2022-11-17 NOTE — Telephone Encounter (Addendum)
Pt called and stated she has been without good phone service and out of town. She will be able to go on Thursday 11/19/22 to get INR done.

## 2022-11-21 LAB — PROTIME-INR
INR: 2.9 — ABNORMAL HIGH (ref 0.9–1.2)
Prothrombin Time: 29.7 s — ABNORMAL HIGH (ref 9.1–12.0)

## 2022-11-23 ENCOUNTER — Telehealth: Payer: Self-pay

## 2022-11-23 ENCOUNTER — Ambulatory Visit (INDEPENDENT_AMBULATORY_CARE_PROVIDER_SITE_OTHER): Payer: BC Managed Care – PPO | Admitting: Cardiology

## 2022-11-23 DIAGNOSIS — G459 Transient cerebral ischemic attack, unspecified: Secondary | ICD-10-CM

## 2022-11-23 DIAGNOSIS — D6859 Other primary thrombophilia: Secondary | ICD-10-CM | POA: Diagnosis not present

## 2022-11-23 DIAGNOSIS — Z7901 Long term (current) use of anticoagulants: Secondary | ICD-10-CM

## 2022-11-23 NOTE — Telephone Encounter (Signed)
Lpmtcb to discuss INR result.

## 2022-12-14 ENCOUNTER — Ambulatory Visit: Payer: BC Managed Care – PPO | Admitting: Family

## 2022-12-15 ENCOUNTER — Ambulatory Visit (INDEPENDENT_AMBULATORY_CARE_PROVIDER_SITE_OTHER): Payer: BC Managed Care – PPO | Admitting: Family

## 2022-12-15 VITALS — BP 131/60 | HR 55 | Temp 98.6°F | Resp 16 | Ht 67.0 in | Wt 195.0 lb

## 2022-12-15 DIAGNOSIS — E039 Hypothyroidism, unspecified: Secondary | ICD-10-CM | POA: Diagnosis not present

## 2022-12-15 DIAGNOSIS — Z6830 Body mass index (BMI) 30.0-30.9, adult: Secondary | ICD-10-CM

## 2022-12-15 DIAGNOSIS — E66811 Obesity, class 1: Secondary | ICD-10-CM | POA: Insufficient documentation

## 2022-12-15 DIAGNOSIS — R739 Hyperglycemia, unspecified: Secondary | ICD-10-CM

## 2022-12-15 DIAGNOSIS — E785 Hyperlipidemia, unspecified: Secondary | ICD-10-CM

## 2022-12-15 DIAGNOSIS — I10 Essential (primary) hypertension: Secondary | ICD-10-CM

## 2022-12-15 DIAGNOSIS — D689 Coagulation defect, unspecified: Secondary | ICD-10-CM

## 2022-12-15 MED ORDER — ZEPBOUND 2.5 MG/0.5ML ~~LOC~~ SOAJ
2.5000 mg | SUBCUTANEOUS | 0 refills | Status: DC
Start: 2022-12-15 — End: 2023-02-01

## 2022-12-15 MED ORDER — ATORVASTATIN CALCIUM 20 MG PO TABS: 20.0000 mg | ORAL_TABLET | Freq: Every day | ORAL | 1 refills | Status: DC

## 2022-12-15 NOTE — Assessment & Plan Note (Signed)
Clinically stable on synthroid. Obtain follow up TSH.  

## 2022-12-15 NOTE — Patient Instructions (Signed)
VISIT SUMMARY:  During your follow-up visit, we reviewed your current health status and management plans for your existing conditions. Your blood pressure is well controlled, and your other conditions, including migraines, carotid artery disease, and reflux, are stable. We discussed your difficulty with weight loss and decided to start a new medication to assist with this. Routine labs were ordered to monitor your overall health.  YOUR PLAN:  -HYPERTENSION: Hypertension means high blood pressure. Your blood pressure is well controlled with your current medication, Amlodipine 5mg  daily. Please continue taking this medication as prescribed.  -MIGRAINES: Migraines are severe headaches often accompanied by nausea and sensitivity to light. You have not had any recent episodes and manage minor ones with over-the-counter medication. Continue with your current management.  -CAROTID ARTERY DISEASE: Carotid artery disease involves the narrowing of the carotid arteries, which can lead to stroke. Your condition is stable with no significant narrowing noted on your last ultrasound. Continue with your current management.  -CORONARY ARTERY DISEASE: Coronary artery disease is the narrowing or blockage of the coronary arteries. You are taking Aspirin 81mg  daily and cholesterol medication for prevention. Please continue this medication.  -ANXIETY: Anxiety is a feeling of worry or fear. You use Xanax as needed, primarily for flying, but your usage has decreased. Continue using Xanax as needed.  -GASTROESOPHAGEAL REFLUX DISEASE (GERD): GERD is a condition where stomach acid frequently flows back into the tube connecting your mouth and stomach. Your symptoms are well managed with Omeprazole. Continue taking Omeprazole as prescribed.  -HYPERLIPIDEMIA: Hyperlipidemia means having high levels of fat particles (lipids) in the blood. Your condition is well controlled with Atorvastatin. Continue taking Atorvastatin as  prescribed.  -WEIGHT MANAGEMENT: You have had difficulty losing weight despite diet and exercise. We discussed starting a weight loss medication called Zepbound or Mounjaro. A prescription has been sent to the pharmacy, and we will await insurance approval. Please check in 3-4 weeks after starting the medication to assess your response and any side effects.  -GENERAL HEALTH MAINTENANCE: We will check your thyroid function, kidney function, and blood sugar today. Consider getting a COVID-19 booster vaccination. Continue to carry your EpiPen for your shellfish allergy and ensure it is up to date. Follow up in 6 months or sooner if any issues arise.  INSTRUCTIONS:  Please follow up in 3-4 weeks after starting the weight loss medication to assess your response and any side effects. Additionally, schedule a follow-up appointment in 6 months or sooner if any issues arise.

## 2022-12-15 NOTE — Addendum Note (Signed)
Addended by: [name] on: 12/15/2022 01:59 PM   Modules accepted: Orders

## 2022-12-15 NOTE — Assessment & Plan Note (Signed)
  Difficulty losing weight despite diet and exercise. Discussed potential use of weight loss medication (Mounjaro). -Send prescription for [facility] to pharmacy and await insurance approval. -Check in 3-4 weeks after starting medication to assess response and side effects.

## 2022-12-15 NOTE — Assessment & Plan Note (Signed)
Maintained on chronic coumadin.  Followed by coumadin clinic.

## 2022-12-15 NOTE — Progress Notes (Signed)
Subjective:     Patient ID: Melinda Lopez, female    DOB: 06/06/1960, 62 y.o.   MRN: [id number]  Chief Complaint  Patient presents with   Hypertension    Here for follow up   Hyperlipidemia    Here for follow up    HPI  Discussed the use of AI scribe software for clinical note transcription with the patient, who gave verbal consent to proceed.  History of Present Illness   The patient, with a history of hypertension, migraines, carotid artery disease, shellfish allergy, and reflux, presents for a regular follow-up visit. She reports good blood pressure control on amlodipine 5mg , and her Coumadin is managed by a cardiologist. She has not had any recent migraines, and manages any minor episodes with over-the-counter medication. Her carotid artery disease is stable, with no significant narrowing noted on recent tests. She has been started on aspirin for heart health. She has a shellfish allergy and carries an EpiPen. Her reflux is well-managed with omeprazole. She also takes Xanax as needed for flying, but has not needed it recently. She has a history of slightly high blood sugar levels, which is being monitored. The patient is also considering starting a weight loss medication due to difficulty losing weight despite diet and exercise.     Lab Results  Component Value Date   TSH 1.48 06/12/2022   Lab Results  Component Value Date   CHOL 137 06/12/2022   HDL 57.00 06/12/2022   LDLCALC 69 06/12/2022   TRIG 54.0 06/12/2022   CHOLHDL 2 06/12/2022        Health Maintenance Due  Topic Date Due   COVID-19 Vaccine (4 - 2023-24 season) 10/04/2022    Past Medical History:  Diagnosis Date   Carotid artery stenosis    Chronic headaches    Fatty liver 04/07/2016   Glaucoma    Hx of adenomatous polyp of colon 07/30/2011   Hx of blood clots    fingers   Hyperlipemia    Hypertension    Hypothyroidism    Migraine 1982   pt states migraines since then.    PFO (patent foramen  ovale)    TIA (transient ischemic attack)     Past Surgical History:  Procedure Laterality Date   BREAST LUMPECTOMY Right 2000   CAROTID ARTERY - SUBCLAVIAN ARTERY BYPASS GRAFT Right 2006   COLONOSCOPY     UPPER GASTROINTESTINAL ENDOSCOPY      Family History  Problem Relation Age of Onset   Heart disease Mother        Vascular disease early 8s   Kidney disease Mother    Heart disease Father    Diabetes Sister    Kidney disease Sister    Heart murmur Brother    Heart disease Maternal Grandmother     Social History   Socioeconomic History   Marital status: Married    Spouse name: Not on file   Number of children: 2   Years of education: Not on file   Highest education level: Not on file  Occupational History   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  Tobacco Use   Smoking status: Former    Current packs/day: 0.00    Types: Cigarettes    Quit date: 07/29/2002    Years since quitting: 20.3   Smokeless tobacco: Never  Vaping Use   Vaping status: Never Used  Substance and Sexual Activity   Alcohol use: Yes    Comment: 4 a week  Drug use: No   Sexual activity: Yes    Partners: Male  Other Topics Concern   Not on file  Social History Narrative   Former smoker- quit in the 26's   Married    2 children: 1982- daughter Leslee Home- lives in [place] Chrissie Noa- lives locally   2 grandchildren in [place]   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   Social Determinants of Health   Financial Resource Strain: Patient Declined (12/15/2022)   Overall Financial Resource Strain (CARDIA)    Difficulty of Paying Living Expenses: Patient declined  Food Insecurity: Patient Declined (12/15/2022)   Hunger Vital Sign    Worried About Running Out of Food in the Last Year: Patient declined    Ran Out of Food in the Last Year: Patient declined  Transportation Needs: Patient Declined (12/15/2022)   PRAPARE - Scientist, research (physical sciences) (Medical): Patient declined    Lack of Transportation (Non-Medical): Patient declined  Physical Activity: Insufficiently Active (12/15/2022)   Exercise Vital Sign    Days of Exercise per Week: 4 days    Minutes of Exercise per Session: 30 min  Stress: No Stress Concern Present (12/15/2022)   [employer] of Occupational Health - Occupational Stress Questionnaire    Feeling of Stress : Only a little  Social Connections: Unknown (12/15/2022)   Social Connection and Isolation Panel [NHANES]    Frequency of Communication with Friends and Family: Patient declined    Frequency of Social Gatherings with Friends and Family: Patient declined    Attends Religious Services: Patient declined    Database administrator or Organizations: No    Attends Engineer, structural: Not on file    Marital Status: Patient declined  Intimate Partner Violence: Not on file    Outpatient Medications Prior to Visit  Medication Sig Dispense Refill   ALPRAZolam (XANAX) 0.5 MG tablet Take 1/2-1 tablet by mouth prior to flying. 20 tablet 0   amLODipine (NORVASC) 5 MG tablet TAKE 1 TABLET (5 MG TOTAL) BY MOUTH DAILY. 90 tablet 1   aspirin EC 81 MG tablet Take 81 mg by mouth daily. Swallow whole.     cholecalciferol (VITAMIN D3) 25 MCG (1000 UNIT) tablet Take 3,000 Units by mouth daily.     Cyanocobalamin (VITAMIN B-12 PO) Take 1 tablet by mouth daily.     EPINEPHRINE 0.3 mg/0.3 mL IJ SOAJ injection Inject 0.3 mg into the muscle as needed for anaphylaxis. Use as needed for severe allergic reaction 2 each 2   erythromycin ophthalmic ointment PLACE 1 APPLICATION INTO BOTH EYES 3 (THREE) TIMES DAILY. 3.5 g 0   fluticasone (FLONASE) 50 MCG/ACT nasal spray PLACE 1 SPRAY INTO BOTH NOSTRILS 2 (TWO) TIMES DAILY (Patient taking differently: Place 1 spray into both nostrils daily.) 48 mL 2   levothyroxine (SYNTHROID) 100 MCG tablet TAKE 1 TABLET BY MOUTH EVERY DAY DUE 06/21/21 90 tablet 1   omeprazole  (PRILOSEC) 40 MG capsule TAKE 1 CAPSULE (40 MG TOTAL) BY MOUTH DAILY. 90 capsule 1   SUMAtriptan (IMITREX) 50 MG tablet MAY REPEAT IN 2 HOURS IF HEADACHE PERSISTS OR RECURS. 10 tablet 2   triamcinolone cream (KENALOG) 0.1 % APPLY 1 APPLICATION TOPICALLY TWICE A DAY AS NEEDED 30 g 0   warfarin (COUMADIN) 10 MG tablet TAKE 1/2 TABLET TO 1 TABLET BY MOUTH DAILY AS DIRECTED BY COUMADIN CLINIC 75 tablet 0   atorvastatin (LIPITOR) 20 MG tablet TAKE 1 TABLET BY MOUTH EVERY  DAY 90 tablet 1   0.9 %  sodium chloride infusion      No facility-administered medications prior to visit.    Allergies  Allergen Reactions   Shellfish Allergy Anaphylaxis   Codeine Nausea Only   Morphine Other (See Comments)    Other reaction(s): Other (See Comments), slurring words, slurring words   Morphine And Codeine Other (See Comments)    slurring words   Phenergan [Promethazine] Other (See Comments)    Causes blood presser to bottom out   Sulfa Antibiotics Rash and Nausea And Vomiting    Nausea    ROS    See HPI Objective:    Physical Exam Constitutional:      General: She is not in acute distress.    Appearance: Normal appearance. She is well-developed.  HENT:     Head: Normocephalic and atraumatic.     Right Ear: External ear normal.     Left Ear: External ear normal.  Eyes:     General: No scleral icterus. Neck:     Thyroid: No thyromegaly.  Cardiovascular:     Rate and Rhythm: Normal rate and regular rhythm.     Heart sounds: Normal heart sounds. No murmur heard. Pulmonary:     Effort: Pulmonary effort is normal. No respiratory distress.     Breath sounds: Normal breath sounds. No wheezing.  Musculoskeletal:        General: No swelling.     Cervical back: Neck supple.  Skin:    General: Skin is warm and dry.  Neurological:     Mental Status: She is alert and oriented to person, place, and time.  Psychiatric:        Mood and Affect: Mood normal.        Behavior: Behavior normal.         Thought Content: Thought content normal.        Judgment: Judgment normal.      BP 131/60 (BP Location: Right Arm, Patient Position: Sitting, Cuff Size: Normal)   Pulse (!) 55   Temp 98.6 F (37 C) (Oral)   Resp 16   Ht 5\' 7"  (1.702 m)   Wt 195 lb (88.5 kg)   SpO2 94%   BMI 30.54 kg/m  Wt Readings from Last 3 Encounters:  12/15/22 195 lb (88.5 kg)  11/02/22 197 lb (89.4 kg)  10/06/22 197 lb (89.4 kg)       Assessment & Plan:   Problem List Items Addressed This Visit       Unprioritized   Hypothyroid    Clinically stable on synthroid. Obtain follow up TSH.       Relevant Orders   TSH   Hyperlipidemia   Relevant Medications   atorvastatin (LIPITOR) 20 MG tablet   Essential hypertension     Well controlled on Amlodipine 5mg  daily. -Continue current medication regimen.      Relevant Medications   atorvastatin (LIPITOR) 20 MG tablet   Coagulation disorder (HCC)    Maintained on chronic coumadin.  Followed by coumadin clinic.      Class 1 obesity with body mass index (BMI) of 30.0 to 30.9 in adult     Difficulty losing weight despite diet and exercise. Discussed potential use of weight loss medication (Mounjaro). -Send prescription for [facility] to pharmacy and await insurance approval. -Check in 3-4 weeks after starting medication to assess response and side effects.      Relevant Medications   tirzepatide (ZEPBOUND) 2.5 MG/0.5ML Pen  Other Visit Diagnoses     Hyperglycemia    -  Primary   Relevant Orders   Comp Met (CMET)   HgB A1c       I have changed Kenedee M. Soutar's atorvastatin. I am also having her start on Zepbound. Additionally, I am having her maintain her ALPRAZolam, fluticasone, aspirin EC, Cyanocobalamin (VITAMIN B-12 PO), levothyroxine, omeprazole, EPINEPHrine, amLODipine, cholecalciferol, triamcinolone cream, erythromycin, SUMAtriptan, and warfarin. We will stop administering sodium chloride.  Meds ordered this encounter   Medications   atorvastatin (LIPITOR) 20 MG tablet    Sig: Take 1 tablet (20 mg total) by mouth daily.    Dispense:  90 tablet    Refill:  1   tirzepatide (ZEPBOUND) 2.5 MG/0.5ML Pen    Sig: Inject 2.5 mg into the skin once a week.    Dispense:  2 mL    Refill:  0    Order Specific Question:   Supervising Provider    Answer:   Danise Edge A [[id number]]

## 2022-12-15 NOTE — Assessment & Plan Note (Signed)
  Well controlled on Amlodipine 5mg  daily. -Continue current medication regimen.

## 2022-12-16 ENCOUNTER — Telehealth: Payer: Self-pay

## 2022-12-16 NOTE — Telephone Encounter (Signed)
PA initiated via [email or web address]. No ID number given. Awaiting determination.

## 2022-12-18 ENCOUNTER — Other Ambulatory Visit (HOSPITAL_COMMUNITY): Payer: Self-pay

## 2022-12-18 NOTE — Telephone Encounter (Signed)
Can you guys try to run a test claim for me? I haven't received a response for this PA. Thank you.

## 2022-12-18 NOTE — Telephone Encounter (Signed)
Tried to run test run with the insurance information in epic. Came back NON-MATCHED CARDHOLDER ID

## 2022-12-18 NOTE — Telephone Encounter (Signed)
Noted. Melinda Lopez- can you contact Pt and find out what her insurance is or if it's changed? We are not showing active pharmacy benefits currently.

## 2022-12-18 NOTE — Telephone Encounter (Signed)
Called but no answer lvm for patient to call back and provide insurance information

## 2022-12-23 ENCOUNTER — Telehealth: Payer: Self-pay | Admitting: *Deleted

## 2022-12-23 NOTE — Telephone Encounter (Signed)
Called pt since INR is due. She states she will go later today. Will follow up.

## 2022-12-24 ENCOUNTER — Ambulatory Visit (INDEPENDENT_AMBULATORY_CARE_PROVIDER_SITE_OTHER): Payer: BC Managed Care – PPO | Admitting: *Deleted

## 2022-12-24 DIAGNOSIS — G459 Transient cerebral ischemic attack, unspecified: Secondary | ICD-10-CM

## 2022-12-24 DIAGNOSIS — Z7901 Long term (current) use of anticoagulants: Secondary | ICD-10-CM

## 2022-12-24 DIAGNOSIS — D6859 Other primary thrombophilia: Secondary | ICD-10-CM

## 2022-12-24 LAB — PROTIME-INR
INR: 2.7 — ABNORMAL HIGH (ref 0.9–1.2)
Prothrombin Time: 27.8 s — ABNORMAL HIGH (ref 9.1–12.0)

## 2022-12-24 NOTE — Patient Instructions (Signed)
Description   RELEASE THE STANDING ORDER TO HAVE NEXT INR DRAWN. Spoke with pt and advised to continue taking 10mg  daily EXCEPT 5mg  on Monday, Wednesday, and Friday.  Stay consistent with greens each week (4-5 servings per week). Recheck INR in 6 weeks at [facility].  Coumadin Clinic [phone]

## 2022-12-28 ENCOUNTER — Encounter: Payer: Self-pay | Admitting: Cardiovascular Disease

## 2022-12-29 MED ORDER — CONTRAVE 8-90 MG PO TB12: ORAL_TABLET | ORAL | 0 refills | Status: DC

## 2022-12-29 NOTE — Addendum Note (Signed)
Addended by: [name] on: 12/29/2022 10:40 AM   Modules accepted: Orders

## 2023-01-12 ENCOUNTER — Telehealth: Payer: BC Managed Care – PPO | Admitting: Physician Assistant

## 2023-01-12 DIAGNOSIS — J019 Acute sinusitis, unspecified: Secondary | ICD-10-CM

## 2023-01-12 DIAGNOSIS — B9689 Other specified bacterial agents as the cause of diseases classified elsewhere: Secondary | ICD-10-CM

## 2023-01-12 MED ORDER — AMOXICILLIN-POT CLAVULANATE 875-125 MG PO TABS
1.0000 | ORAL_TABLET | Freq: Two times a day (BID) | ORAL | 0 refills | Status: DC
Start: 2023-01-12 — End: 2023-03-11

## 2023-01-12 NOTE — Progress Notes (Signed)

## 2023-01-12 NOTE — Progress Notes (Signed)
Message sent to patient requesting further input regarding current symptoms. Awaiting patient response.  

## 2023-01-12 NOTE — Progress Notes (Signed)
I have spent 5 minutes in review of e-visit questionnaire, review and updating patient chart, medical decision making and response to patient.   Mia Milan Cody Jacklynn Dehaas, PA-C    

## 2023-01-23 ENCOUNTER — Other Ambulatory Visit: Payer: Self-pay | Admitting: Family

## 2023-02-01 ENCOUNTER — Other Ambulatory Visit: Payer: Self-pay

## 2023-02-01 DIAGNOSIS — G459 Transient cerebral ischemic attack, unspecified: Secondary | ICD-10-CM

## 2023-02-01 DIAGNOSIS — D6859 Other primary thrombophilia: Secondary | ICD-10-CM

## 2023-02-01 DIAGNOSIS — Z7901 Long term (current) use of anticoagulants: Secondary | ICD-10-CM

## 2023-02-02 ENCOUNTER — Ambulatory Visit (INDEPENDENT_AMBULATORY_CARE_PROVIDER_SITE_OTHER): Payer: BC Managed Care – PPO | Admitting: *Deleted

## 2023-02-02 DIAGNOSIS — G459 Transient cerebral ischemic attack, unspecified: Secondary | ICD-10-CM | POA: Diagnosis not present

## 2023-02-02 DIAGNOSIS — D6859 Other primary thrombophilia: Secondary | ICD-10-CM | POA: Diagnosis not present

## 2023-02-02 DIAGNOSIS — Z7901 Long term (current) use of anticoagulants: Secondary | ICD-10-CM

## 2023-02-02 LAB — PROTIME-INR
INR: 2.6 — ABNORMAL HIGH (ref 0.9–1.2)
Prothrombin Time: 26.9 s — ABNORMAL HIGH (ref 9.1–12.0)

## 2023-02-02 NOTE — Patient Instructions (Signed)
 Description   RELEASE THE STANDING ORDER TO HAVE NEXT INR DRAWN. Spoke with pt and advised to continue taking 10mg  daily EXCEPT 5mg  on Monday, Wednesday, and Friday.  Stay consistent with greens each week (4-5 servings per week). Recheck INR in 6 weeks at [facility].  [facility] [phone]

## 2023-02-26 ENCOUNTER — Other Ambulatory Visit: Payer: Self-pay | Admitting: Family

## 2023-03-06 ENCOUNTER — Other Ambulatory Visit: Payer: Self-pay | Admitting: Family

## 2023-03-11 ENCOUNTER — Other Ambulatory Visit (HOSPITAL_BASED_OUTPATIENT_CLINIC_OR_DEPARTMENT_OTHER): Payer: Self-pay

## 2023-03-11 ENCOUNTER — Ambulatory Visit (INDEPENDENT_AMBULATORY_CARE_PROVIDER_SITE_OTHER): Payer: BC Managed Care – PPO | Admitting: Family Medicine

## 2023-03-11 ENCOUNTER — Encounter: Payer: Self-pay | Admitting: Family Medicine

## 2023-03-11 VITALS — BP 135/67 | HR 56 | Temp 97.9°F | Ht 67.0 in | Wt 194.0 lb

## 2023-03-11 DIAGNOSIS — R051 Acute cough: Secondary | ICD-10-CM

## 2023-03-11 DIAGNOSIS — J069 Acute upper respiratory infection, unspecified: Secondary | ICD-10-CM

## 2023-03-11 MED ORDER — BENZONATATE 200 MG PO CAPS
200.0000 mg | ORAL_CAPSULE | Freq: Two times a day (BID) | ORAL | 0 refills | Status: DC | PRN
Start: 2023-03-11 — End: 2023-07-14
  Filled 2023-03-11: qty 20, 10d supply, fill #0

## 2023-03-11 MED ORDER — DOXYCYCLINE HYCLATE 100 MG PO TABS
100.0000 mg | ORAL_TABLET | Freq: Two times a day (BID) | ORAL | 0 refills | Status: AC
Start: 2023-03-11 — End: 2023-03-18
  Filled 2023-03-11: qty 14, 7d supply, fill #0

## 2023-03-11 NOTE — Progress Notes (Signed)
 Acute Office Visit  Subjective:     Patient ID: Melinda Lopez, female    DOB: 1960-04-22, 63 y.o.   MRN: [id number]  CC: URI   HPI Patient is in today for URI symptoms.    Discussed the use of AI scribe software for clinical note transcription with the patient, who gave verbal consent to proceed.  History of Present Illness   Melinda Lopez is a 63 year old female who presents with respiratory symptoms and possible flu exposure.  She has been experiencing symptoms for approximately six days, starting with nausea and diarrhea, but she is unsure if this was related to current illness or something she ate. The following day, she developed a scratchy throat, right-sided headache, congestion, and a cough that has become more prominent and is sometimes productive with traces of green sputum. She experiences chills, fatigue, and body aches but continues to work despite these symptoms. She has been taking Tylenol  and ibuprofen, which may affect her temperature readings.  Her son recently had similar symptoms and tested positive for the flu. She attended a conference last week and babysat her granddaughter, which may have been potential exposure sources.  She has a history of frequent sinus infections and initially thought this might be another one, but the symptoms have not progressed to her usual sinus infection severity. She has been experiencing ear pain and a fluid sensation when changing positions, but no severe sinus pain or heavy nasal discharge.  She has not experienced further nausea, vomiting, or diarrhea since the initial episode. Her appetite is reduced.  She reports difficulty sleeping due to coughing and congestion, waking up frequently because of these symptoms. She has been using Flonase  without significant relief.          ROS All review of systems negative except what is listed in the HPI      Objective:    BP 135/67   Pulse (!) 56   Temp 97.9 F (36.6  C) (Oral)   Ht 5' 7 (1.702 m)   Wt 194 lb (88 kg)   SpO2 98%   BMI 30.38 kg/m    Physical Exam Vitals reviewed.  Constitutional:      Appearance: Normal appearance.  HENT:     Head: Normocephalic and atraumatic.     Right Ear: A middle ear effusion is present. Tympanic membrane is not erythematous.     Left Ear: A middle ear effusion is present. Tympanic membrane is not erythematous.     Nose: Congestion and rhinorrhea present.  Cardiovascular:     Rate and Rhythm: Normal rate and regular rhythm.  Pulmonary:     Effort: Pulmonary effort is normal.     Breath sounds: Normal breath sounds. No wheezing, rhonchi or rales.  Lymphadenopathy:     Cervical: No cervical adenopathy.  Skin:    General: Skin is warm and dry.  Neurological:     Mental Status: She is alert and oriented to person, place, and time.  Psychiatric:        Mood and Affect: Mood normal.        Behavior: Behavior normal.        Thought Content: Thought content normal.        Judgment: Judgment normal.     No results found for any visits on 03/11/23.      Assessment & Plan:   Problem List Items Addressed This Visit   None Visit Diagnoses  Viral URI    -  Primary   Relevant Medications   benzonatate  (TESSALON ) 200 MG capsule   doxycycline  (VIBRA -TABS) 100 MG tablet   Other Relevant Orders   COVID-19, Flu A+B and RSV     Acute cough       Relevant Orders   POCT Influenza A/B      Flu testing negative. We can send out combo swab to rule out COVID/RSV as well. Adding Tessalon  for cough  Likely another Viral Upper Respiratory Infection  Continue supportive measures including rest, hydration, humidifier use, steam showers, warm compresses to sinuses, warm liquids with lemon and honey, and over-the-counter cough, cold, and analgesics as needed. If symptoms persist 8-10 days, become severe, or return after a few days of feeling better, then you can start the watch-and-wait script for  doxycycline .       Meds ordered this encounter  Medications   benzonatate  (TESSALON ) 200 MG capsule    Sig: Take 1 capsule (200 mg total) by mouth 2 (two) times daily as needed for cough.    Dispense:  20 capsule    Refill:  0    Supervising Provider:   DOMENICA BLACKBIRD A [[id number]]   doxycycline  (VIBRA -TABS) 100 MG tablet    Sig: Take 1 tablet (100 mg total) by mouth 2 (two) times daily for 7 days.    Dispense:  14 tablet    Refill:  0    Supervising Provider:   DOMENICA BLACKBIRD A [[id number]]    Return if symptoms worsen or fail to improve.  Waddell KATHEE Mon, NP

## 2023-03-11 NOTE — Patient Instructions (Addendum)
 Flu testing negative. We can send out combo swab to rule out COVID/RSV as well. Adding Tessalon  for cough  Likely another Viral Upper Respiratory Infection  Continue supportive measures including rest, hydration, humidifier use, steam showers, warm compresses to sinuses, warm liquids with lemon and honey, and over-the-counter cough, cold, and analgesics as needed. If symptoms persist 8-10 days, become severe, or return after a few days of feeling better, then you can start the watch-and-wait script for doxycycline .   Over the counter medications that may be helpful for symptoms:  Guaifenesin  1200 mg extended release tabs twice daily, with plenty of water For cough and congestion Brand name: Mucinex    Pseudoephedrine 30 mg, one or two tabs every 4 to 6 hours For sinus congestion Brand name: Sudafed You must get this from the pharmacy counter.  Oxymetazoline nasal spray each morning, one spray in each nostril, for NO MORE THAN 3 days  For nasal and sinus congestion Brand name: Afrin Saline nasal spray or Saline Nasal Irrigation (Netti Pot, etc) 3-5 times a day For nasal and sinus congestion Brand names: Ocean or AYR Fluticasone  nasal spray OR Mometasone  nasal spray OR Triamcinolone  Acetonide nasal spray - follow directions on the packaging For nasal and sinus congestion Brand name: Flonase , Nasonex , Nasacort  Warm salt water gargles  For sore throat Every few hours as needed Alternate ibuprofen 400-600 mg and acetaminophen  1000 mg every 6 hours For fever, body aches, headache Brand names: Motrin or Advil and Tylenol  Dextromethorphan 12-hour cough version 30 mg every 12 hours  For cough Brand name: Delsym Stop all other cold medications for now (Nyquil, Dayquil, Tylenol  Cold, Theraflu, etc) and other non-prescription cough/cold preparations. Many of these have the same ingredients listed above and could cause an overdose of medication.   Herbal treatments that have been shown to be  helpful in some patients include: Vitamin C 1000 mg per day Zinc 100 mg per day Quercetin 25-500 mg twice a day Melatonin 5-10mg  at bedtime Honey Green Tea  General Instructions Allow your body to rest Drink PLENTY of fluids Typically, we are the most contagious 1-2 days before symptoms start through the first 2-3 days of most severe symptoms. Per CDC guidelines, you can return to school/work when symptoms have started to improve and you have been fever-free for 24 hours. However, recommend you continue extra precautions for the following 5 days (frequent hand hygiene, masking, covering coughs/sneezes, minimize exposure to immunocompromised individuals, etc).  If you develop severe shortness of breath, uncontrolled fevers, coughing up blood, confusion, chest pain, or signs of dehydration (such as significantly decreased urine amounts or dizziness with standing) please go to the nearest ER.

## 2023-03-12 ENCOUNTER — Encounter: Payer: Self-pay | Admitting: Family Medicine

## 2023-03-12 LAB — POCT INFLUENZA A/B
Influenza A, POC: NEGATIVE
Influenza B, POC: NEGATIVE

## 2023-03-12 LAB — COVID-19, FLU A+B AND RSV
Influenza A, NAA: NOT DETECTED
Influenza B, NAA: NOT DETECTED
RSV, NAA: NOT DETECTED
SARS-CoV-2, NAA: NOT DETECTED

## 2023-03-15 ENCOUNTER — Telehealth: Payer: Self-pay

## 2023-03-15 NOTE — Telephone Encounter (Signed)
Lpm to have INR drawn today. 

## 2023-03-17 ENCOUNTER — Ambulatory Visit (INDEPENDENT_AMBULATORY_CARE_PROVIDER_SITE_OTHER): Payer: BC Managed Care – PPO | Admitting: *Deleted

## 2023-03-17 DIAGNOSIS — D6859 Other primary thrombophilia: Secondary | ICD-10-CM

## 2023-03-17 DIAGNOSIS — Z7901 Long term (current) use of anticoagulants: Secondary | ICD-10-CM

## 2023-03-17 DIAGNOSIS — G459 Transient cerebral ischemic attack, unspecified: Secondary | ICD-10-CM | POA: Diagnosis not present

## 2023-03-17 LAB — PROTIME-INR
INR: 1.7 — ABNORMAL HIGH (ref 0.9–1.2)
Prothrombin Time: 18.2 s — ABNORMAL HIGH (ref 9.1–12.0)

## 2023-03-17 NOTE — Patient Instructions (Signed)
Description   RELEASE THE STANDING ORDER TO HAVE NEXT INR DRAWN. Spoke with pt and advised to take 10mg  of warfarin today and then continue taking 10mg  daily EXCEPT 5mg  on Monday, Wednesday, and Friday.  Stay consistent with greens each week (4-5 servings per week). Recheck INR in 3 weeks at [facility].  [facility] [phone]

## 2023-04-06 ENCOUNTER — Telehealth: Payer: Self-pay | Admitting: *Deleted

## 2023-04-06 NOTE — Telephone Encounter (Signed)
 Called pt to inquire if she had gone to the lab today; there was no answer so left her a message to update [place] by calling us at [phone]. Will await a call back or message regarding this and follow up.

## 2023-04-08 ENCOUNTER — Ambulatory Visit (INDEPENDENT_AMBULATORY_CARE_PROVIDER_SITE_OTHER): Payer: Self-pay

## 2023-04-08 DIAGNOSIS — G459 Transient cerebral ischemic attack, unspecified: Secondary | ICD-10-CM

## 2023-04-08 DIAGNOSIS — Z5181 Encounter for therapeutic drug level monitoring: Secondary | ICD-10-CM | POA: Diagnosis not present

## 2023-04-08 DIAGNOSIS — D6859 Other primary thrombophilia: Secondary | ICD-10-CM

## 2023-04-08 DIAGNOSIS — Z7901 Long term (current) use of anticoagulants: Secondary | ICD-10-CM

## 2023-04-08 LAB — PROTIME-INR
INR: 2 — ABNORMAL HIGH (ref 0.9–1.2)
Prothrombin Time: 21.3 s — ABNORMAL HIGH (ref 9.1–12.0)

## 2023-04-08 NOTE — Patient Instructions (Signed)
 Description   RELEASE THE STANDING ORDER TO HAVE NEXT INR DRAWN. Spoke with pt and advised to take 15mg  of warfarin today and then continue taking 10mg  daily EXCEPT 5mg  on Monday, Wednesday, and Friday.  Stay consistent with greens each week (4-5 servings per week). Recheck INR in 4 weeks at [facility].  [facility] [phone]

## 2023-05-05 ENCOUNTER — Telehealth: Payer: Self-pay | Admitting: *Deleted

## 2023-05-05 NOTE — Telephone Encounter (Signed)
 Called pt since INR is overdue; there was no answer so left a message to call back with an update.

## 2023-05-07 ENCOUNTER — Other Ambulatory Visit: Payer: Self-pay

## 2023-05-07 ENCOUNTER — Telehealth: Admitting: Physician Assistant

## 2023-05-07 ENCOUNTER — Ambulatory Visit (INDEPENDENT_AMBULATORY_CARE_PROVIDER_SITE_OTHER)

## 2023-05-07 DIAGNOSIS — D6859 Other primary thrombophilia: Secondary | ICD-10-CM

## 2023-05-07 DIAGNOSIS — J019 Acute sinusitis, unspecified: Secondary | ICD-10-CM

## 2023-05-07 DIAGNOSIS — Z5181 Encounter for therapeutic drug level monitoring: Secondary | ICD-10-CM | POA: Diagnosis not present

## 2023-05-07 DIAGNOSIS — B9689 Other specified bacterial agents as the cause of diseases classified elsewhere: Secondary | ICD-10-CM | POA: Diagnosis not present

## 2023-05-07 DIAGNOSIS — G459 Transient cerebral ischemic attack, unspecified: Secondary | ICD-10-CM

## 2023-05-07 DIAGNOSIS — Z7901 Long term (current) use of anticoagulants: Secondary | ICD-10-CM

## 2023-05-07 LAB — PROTIME-INR
INR: 2.7 — ABNORMAL HIGH (ref 0.9–1.2)
Prothrombin Time: 28 s — ABNORMAL HIGH (ref 9.1–12.0)

## 2023-05-07 MED ORDER — AMOXICILLIN-POT CLAVULANATE 875-125 MG PO TABS
1.0000 | ORAL_TABLET | Freq: Two times a day (BID) | ORAL | 0 refills | Status: DC
Start: 2023-05-07 — End: 2023-07-14

## 2023-05-07 NOTE — Progress Notes (Signed)
 E-Visit for Sinus Problems  We are sorry that you are not feeling well.  Here is how we plan to help!  Based on what you have shared with me it looks like you have sinusitis.  Sinusitis is inflammation and infection in the sinus cavities of the head.  Based on your presentation I believe you most likely have Acute Bacterial Sinusitis.  This is an infection caused by bacteria and is treated with antibiotics. I have prescribed Augmentin 875mg /125mg  one tablet twice daily with food, for 7 days. You may use an oral decongestant such as Mucinex D or if you have glaucoma or high blood pressure use plain Mucinex. Saline nasal spray help and can safely be used as often as needed for congestion.  If you develop worsening sinus pain, fever or notice severe headache and vision changes, or if symptoms are not better after completion of antibiotic, please schedule an appointment with a health care provider.    Sinus infections are not as easily transmitted as other respiratory infection, however we still recommend that you avoid close contact with loved ones, especially the very young and elderly.  Remember to wash your hands thoroughly throughout the day as this is the number one way to prevent the spread of infection!  Antibiotics do increase the bleed risk of Warfarin. It is recommended for you to have a follow up with whomever checks your INR level within the same week when completing the antibiotic to make sure your Warfarin dose does not need to be acutely adjusted.  Home Care: Only take medications as instructed by your medical team. Complete the entire course of an antibiotic. Do not take these medications with alcohol. A steam or ultrasonic humidifier can help congestion.  You can place a towel over your head and breathe in the steam from hot water coming from a faucet. Avoid close contacts especially the very young and the elderly. Cover your mouth when you cough or sneeze. Always remember to wash your  hands.  Get Help Right Away If: You develop worsening fever or sinus pain. You develop a severe head ache or visual changes. Your symptoms persist after you have completed your treatment plan.  Make sure you Understand these instructions. Will watch your condition. Will get help right away if you are not doing well or get worse.  Thank you for choosing an e-visit.  Your e-visit answers were reviewed by a board certified advanced clinical practitioner to complete your personal care plan. Depending upon the condition, your plan could have included both over the counter or prescription medications.  Please review your pharmacy choice. Make sure the pharmacy is open so you can pick up prescription now. If there is a problem, you may contact your provider through [employer] and have the prescription routed to another pharmacy.  Your safety is important to [place]. If you have drug allergies check your prescription carefully.   For the next 24 hours you can use MyChart to ask questions about today's visit, request a non-urgent call back, or ask for a work or school excuse. You will get an email in the next two days asking about your experience. I hope that your e-visit has been valuable and will speed your recovery.   I have spent 5 minutes in review of e-visit questionnaire, review and updating patient chart, medical decision making and response to patient.   Margaretann Loveless, PA-C

## 2023-05-07 NOTE — Patient Instructions (Signed)
 Description   RELEASE THE STANDING ORDER TO HAVE NEXT INR DRAWN. Spoke with pt and advised to continue taking 10mg  daily EXCEPT 5mg  on Monday, Wednesday, and Friday.  Stay consistent with greens each week (4-5 servings per week). Recheck INR in 5 weeks at [facility].  [facility] [phone]

## 2023-05-12 NOTE — Telephone Encounter (Signed)
 This encounter was created in error - please disregard.

## 2023-06-02 ENCOUNTER — Other Ambulatory Visit: Payer: Self-pay | Admitting: Family

## 2023-06-02 DIAGNOSIS — E785 Hyperlipidemia, unspecified: Secondary | ICD-10-CM

## 2023-06-09 ENCOUNTER — Telehealth: Payer: Self-pay | Admitting: *Deleted

## 2023-06-09 NOTE — Telephone Encounter (Signed)
 Called pt since INR is overdue; there was no answer so left her a message to call back regarding this.

## 2023-06-10 ENCOUNTER — Ambulatory Visit (INDEPENDENT_AMBULATORY_CARE_PROVIDER_SITE_OTHER): Admitting: *Deleted

## 2023-06-10 ENCOUNTER — Other Ambulatory Visit: Payer: Self-pay | Admitting: *Deleted

## 2023-06-10 DIAGNOSIS — Z7901 Long term (current) use of anticoagulants: Secondary | ICD-10-CM | POA: Diagnosis not present

## 2023-06-10 DIAGNOSIS — G459 Transient cerebral ischemic attack, unspecified: Secondary | ICD-10-CM | POA: Diagnosis not present

## 2023-06-10 DIAGNOSIS — D6859 Other primary thrombophilia: Secondary | ICD-10-CM | POA: Diagnosis not present

## 2023-06-10 DIAGNOSIS — Z5181 Encounter for therapeutic drug level monitoring: Secondary | ICD-10-CM

## 2023-06-10 LAB — PROTIME-INR
INR: 3.2 — ABNORMAL HIGH (ref 0.9–1.2)
Prothrombin Time: 33.2 s — ABNORMAL HIGH (ref 9.1–12.0)

## 2023-06-10 MED ORDER — WARFARIN SODIUM 10 MG PO TABS: ORAL_TABLET | ORAL | 1 refills | Status: DC

## 2023-06-10 NOTE — Telephone Encounter (Signed)
 Warfarin 10mg  refill Antiphospholipid syndrome Last INR 06/10/23 Last OV 11/02/22

## 2023-06-10 NOTE — Patient Instructions (Addendum)
 Description   RELEASE THE STANDING ORDER TO HAVE NEXT INR DRAWN. Spoke with pt and advised to take 1/2 tablet of warfarin today then continue taking 10mg  daily EXCEPT 5mg  on Monday, Wednesday, and Friday.  Stay consistent with greens each week (4-5 servings per week). Recheck INR in 4 weeks at [facility].  [facility] [phone]

## 2023-06-21 ENCOUNTER — Other Ambulatory Visit: Payer: Self-pay | Admitting: Family

## 2023-06-21 DIAGNOSIS — Z1231 Encounter for screening mammogram for malignant neoplasm of breast: Secondary | ICD-10-CM

## 2023-06-28 ENCOUNTER — Other Ambulatory Visit: Payer: Self-pay | Admitting: Family

## 2023-06-29 ENCOUNTER — Encounter: Payer: Self-pay | Admitting: Family

## 2023-06-29 DIAGNOSIS — I10 Essential (primary) hypertension: Secondary | ICD-10-CM

## 2023-06-29 DIAGNOSIS — E785 Hyperlipidemia, unspecified: Secondary | ICD-10-CM

## 2023-06-29 DIAGNOSIS — E039 Hypothyroidism, unspecified: Secondary | ICD-10-CM

## 2023-06-29 DIAGNOSIS — R739 Hyperglycemia, unspecified: Secondary | ICD-10-CM

## 2023-07-01 ENCOUNTER — Other Ambulatory Visit

## 2023-07-06 ENCOUNTER — Other Ambulatory Visit

## 2023-07-07 ENCOUNTER — Telehealth: Payer: Self-pay | Admitting: *Deleted

## 2023-07-07 DIAGNOSIS — Z8679 Personal history of other diseases of the circulatory system: Secondary | ICD-10-CM

## 2023-07-07 DIAGNOSIS — Z5181 Encounter for therapeutic drug level monitoring: Secondary | ICD-10-CM

## 2023-07-07 NOTE — Telephone Encounter (Signed)
 Called pt to inquire if she had gone to the lab today for INR check; there was no answer so left her a message.   Placed an order and released

## 2023-07-08 ENCOUNTER — Ambulatory Visit (INDEPENDENT_AMBULATORY_CARE_PROVIDER_SITE_OTHER): Payer: Self-pay

## 2023-07-08 DIAGNOSIS — Z8679 Personal history of other diseases of the circulatory system: Secondary | ICD-10-CM | POA: Diagnosis not present

## 2023-07-08 DIAGNOSIS — Z5181 Encounter for therapeutic drug level monitoring: Secondary | ICD-10-CM

## 2023-07-08 LAB — PROTIME-INR
INR: 2.6 — ABNORMAL HIGH (ref 0.9–1.2)
Prothrombin Time: 27.4 s — ABNORMAL HIGH (ref 9.1–12.0)

## 2023-07-08 NOTE — Patient Instructions (Signed)
 Description   RELEASE THE STANDING ORDER TO HAVE NEXT INR DRAWN. Spoke with pt and advised to continue taking 10mg  daily EXCEPT 5mg  on Monday, Wednesday, and Friday.  Stay consistent with greens each week (4-5 servings per week). Recheck INR in 5 weeks at [facility].  [facility] [phone]

## 2023-07-14 ENCOUNTER — Ambulatory Visit (INDEPENDENT_AMBULATORY_CARE_PROVIDER_SITE_OTHER): Admitting: Family

## 2023-07-14 VITALS — BP 139/66 | HR 65 | Temp 98.7°F | Resp 16 | Ht 67.0 in | Wt 191.0 lb

## 2023-07-14 DIAGNOSIS — E559 Vitamin D deficiency, unspecified: Secondary | ICD-10-CM | POA: Diagnosis not present

## 2023-07-14 DIAGNOSIS — E785 Hyperlipidemia, unspecified: Secondary | ICD-10-CM

## 2023-07-14 DIAGNOSIS — G43909 Migraine, unspecified, not intractable, without status migrainosus: Secondary | ICD-10-CM

## 2023-07-14 DIAGNOSIS — K219 Gastro-esophageal reflux disease without esophagitis: Secondary | ICD-10-CM

## 2023-07-14 DIAGNOSIS — E039 Hypothyroidism, unspecified: Secondary | ICD-10-CM | POA: Diagnosis not present

## 2023-07-14 DIAGNOSIS — Z0000 Encounter for general adult medical examination without abnormal findings: Secondary | ICD-10-CM | POA: Diagnosis not present

## 2023-07-14 DIAGNOSIS — I10 Essential (primary) hypertension: Secondary | ICD-10-CM | POA: Diagnosis not present

## 2023-07-14 DIAGNOSIS — F40243 Fear of flying: Secondary | ICD-10-CM

## 2023-07-14 DIAGNOSIS — R739 Hyperglycemia, unspecified: Secondary | ICD-10-CM

## 2023-07-14 DIAGNOSIS — Z91013 Allergy to seafood: Secondary | ICD-10-CM

## 2023-07-14 DIAGNOSIS — D689 Coagulation defect, unspecified: Secondary | ICD-10-CM

## 2023-07-14 MED ORDER — EPINEPHRINE 0.3 MG/0.3ML IJ SOAJ: 0.3000 mg | INTRAMUSCULAR | 2 refills | Status: AC | PRN

## 2023-07-14 MED ORDER — LEVOTHYROXINE SODIUM 100 MCG PO TABS: 100.0000 ug | ORAL_TABLET | Freq: Every day | ORAL | 1 refills | Status: DC

## 2023-07-14 MED ORDER — AMLODIPINE BESYLATE 5 MG PO TABS: 5.0000 mg | ORAL_TABLET | Freq: Every day | ORAL | 1 refills | Status: AC

## 2023-07-14 NOTE — Assessment & Plan Note (Signed)
 Continue statin.  Lab Results  Component Value Date   CHOL 137 06/12/2022   HDL 57.00 06/12/2022   LDLCALC 69 06/12/2022   TRIG 54.0 06/12/2022   CHOLHDL 2 06/12/2022   Update lipid panel

## 2023-07-14 NOTE — Assessment & Plan Note (Signed)
  Up to date on tetanus immunization. Candidate for pneumonia vaccine, currently out of stock. Routine screenings up to date. Mammogram scheduled for June 19. Vision and dental appointments needed. - Advise obtaining pneumonia vaccine when available. - Schedule vision and dental appointments. - Complete mammogram on June 19.

## 2023-07-14 NOTE — Assessment & Plan Note (Signed)
  Blood pressure well-controlled at home and during insurance checkup. Slightly elevated today, likely due to white coat syndrome. - Continue current blood pressure management. - Re-evaluate blood pressure in 6 months.

## 2023-07-14 NOTE — Assessment & Plan Note (Signed)
 Rare use of prn xanax

## 2023-07-14 NOTE — Patient Instructions (Signed)
 VISIT SUMMARY:  Today, you had your annual physical exam. Your blood pressure was slightly elevated, but it is well-controlled at home. We discussed your migraines, thyroid  function, cholesterol levels, reflux, and shellfish allergy. You are up to date on most of your routine screenings, and we planned for a follow-up in 6 months.  YOUR PLAN:  MIGRAINE: You have a history of migraines and use Imitrex  as needed. -Continue using Imitrex  as needed. -Check if your Imitrex  is expired and use available refills if necessary.  HYPERTENSION: Your blood pressure is well-controlled at home but was slightly elevated today, likely due to white coat syndrome. -Continue your current blood pressure management. -We will re-evaluate your blood pressure in 6 months.  HYPOTHYROIDISM: You are on levothyroxine  for thyroid  function, and your thyroid  levels need to be checked. -We will order thyroid  function tests. -Continue taking levothyroxine  but hold off on picking up the refill until we review your thyroid  levels.  HYPERLIPIDEMIA: Your cholesterol levels are being monitored, and you are on atorvastatin . -We will order a cholesterol panel. -Continue taking atorvastatin .  GASTROESOPHAGEAL REFLUX DISEASE (GERD): Your reflux symptoms are well-managed with omeprazole . -Continue taking omeprazole .  SHELLFISH ALLERGY: You have a shellfish allergy and need an updated EpiPen . -We will prescribe an updated EpiPen .  GENERAL HEALTH MAINTENANCE: You are up to date on your tetanus immunization and routine screenings. You need a pneumonia vaccine, vision, and dental appointments. -Obtain the pneumonia vaccine when it is available. -Schedule vision and dental appointments. -Complete your mammogram on June 19.  FOLLOW-UP: Routine evaluation in 6 months. -Schedule a follow-up appointment in 6 months.

## 2023-07-14 NOTE — Assessment & Plan Note (Signed)
 Clinically stable on synthroid, update TSH.

## 2023-07-14 NOTE — Assessment & Plan Note (Signed)
  Cholesterol levels monitored. On atorvastatin  until October. Fasting not required for testing. - Order cholesterol panel. - Continue atorvastatin .

## 2023-07-14 NOTE — Assessment & Plan Note (Signed)
  Uses Imitrex  as needed. Insurance issues prevent use of a previously recommended medication. - Continue Imitrex  as needed. - Check if Imitrex  is expired and use available refills if necessary.

## 2023-07-14 NOTE — Progress Notes (Signed)
 Uses Imitrex  as needed. Insurance issues prevent use of a previously recommended medication. - Continue Imitrex  as needed. - Check if Imitrex  is expired and use available refills if necessary.  Subjective:     Patient ID: Melinda Lopez, female    DOB: 03/07/1960, 63 y.o.   MRN: [id number]  Chief Complaint  Patient presents with   Annual Exam    HPI  Discussed the use of AI scribe software for clinical note transcription with the patient, who gave verbal consent to proceed.  History of Present Illness  Melinda Lopez is a 62 year old female who presents for an annual physical exam.  Her blood pressure is well-controlled at home but was slightly elevated today. It was normal during a recent insurance checkup. She has a history of migraines and has Imitrex  available with refills until July 30th. She has not started a new medication due to insurance issues and has not experienced recent migraines. She uses triamcinolone  cream occasionally for a heat rash during air travel. Her reflux is stable with omeprazole . She takes levothyroxine  and is due for a thyroid  level check. She uses Xanax  as needed for flying and is unsure about needing a refill. She has a shellfish allergy and does not have an updated EpiPen . She denies current cough, cold symptoms, hearing or vision issues, leg swelling, constipation, diarrhea, urinary concerns, unusual muscle or joint pain, or concerns about depression or anxiety.  Immunizations:  prevnar next visit when in stock. Diet: healthy Wt Readings from Last 3 Encounters:  07/14/23 191 lb (86.6 kg)  03/11/23 194 lb (88 kg)  12/15/22 195 lb (88.5 kg)  Exercise: enjoys elliptical and walking Colonoscopy: 2019 due 2029 Pap Smear: good until 2029 Mammogram: scheduled Vision: due Dental: due  Lab Results  Component Value Date   HGBA1C 5.7 06/12/2022      Health Maintenance Due  Topic Date Due   Pneumococcal Vaccine 65-31 Years old (1 of 2 - PCV)  Never done   COVID-19 Vaccine (4 - 2024-25 season) 10/04/2022    Past Medical History:  Diagnosis Date   Carotid artery stenosis    Chronic headaches    Fatty liver 04/07/2016   Glaucoma    Hx of adenomatous polyp of colon 07/30/2011   Hx of blood clots    fingers   Hyperlipemia    Hypertension    Hypothyroidism    Migraine 1982   pt states migraines since then.    PFO (patent foramen ovale)    TIA (transient ischemic attack)     Past Surgical History:  Procedure Laterality Date   BREAST LUMPECTOMY Right 2000   CAROTID ARTERY - SUBCLAVIAN ARTERY BYPASS GRAFT Right 2006   COLONOSCOPY     UPPER GASTROINTESTINAL ENDOSCOPY      Family History  Problem Relation Age of Onset   Heart disease Mother        Vascular disease early 35s   Kidney disease Mother    Heart disease Father    Diabetes Sister    Kidney disease Sister    Heart murmur Brother    Heart disease Maternal Grandmother     Social History   Socioeconomic History   Marital status: Married    Spouse name: Not on file   Number of children: 2   Years of education: Not on file   Highest education level: Not on file  Occupational History   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  Tobacco Use   Smoking  status: Former    Current packs/day: 0.00    Types: Cigarettes    Quit date: 07/29/2002    Years since quitting: 20.9   Smokeless tobacco: Never  Vaping Use   Vaping status: Never Used  Substance and Sexual Activity   Alcohol use: Yes    Comment: 4 a week   Drug use: No   Sexual activity: Yes    Partners: Male  Other Topics Concern   Not on file  Social History Narrative   Former smoker- quit in the 34's   Married    2 children: 1982- daughter Corporate treasurer- lives in [place] Sammie Crigler- lives locally   2 grandchildren in [place]   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   Social Drivers of Health   Financial Resource Strain: Patient Declined (03/10/2023)    Overall Financial Resource Strain (CARDIA)    Difficulty of Paying Living Expenses: Patient declined  Food Insecurity: Patient Declined (03/10/2023)   Hunger Vital Sign    Worried About Running Out of Food in the Last Year: Patient declined    Ran Out of Food in the Last Year: Patient declined  Transportation Needs: Patient Declined (03/10/2023)   PRAPARE - Administrator, Civil Service (Medical): Patient declined    Lack of Transportation (Non-Medical): Patient declined  Physical Activity: Insufficiently Active (03/10/2023)   Exercise Vital Sign    Days of Exercise per Week: 3 days    Minutes of Exercise per Session: 40 min  Stress: No Stress Concern Present (03/10/2023)   [employer] of Occupational Health - Occupational Stress Questionnaire    Feeling of Stress : Only a little  Social Connections: Unknown (03/10/2023)   Social Connection and Isolation Panel [NHANES]    Frequency of Communication with Friends and Family: Patient declined    Frequency of Social Gatherings with Friends and Family: Patient declined    Attends Religious Services: Patient declined    Database administrator or Organizations: Patient declined    Attends Engineer, structural: Not on file    Marital Status: Patient declined  Intimate Partner Violence: Not on file    Outpatient Medications Prior to Visit  Medication Sig Dispense Refill   ALPRAZolam  (XANAX ) 0.5 MG tablet Take 1/2-1 tablet by mouth prior to flying. 20 tablet 0   aspirin EC 81 MG tablet Take 81 mg by mouth daily. Swallow whole.     atorvastatin  (LIPITOR ) 20 MG tablet TAKE 1 TABLET BY MOUTH EVERY DAY 90 tablet 1   Cyanocobalamin (VITAMIN B-12 PO) Take 1 tablet by mouth daily.     erythromycin  ophthalmic ointment PLACE 1 APPLICATION INTO BOTH EYES 3 (THREE) TIMES DAILY. 3.5 g 0   fluticasone  (FLONASE ) 50 MCG/ACT nasal spray PLACE 1 SPRAY INTO BOTH NOSTRILS 2 (TWO) TIMES DAILY 48 mL 2   omeprazole  (PRILOSEC) 40 MG capsule TAKE 1  CAPSULE (40 MG TOTAL) BY MOUTH DAILY. 90 capsule 1   SUMAtriptan  (IMITREX ) 50 MG tablet MAY REPEAT IN 2 HOURS IF HEADACHE PERSISTS OR RECURS. 10 tablet 2   triamcinolone  cream (KENALOG ) 0.1 % APPLY 1 APPLICATION TOPICALLY TWICE A DAY AS NEEDED 30 g 0   warfarin (COUMADIN ) 10 MG tablet TAKE 1/2 TABLET TO 1 TABLET BY MOUTH DAILY AS DIRECTED BY COUMADIN  CLINIC 90 tablet 1   amLODipine  (NORVASC ) 5 MG tablet TAKE 1 TABLET (5 MG TOTAL) BY MOUTH DAILY. 90 tablet 1   benzonatate  (TESSALON ) 200 MG capsule Take 1 capsule (200 mg  total) by mouth 2 (two) times daily as needed for cough. 20 capsule 0   EPINEPHRINE  0.3 mg/0.3 mL IJ SOAJ injection Inject 0.3 mg into the muscle as needed for anaphylaxis. Use as needed for severe allergic reaction 2 each 2   levothyroxine  (SYNTHROID ) 100 MCG tablet TAKE 1 TABLET BY MOUTH EVERY DAY DUE 06/21/21 90 tablet 1   amoxicillin -clavulanate (AUGMENTIN ) 875-125 MG tablet Take 1 tablet by mouth 2 (two) times daily. 14 tablet 0   cholecalciferol (VITAMIN D3) 25 MCG (1000 UNIT) tablet Take 3,000 Units by mouth daily.     No facility-administered medications prior to visit.    Allergies  Allergen Reactions   Shellfish Allergy Anaphylaxis   Codeine Nausea Only   Morphine Other (See Comments)    Other reaction(s): Other (See Comments), slurring words, slurring words   Morphine And Codeine Other (See Comments)    slurring words   Phenergan [Promethazine] Other (See Comments)    Causes blood presser to bottom out   Sulfa Antibiotics Rash and Nausea And Vomiting    Nausea    Review of Systems  Constitutional:  Negative for weight loss.  HENT:  Negative for congestion and hearing loss.   Eyes:  Negative for blurred vision.  Respiratory:  Negative for cough.   Cardiovascular:  Negative for leg swelling.  Gastrointestinal:  Negative for constipation and diarrhea.  Genitourinary:  Negative for dysuria, frequency and hematuria.  Musculoskeletal:  Negative for joint pain  and myalgias.  Skin:  Negative for rash.  Neurological:  Negative for headaches.  Psychiatric/Behavioral:         Denies depression/anxiety       Objective:     Physical Exam   BP 139/66 (BP Location: Right Arm, Patient Position: Sitting, Cuff Size: Large)   Pulse 65   Temp 98.7 F (37.1 C) (Oral)   Resp 16   Ht 5' 7 (1.702 m)   Wt 191 lb (86.6 kg)   SpO2 97%   BMI 29.91 kg/m  Wt Readings from Last 3 Encounters:  07/14/23 191 lb (86.6 kg)  03/11/23 194 lb (88 kg)  12/15/22 195 lb (88.5 kg)  Physical Exam  Constitutional: She is oriented to person, place, and time. She appears well-developed and well-nourished. No distress.  HENT:  Head: Normocephalic and atraumatic.  Right Ear: Tympanic membrane and ear canal normal.  Left Ear: Tympanic membrane and ear canal normal.  Mouth/Throat: Oropharynx is clear and moist.  Eyes: Pupils are equal, round, and reactive to light. No scleral icterus.  Neck: Normal range of motion. No thyromegaly present.  Cardiovascular: Normal rate and regular rhythm.   No murmur heard. Pulmonary/Chest: Effort normal and breath sounds normal. No respiratory distress. He has no wheezes. She has no rales. She exhibits no tenderness.  Abdominal: Soft. Bowel sounds are normal. She exhibits no distension and no mass. There is no tenderness. There is no rebound and no guarding.  Musculoskeletal: She exhibits no edema.  Lymphadenopathy:    She has no cervical adenopathy.  Neurological: She is alert and oriented to person, place, and time. She has normal patellar reflexes. She exhibits normal muscle tone. Coordination normal.  Skin: Skin is warm and dry.  Psychiatric: She has a normal mood and affect. Her behavior is normal. Judgment and thought content normal.  Breast/Pelvic: deferred        Assessment & Plan:        Assessment & Plan:   Problem List Items Addressed This Visit  Unprioritized   Vitamin D  deficiency   Relevant Orders    Vitamin D  (25 hydroxy)   Preventative health care - Primary    Up to date on tetanus immunization. Candidate for pneumonia vaccine, currently out of stock. Routine screenings up to date. Mammogram scheduled for June 19. Vision and dental appointments needed. - Advise obtaining pneumonia vaccine when available. - Schedule vision and dental appointments. - Complete mammogram on June 19.      Migraine without status migrainosus, not intractable    Uses Imitrex  as needed. Insurance issues prevent use of a previously recommended medication. - Continue Imitrex  as needed. - Check if Imitrex  is expired and use available refills if necessary.      Relevant Medications   amLODipine  (NORVASC ) 5 MG tablet   EPINEPHRINE  0.3 mg/0.3 mL IJ SOAJ injection   Hypothyroid   Clinically stable on synthroid , update TSH.      Relevant Medications   levothyroxine  (SYNTHROID ) 100 MCG tablet   Other Relevant Orders   TSH   Hyperlipidemia   Continue statin.  Lab Results  Component Value Date   CHOL 137 06/12/2022   HDL 57.00 06/12/2022   LDLCALC 69 06/12/2022   TRIG 54.0 06/12/2022   CHOLHDL 2 06/12/2022   Update lipid panel      Relevant Medications   amLODipine  (NORVASC ) 5 MG tablet   EPINEPHRINE  0.3 mg/0.3 mL IJ SOAJ injection   Other Relevant Orders   Lipid panel   GERD (gastroesophageal reflux disease)    Cholesterol levels monitored. On atorvastatin  until October. Fasting not required for testing. - Order cholesterol panel. - Continue atorvastatin .      Fear of flying   Rare use of prn xanax .      Essential hypertension    Blood pressure well-controlled at home and during insurance checkup. Slightly elevated today, likely due to white coat syndrome. - Continue current blood pressure management. - Re-evaluate blood pressure in 6 months.       Relevant Medications   amLODipine  (NORVASC ) 5 MG tablet   EPINEPHRINE  0.3 mg/0.3 mL IJ SOAJ injection   Other Relevant Orders    Comp Met (CMET)   Coagulation disorder (HCC)   Maintained on chronic coumadin  per coumadin  clinic.      Other Visit Diagnoses       Hyperglycemia       Relevant Orders   HgB A1c     Allergy to shellfish       Relevant Medications   EPINEPHRINE  0.3 mg/0.3 mL IJ SOAJ injection       I have discontinued Davy M. Augustus's cholecalciferol, benzonatate , and amoxicillin -clavulanate. I have also changed her levothyroxine . Additionally, I am having her maintain her ALPRAZolam , aspirin EC, Cyanocobalamin (VITAMIN B-12 PO), triamcinolone  cream, SUMAtriptan , atorvastatin , erythromycin , fluticasone , warfarin, omeprazole , amLODipine , and EPINEPHrine .  Meds ordered this encounter  Medications   levothyroxine  (SYNTHROID ) 100 MCG tablet    Sig: Take 1 tablet (100 mcg total) by mouth daily before breakfast.    Dispense:  90 tablet    Refill:  1    Supervising Provider:   Randie Bustle A [[id number]]   amLODipine  (NORVASC ) 5 MG tablet    Sig: Take 1 tablet (5 mg total) by mouth daily.    Dispense:  90 tablet    Refill:  1    Supervising Provider:   Randie Bustle A [[id number]]   EPINEPHRINE  0.3 mg/0.3 mL IJ SOAJ injection    Sig: Inject 0.3 mg into the muscle as needed  for anaphylaxis. Use as needed for severe allergic reaction    Dispense:  2 each    Refill:  2    Supervising Provider:   Randie Bustle A [[id number]]

## 2023-07-14 NOTE — Assessment & Plan Note (Signed)
 Maintained on chronic coumadin  per coumadin  clinic.

## 2023-07-15 ENCOUNTER — Ambulatory Visit: Payer: Self-pay | Admitting: Family

## 2023-07-15 LAB — COMPREHENSIVE METABOLIC PANEL WITH GFR
AG Ratio: 1.6 (calc) (ref 1.0–2.5)
ALT: 37 U/L — ABNORMAL HIGH (ref 6–29)
AST: 31 U/L (ref 10–35)
Albumin: 4.6 g/dL (ref 3.6–5.1)
Alkaline phosphatase (APISO): 72 U/L (ref 37–153)
BUN: 12 mg/dL (ref 7–25)
CO2: 28 mmol/L (ref 20–32)
Calcium: 9.9 mg/dL (ref 8.6–10.4)
Chloride: 104 mmol/L (ref 98–110)
Creat: 0.7 mg/dL (ref 0.50–1.05)
Globulin: 2.8 g/dL (ref 1.9–3.7)
Glucose, Bld: 92 mg/dL (ref 65–99)
Potassium: 3.9 mmol/L (ref 3.5–5.3)
Sodium: 140 mmol/L (ref 135–146)
Total Bilirubin: 0.4 mg/dL (ref 0.2–1.2)
Total Protein: 7.4 g/dL (ref 6.1–8.1)
eGFR: 98 mL/min/{1.73_m2} (ref >=60)

## 2023-07-15 LAB — HEMOGLOBIN A1C
Hgb A1c MFr Bld: 5.7 % — ABNORMAL HIGH (ref <5.7)
Mean Plasma Glucose: 117 mg/dL
eAG (mmol/L): 6.5 mmol/L

## 2023-07-15 LAB — LIPID PANEL
Cholesterol: 150 mg/dL (ref <200)
HDL: 75 mg/dL (ref >=50)
LDL Cholesterol (Calc): 59 mg/dL
Non-HDL Cholesterol (Calc): 75 mg/dL (ref <130)
Total CHOL/HDL Ratio: 2 (calc) (ref <5.0)
Triglycerides: 80 mg/dL (ref <150)

## 2023-07-15 LAB — VITAMIN D 25 HYDROXY (VIT D DEFICIENCY, FRACTURES): Vit D, 25-Hydroxy: 48 ng/mL (ref 30–100)

## 2023-07-15 LAB — TSH: TSH: 0.44 m[IU]/L (ref 0.40–4.50)

## 2023-07-22 ENCOUNTER — Ambulatory Visit

## 2023-07-27 ENCOUNTER — Ambulatory Visit

## 2023-07-28 ENCOUNTER — Emergency Department (HOSPITAL_BASED_OUTPATIENT_CLINIC_OR_DEPARTMENT_OTHER)
Admission: EM | Admit: 2023-07-28 | Discharge: 2023-07-28 | Disposition: A | Discharge status: Home / Self Care | Attending: Emergency Medicine | Admitting: Emergency Medicine

## 2023-07-28 ENCOUNTER — Other Ambulatory Visit: Payer: Self-pay

## 2023-07-28 ENCOUNTER — Encounter (HOSPITAL_BASED_OUTPATIENT_CLINIC_OR_DEPARTMENT_OTHER): Payer: Self-pay

## 2023-07-28 ENCOUNTER — Emergency Department (HOSPITAL_BASED_OUTPATIENT_CLINIC_OR_DEPARTMENT_OTHER)

## 2023-07-28 ENCOUNTER — Ambulatory Visit: Payer: Self-pay

## 2023-07-28 DIAGNOSIS — Z7901 Long term (current) use of anticoagulants: Secondary | ICD-10-CM | POA: Insufficient documentation

## 2023-07-28 DIAGNOSIS — I2510 Atherosclerotic heart disease of native coronary artery without angina pectoris: Secondary | ICD-10-CM | POA: Insufficient documentation

## 2023-07-28 DIAGNOSIS — Z79899 Other long term (current) drug therapy: Secondary | ICD-10-CM | POA: Insufficient documentation

## 2023-07-28 DIAGNOSIS — I10 Essential (primary) hypertension: Secondary | ICD-10-CM | POA: Insufficient documentation

## 2023-07-28 DIAGNOSIS — R072 Precordial pain: Secondary | ICD-10-CM | POA: Insufficient documentation

## 2023-07-28 DIAGNOSIS — Z7982 Long term (current) use of aspirin: Secondary | ICD-10-CM | POA: Diagnosis not present

## 2023-07-28 DIAGNOSIS — R079 Chest pain, unspecified: Secondary | ICD-10-CM

## 2023-07-28 LAB — BASIC METABOLIC PANEL WITH GFR
Anion gap: 12 (ref 5–15)
BUN: 10 mg/dL (ref 8–23)
CO2: 26 mmol/L (ref 22–32)
Calcium: 10.1 mg/dL (ref 8.9–10.3)
Chloride: 103 mmol/L (ref 98–111)
Creatinine, Ser: 0.71 mg/dL (ref 0.44–1.00)
GFR, Estimated: >60 mL/min (ref >60)
Glucose, Bld: 73 mg/dL (ref 70–99)
Potassium: 4 mmol/L (ref 3.5–5.1)
Sodium: 141 mmol/L (ref 135–145)

## 2023-07-28 LAB — TROPONIN T, HIGH SENSITIVITY
Troponin T High Sensitivity: 17 ng/L (ref <19)
Troponin T High Sensitivity: <15 ng/L (ref <19)

## 2023-07-28 LAB — CBC WITH DIFFERENTIAL/PLATELET
Abs Immature Granulocytes: 0.01 10*3/uL (ref 0.00–0.07)
Basophils Absolute: 0 10*3/uL (ref 0.0–0.1)
Basophils Relative: 1 %
Eosinophils Absolute: 0.1 10*3/uL (ref 0.0–0.5)
Eosinophils Relative: 2 %
HCT: 37.4 % (ref 36.0–46.0)
Hemoglobin: 13.3 g/dL (ref 12.0–15.0)
Immature Granulocytes: 0 %
Lymphocytes Relative: 33 %
Lymphs Abs: 1.7 10*3/uL (ref 0.7–4.0)
MCH: 32.7 pg (ref 26.0–34.0)
MCHC: 35.6 g/dL (ref 30.0–36.0)
MCV: 91.9 fL (ref 80.0–100.0)
Monocytes Absolute: 0.5 10*3/uL (ref 0.1–1.0)
Monocytes Relative: 10 %
Neutro Abs: 2.7 10*3/uL (ref 1.7–7.7)
Neutrophils Relative %: 54 %
Platelets: 246 10*3/uL (ref 150–400)
RBC: 4.07 MIL/uL (ref 3.87–5.11)
RDW: 13 % (ref 11.5–15.5)
WBC: 5.1 10*3/uL (ref 4.0–10.5)
nRBC: 0 % (ref 0.0–0.2)

## 2023-07-28 LAB — PROTIME-INR
INR: 2.4 — ABNORMAL HIGH (ref 0.8–1.2)
Prothrombin Time: 27.5 s — ABNORMAL HIGH (ref 11.4–15.2)

## 2023-07-28 NOTE — ED Triage Notes (Addendum)
 Patient arrives POV with complaints of left side chest discomfort radiating to the center of her chest x4 days. Patient is also on coumadin  for coronary issues. Rates discomfort a 3/10.

## 2023-07-28 NOTE — Discharge Instructions (Signed)
 You were seen in the emergency department for some chest discomfort.  You had blood work EKG chest x-ray that did not show any evidence of heart attack.  This is most likely muscular.  You can use heat or ice to the area and Tylenol  for pain.  Follow-up with your primary care doctor.  Return to the emergency department if any worsening or concerning symptoms

## 2023-07-28 NOTE — Telephone Encounter (Signed)
FYI. Pt going to ED.  

## 2023-07-28 NOTE — ED Notes (Signed)
 Sat. Moving items and now having pain in the L chest and L arm and L sternal area.  Pt. Reports costa chondritis years ago.  Pt. Reports history of CAD and surgical intervention.  Poss. Blood clots with PFO  and blood thinners 20 years now and asp. Therapy.

## 2023-07-28 NOTE — Telephone Encounter (Signed)
 FYI Only or Action Required?: FYI only for provider.  Patient was last seen in primary care on 07/14/2023 by Daryl Setter, NP. Called Nurse Triage reporting Chest Pain. Symptoms began several days ago. Interventions attempted: OTC medications: Tums, tylenol , ibuprofen and Prescription medications: Prilosec. Symptoms are: mild central chest pressure/dull ache radiating to left shoulder x few hours and worse if taking a deep breath, fatigue, nausea (she states this is her baseline and not worsened), clammy episode this weekend gradually worsening (patient states the pain resolved on Monday and came back yesterday).  Triage Disposition: Go to ED Now (overriding Call EMS 911 Now)  Patient/caregiver understands and will follow disposition?: Yes                   Copied from CRM [id number]. Topic: Clinical - Red Word Triage >> Jul 28, 2023  9:09 AM Franky GRADE wrote: Red Word that prompted transfer to Nurse Triage: Patient has been experiencing chest pain since Saturday on and off. She states the pain started on the left side of the chest but has moved towards the center. No other symptoms. Reason for Disposition  [1] Chest pain lasts > 5 minutes AND [2] age > 51  Answer Assessment - Initial Assessment Questions 1. LOCATION: Where does it hurt?       Started on the left side and has moved more towards the center.  2. RADIATION: Does the pain go anywhere else? (e.g., into neck, jaw, arms, back)     Left upper shoulder.  3. ONSET: When did the chest pain begin? (Minutes, hours or days)      Saturday.  4. PATTERN: Does the pain come and go, or has it been constant since it started?  Does it get worse with exertion?      Comes and goes.  5. DURATION: How long does it last (e.g., seconds, minutes, hours)     Feeling it currently and states she has been feeling it since she woke up this morning. X few hours.  6. SEVERITY: How bad is the pain?  (e.g., Scale 1-10; mild,  moderate, or severe)    - MILD (1-3): doesn't interfere with normal activities     - MODERATE (4-7): interferes with normal activities or awakens from sleep    - SEVERE (8-10): excruciating pain, unable to do any normal activities       Pressure, nagging it's not really a pain, dull ache. Mild.  7. CARDIAC RISK FACTORS: Do you have any history of heart problems or risk factors for heart disease? (e.g., angina, prior heart attack; diabetes, high blood pressure, high cholesterol, smoker, or strong family history of heart disease)     Carotid artery stenosis, hyperlipidemia.  8. PULMONARY RISK FACTORS: Do you have any history of lung disease?  (e.g., blood clots in lung, asthma, emphysema, birth control pills)     No.  9. CAUSE: What do you think is causing the chest pain?     Patient states she was moving some luggage around on Friday. She states when she woke up Saturday the pain felt muscular under her breast. She states all day Saturday the pain was present and by Sunday it moved to the center of her chest and felt like costochondritis. Monday the pain had resolved. She states it feels like her reflux as well.  10. OTHER SYMPTOMS: Do you have any other symptoms? (e.g., dizziness, nausea, vomiting, sweating, fever, difficulty breathing, cough)       Patient denies  SOB/difficulty breathing, dizziness, vomiting, sweating. Patient states she always has nausea and she states her chest hurts worse if she takes a deep breath, she states her husband said this weekend she seemed clammy, fatigue.  11. PREGNANCY: Is there any chance you are pregnant? When was your last menstrual period?       N/A.  Protocols used: Chest Pain-A-AH

## 2023-07-28 NOTE — ED Provider Notes (Signed)
 [facility] Provider Note   CSN: [id number] Arrival date & time: 07/28/23  1050     Patient presents with: Chest Pain   Melinda Lopez is a 63 y.o. female.  She has a history of carotid artery disease and coronary disease and is on warfarin.  She said 5 days ago she started with some left-sided chest pain, mostly was under her breast but then began radiating across into the center of her chest.  Pressure and soreness.  She had done some lifting cleaning house and so she attributed it to that.  Symptoms were a little bit better yesterday but recurred again today.  Called her primary care doctor who recommended she come here for further evaluation.  Has been taking her daily aspirin and her warfarin along with her regular medications.  No history of heart attack.  No diaphoresis nausea vomiting shortness of breath.  Pain did radiate somewhat to the left arm.   The history is provided by the patient.  Chest Pain Pain location:  Substernal area and L chest Pain quality: aching and pressure   Pain radiates to:  L arm Pain severity:  Moderate Onset quality:  Gradual Duration:  5 days Timing:  Intermittent Progression:  Unchanged Chronicity:  New Relieved by:  None tried Worsened by:  Nothing Ineffective treatments:  None tried Associated symptoms: no abdominal pain, no cough, no diaphoresis, no dizziness, no fever, no nausea, no shortness of breath, no syncope and no vomiting   Risk factors: coronary artery disease, high cholesterol and hypertension   Risk factors: no prior DVT/PE and no smoking        Prior to Admission medications   Medication Sig Start Date End Date Taking? Authorizing Provider  ALPRAZolam  (XANAX ) 0.5 MG tablet Take 1/2-1 tablet by mouth prior to flying. 07/26/20   O'Sullivan, Melissa, NP  amLODipine  (NORVASC ) 5 MG tablet Take 1 tablet (5 mg total) by mouth daily. 07/14/23   O'Sullivan, Melissa, NP  aspirin EC 81 MG  tablet Take 81 mg by mouth daily. Swallow whole.    [provider]  atorvastatin  (LIPITOR ) 20 MG tablet TAKE 1 TABLET BY MOUTH EVERY DAY 06/02/23   O'Sullivan, Melissa, NP  Cyanocobalamin (VITAMIN B-12 PO) Take 1 tablet by mouth daily.    [provider]  EPINEPHRINE  0.3 mg/0.3 mL IJ SOAJ injection Inject 0.3 mg into the muscle as needed for anaphylaxis. Use as needed for severe allergic reaction 07/14/23   Daryl Setter, NP  erythromycin  ophthalmic ointment PLACE 1 APPLICATION INTO BOTH EYES 3 (THREE) TIMES DAILY. 06/02/23   O'Sullivan, Melissa, NP  fluticasone  (FLONASE ) 50 MCG/ACT nasal spray PLACE 1 SPRAY INTO BOTH NOSTRILS 2 (TWO) TIMES DAILY 06/02/23   Daryl Setter, NP  levothyroxine  (SYNTHROID ) 100 MCG tablet Take 1 tablet (100 mcg total) by mouth daily before breakfast. 07/14/23   Daryl Setter, NP  omeprazole  (PRILOSEC) 40 MG capsule TAKE 1 CAPSULE (40 MG TOTAL) BY MOUTH DAILY. 06/28/23   O'Sullivan, Melissa, NP  SUMAtriptan  (IMITREX ) 50 MG tablet MAY REPEAT IN 2 HOURS IF HEADACHE PERSISTS OR RECURS. 09/01/22   O'Sullivan, Melissa, NP  triamcinolone  cream (KENALOG ) 0.1 % APPLY 1 APPLICATION TOPICALLY TWICE A DAY AS NEEDED 07/29/22   O'Sullivan, Melissa, NP  warfarin (COUMADIN ) 10 MG tablet TAKE 1/2 TABLET TO 1 TABLET BY MOUTH DAILY AS DIRECTED BY COUMADIN  CLINIC 06/10/23   Verlin Lonni BIRCH, MD    Allergies: Shellfish allergy, Codeine, Morphine, Morphine and codeine,  Phenergan [promethazine], and Sulfa antibiotics    Review of Systems  Constitutional:  Negative for diaphoresis and fever.  Respiratory:  Negative for cough and shortness of breath.   Cardiovascular:  Positive for chest pain. Negative for syncope.  Gastrointestinal:  Negative for abdominal pain, nausea and vomiting.  Neurological:  Negative for dizziness.    Updated Vital Signs BP (!) 167/81   Pulse (!) 56   Temp 98 F (36.7 C)   Resp 20   Ht 5' 7 (1.702 m)   Wt 86.6 kg   SpO2  100%   BMI 29.90 kg/m   Physical Exam Vitals and nursing note reviewed.  Constitutional:      General: She is not in acute distress.    Appearance: Normal appearance. She is well-developed.  HENT:     Head: Normocephalic and atraumatic.   Eyes:     Conjunctiva/sclera: Conjunctivae normal.    Cardiovascular:     Rate and Rhythm: Normal rate and regular rhythm.     Heart sounds: No murmur heard. Pulmonary:     Effort: Pulmonary effort is normal. No respiratory distress.     Breath sounds: Normal breath sounds. No stridor. No wheezing.  Chest:     Chest wall: Tenderness (reproducable sternal tenderness) present.  Abdominal:     Palpations: Abdomen is soft.     Tenderness: There is no abdominal tenderness. There is no guarding or rebound.   Musculoskeletal:        General: No tenderness or deformity. Normal range of motion.     Cervical back: Neck supple.     Right lower leg: No tenderness. No edema.     Left lower leg: No tenderness. No edema.   Skin:    General: Skin is warm and dry.   Neurological:     General: No focal deficit present.     Mental Status: She is alert.     GCS: GCS eye subscore is 4. GCS verbal subscore is 5. GCS motor subscore is 6.     (all labs ordered are listed, but only abnormal results are displayed) Labs Reviewed  PROTIME-INR - Abnormal; Notable for the following components:      Result Value   Prothrombin Time 27.5 (*)    INR 2.4 (*)    All other components within normal limits  BASIC METABOLIC PANEL WITH GFR  CBC WITH DIFFERENTIAL/PLATELET  TROPONIN T, HIGH SENSITIVITY  TROPONIN T, HIGH SENSITIVITY    EKG: EKG Interpretation Date/Time:  Wednesday July 28 2023 11:03:04 EDT Ventricular Rate:  55 PR Interval:  160 QRS Duration:  99 QT Interval:  443 QTC Calculation: 424 R Axis:   -3  Text Interpretation: Sinus rhythm Borderline T abnormalities, anterior leads COPY Confirmed by Towana Sharper [phone]) on 07/28/2023 11:11:00  AM  Radiology: [facility] Chest Port 1 View Result Date: 07/28/2023 CLINICAL DATA:  Chest pain. EXAM: PORTABLE CHEST 1 VIEW COMPARISON:  01/28/2022. FINDINGS: Low lung volumes. The heart size and mediastinal contours are within normal limits for technique. No focal consolidation, pleural effusion, or pneumothorax. No acute osseous abnormality. IMPRESSION: Low lung volumes.  No acute cardiopulmonary findings. Electronically Signed   By: Harrietta Sherry M.D.   On: 07/28/2023 12:25     Procedures   Medications Ordered in the ED - No data to display  Clinical Course as of 07/28/23 1720  Wed Jul 28, 2023  1203 Chest x-ray interpreted by me as no acute infiltrate or pneumothorax.  Awaiting radiology reading. [MB]  1439 Reviewed results with patient.  She is comfortable plan for discharge and will follow-up outpatient with her PCP and cardiology team. [MB]    Clinical Course User Index [MB] Towana Ozell BROCKS, MD                                 Medical Decision Making Amount and/or Complexity of Data Reviewed Labs: ordered. Radiology: ordered.   This patient complains of left-sided chest pain; this involves an extensive number of treatment Options and is a complaint that carries with it a high risk of complications and morbidity. The differential includes musculoskeletal pain, ACS, pneumonia, pneumothorax, PE  I ordered, reviewed and interpreted labs, which included CBC normal chemistries normal troponins flat INR therapeutic I ordered imaging studies which included chest x-ray and I independently    visualized and interpreted imaging which showed no acute findings Previous records obtained and reviewed in epic including prior PCP and cardiology notes Cardiac monitoring reviewed, normal sinus rhythm Social determinants considered, no significant barriers Critical Interventions: None  After the interventions stated above, I reevaluated the patient and found patient to be pain-free at this  time Admission and further testing considered, no indications for admission or further workup.  Recommended close follow-up with her PCP and her cardiologist.  Return instructions discussed.      Final diagnoses:  Nonspecific chest pain    ED Discharge Orders     None          Towana Ozell BROCKS, MD 07/28/23 1722

## 2023-08-12 ENCOUNTER — Ambulatory Visit
Admission: RE | Admit: 2023-08-12 | Discharge: 2023-08-12 | Disposition: A | Discharge status: Home / Self Care | Source: Ambulatory Visit | Attending: Family | Admitting: Family

## 2023-08-12 DIAGNOSIS — Z1231 Encounter for screening mammogram for malignant neoplasm of breast: Secondary | ICD-10-CM

## 2023-08-13 ENCOUNTER — Ambulatory Visit (INDEPENDENT_AMBULATORY_CARE_PROVIDER_SITE_OTHER)

## 2023-08-13 DIAGNOSIS — Z5181 Encounter for therapeutic drug level monitoring: Secondary | ICD-10-CM

## 2023-08-13 DIAGNOSIS — Z8679 Personal history of other diseases of the circulatory system: Secondary | ICD-10-CM | POA: Diagnosis not present

## 2023-08-13 LAB — PROTIME-INR
INR: 2.3 — ABNORMAL HIGH (ref 0.9–1.2)
Prothrombin Time: 23.8 s — ABNORMAL HIGH (ref 9.1–12.0)

## 2023-08-13 NOTE — Patient Instructions (Signed)
 Description   RELEASE THE STANDING ORDER TO HAVE NEXT INR DRAWN. Spoke with pt and advised to continue taking 10mg  daily EXCEPT 5mg  on Monday, Wednesday, and Friday.  Stay consistent with greens each week (4-5 servings per week). Recheck INR in 6 weeks at [facility].  [facility] [phone]

## 2023-08-13 NOTE — Progress Notes (Signed)
Please see anticoagulation encounter.

## 2023-08-19 ENCOUNTER — Ambulatory Visit: Payer: Self-pay | Admitting: Family

## 2023-09-07 ENCOUNTER — Telehealth: Payer: Self-pay

## 2023-09-07 NOTE — Telephone Encounter (Signed)
Left message to follow up on referral

## 2023-09-12 ENCOUNTER — Other Ambulatory Visit: Payer: Self-pay | Admitting: Family

## 2023-09-12 DIAGNOSIS — G43909 Migraine, unspecified, not intractable, without status migrainosus: Secondary | ICD-10-CM

## 2023-09-22 ENCOUNTER — Telehealth: Payer: Self-pay | Admitting: *Deleted

## 2023-09-22 ENCOUNTER — Other Ambulatory Visit: Payer: Self-pay | Admitting: *Deleted

## 2023-09-22 DIAGNOSIS — Z8679 Personal history of other diseases of the circulatory system: Secondary | ICD-10-CM

## 2023-09-22 DIAGNOSIS — Z5181 Encounter for therapeutic drug level monitoring: Secondary | ICD-10-CM

## 2023-09-22 NOTE — Telephone Encounter (Signed)
 Called patient to remind her to go to the lab to get INR done. She states she will go sometime today and was thankful for the call.

## 2023-09-23 ENCOUNTER — Other Ambulatory Visit: Payer: Self-pay

## 2023-09-23 ENCOUNTER — Ambulatory Visit (INDEPENDENT_AMBULATORY_CARE_PROVIDER_SITE_OTHER)

## 2023-09-23 DIAGNOSIS — D6859 Other primary thrombophilia: Secondary | ICD-10-CM | POA: Diagnosis not present

## 2023-09-23 DIAGNOSIS — Z5181 Encounter for therapeutic drug level monitoring: Secondary | ICD-10-CM

## 2023-09-23 DIAGNOSIS — Z7901 Long term (current) use of anticoagulants: Secondary | ICD-10-CM

## 2023-09-23 DIAGNOSIS — Z8679 Personal history of other diseases of the circulatory system: Secondary | ICD-10-CM

## 2023-09-23 DIAGNOSIS — G459 Transient cerebral ischemic attack, unspecified: Secondary | ICD-10-CM | POA: Diagnosis not present

## 2023-09-23 LAB — PROTIME-INR
INR: 2.5 — ABNORMAL HIGH (ref 0.9–1.2)
Prothrombin Time: 25.8 s — ABNORMAL HIGH (ref 9.1–12.0)

## 2023-09-23 NOTE — Patient Instructions (Signed)
 Description   RELEASE THE STANDING ORDER TO HAVE NEXT INR DRAWN. INR-2.5; Spoke with pt and advised to continue taking 10mg  daily EXCEPT 5mg  on Monday, Wednesday, and Friday.  Stay consistent with greens each week (4-5 servings per week). Recheck INR in 6 weeks at [facility].  Coumadin  Clinic [phone]

## 2023-09-23 NOTE — Progress Notes (Signed)
 Description   RELEASE THE STANDING ORDER TO HAVE NEXT INR DRAWN. Spoke with pt and advised to continue taking 10mg  daily EXCEPT 5mg  on Monday, Wednesday, and Friday.  Stay consistent with greens each week (4-5 servings per week). Recheck INR in 6 weeks at [facility].  [facility] Clinic [phone]

## 2023-10-21 ENCOUNTER — Other Ambulatory Visit: Payer: Self-pay | Admitting: Family

## 2023-10-21 DIAGNOSIS — E785 Hyperlipidemia, unspecified: Secondary | ICD-10-CM

## 2023-11-03 ENCOUNTER — Telehealth: Payer: Self-pay | Admitting: *Deleted

## 2023-11-03 ENCOUNTER — Other Ambulatory Visit: Payer: Self-pay | Admitting: *Deleted

## 2023-11-03 DIAGNOSIS — Z5181 Encounter for therapeutic drug level monitoring: Secondary | ICD-10-CM

## 2023-11-03 DIAGNOSIS — Z8679 Personal history of other diseases of the circulatory system: Secondary | ICD-10-CM

## 2023-11-03 NOTE — Telephone Encounter (Signed)
 Spoke with patient and she states she will not be able to have INR drawn today and will go in the morning. Will note in follow up.

## 2023-11-03 NOTE — Telephone Encounter (Signed)
 Called patient to remind her that her INR is due today, there was no answer so left her a message. Also, released lab at this time.

## 2023-11-04 ENCOUNTER — Ambulatory Visit (INDEPENDENT_AMBULATORY_CARE_PROVIDER_SITE_OTHER): Payer: Self-pay | Admitting: Internal Medicine

## 2023-11-04 ENCOUNTER — Other Ambulatory Visit: Payer: Self-pay

## 2023-11-04 DIAGNOSIS — G459 Transient cerebral ischemic attack, unspecified: Secondary | ICD-10-CM

## 2023-11-04 DIAGNOSIS — Z7901 Long term (current) use of anticoagulants: Secondary | ICD-10-CM

## 2023-11-04 DIAGNOSIS — Z8679 Personal history of other diseases of the circulatory system: Secondary | ICD-10-CM

## 2023-11-04 DIAGNOSIS — D6859 Other primary thrombophilia: Secondary | ICD-10-CM

## 2023-11-04 DIAGNOSIS — Z5181 Encounter for therapeutic drug level monitoring: Secondary | ICD-10-CM

## 2023-11-04 LAB — PROTIME-INR
INR: 2.2 — ABNORMAL HIGH (ref 0.9–1.2)
Prothrombin Time: 22.4 s — ABNORMAL HIGH (ref 9.1–12.0)

## 2023-11-04 NOTE — Progress Notes (Signed)
 INR 2.2 Please see anticoagulation encounter RELEASE THE STANDING ORDER TO HAVE NEXT INR DRAWN.  Spoke with pt and advised to continue taking 10mg  daily EXCEPT 5mg  on Monday, Wednesday, and Friday.  Stay consistent with greens each week (4-5 servings per week). Recheck INR in 6 weeks at [facility].  Coumadin  Clinic [phone]

## 2023-11-12 IMAGING — MG MM DIGITAL SCREENING BILAT W/ TOMO AND CAD
8 series · 8 of 24 positions shown · non-contrast
Comparison: Previous exam(s).

ACR Breast Density Category a: The breast tissue is almost entirely
fatty.

CLINICAL DATA: Screening.

EXAM:
DIGITAL SCREENING BILATERAL MAMMOGRAM WITH TOMOSYNTHESIS AND CAD
TECHNIQUE: Bilateral screening digital craniocaudal and mediolateral oblique
mammograms were obtained. Bilateral screening digital breast
tomosynthesis was performed. The images were evaluated with
computer-aided detection.

[L CC synth-2D]
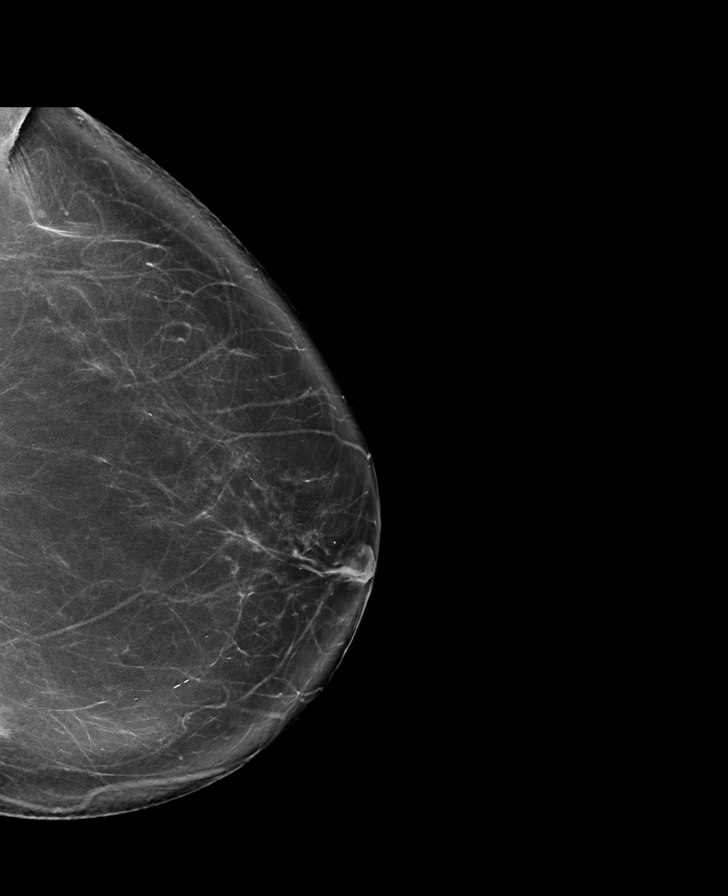

[R MLO synth-2D]
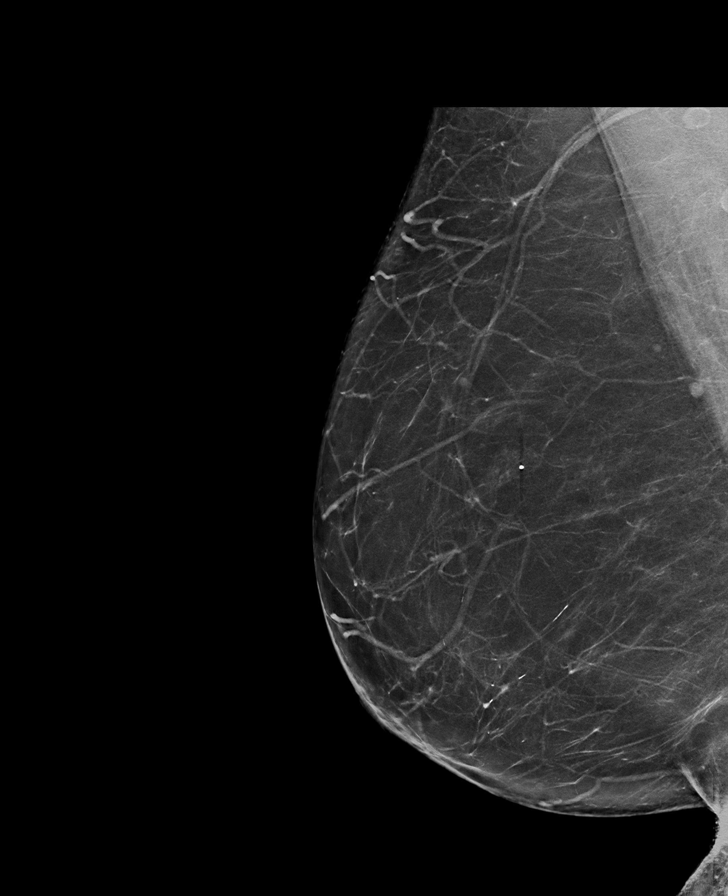

[L MLO synth-2D]
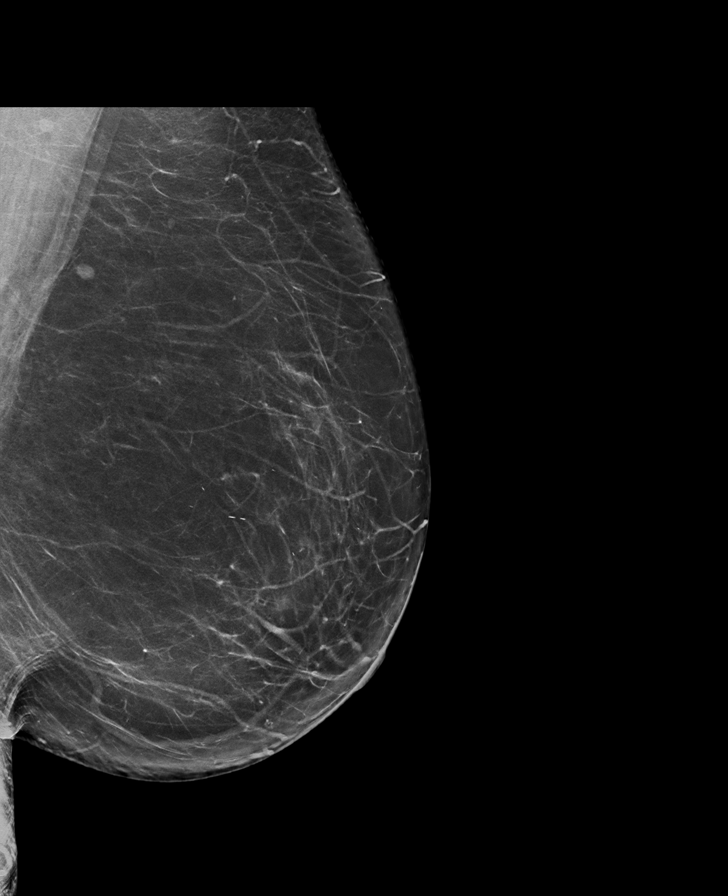

[R CC synth-2D]
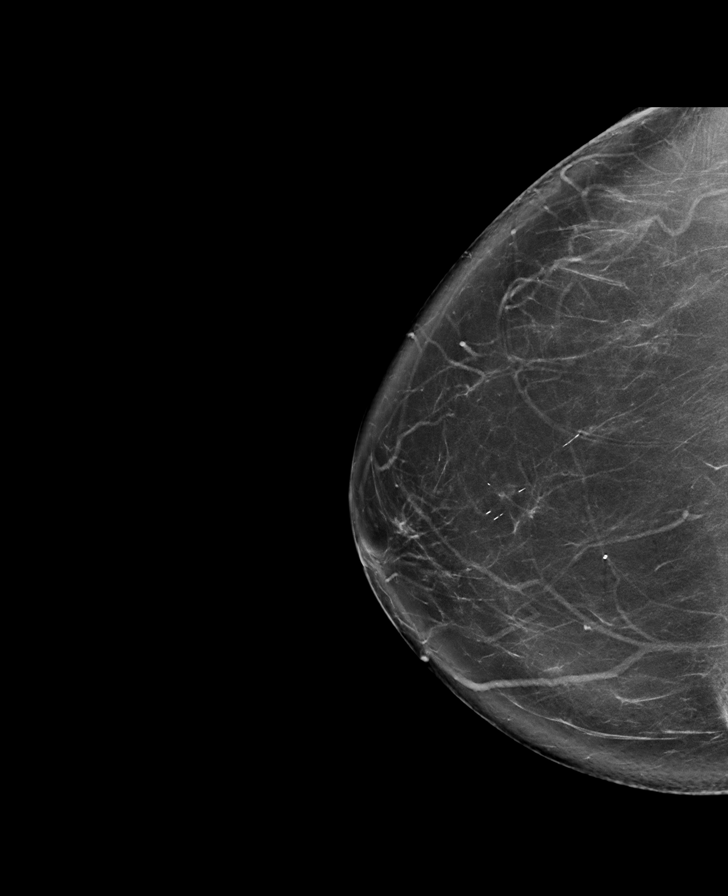

[L MLO tomo · tomo slice 44/87.0]
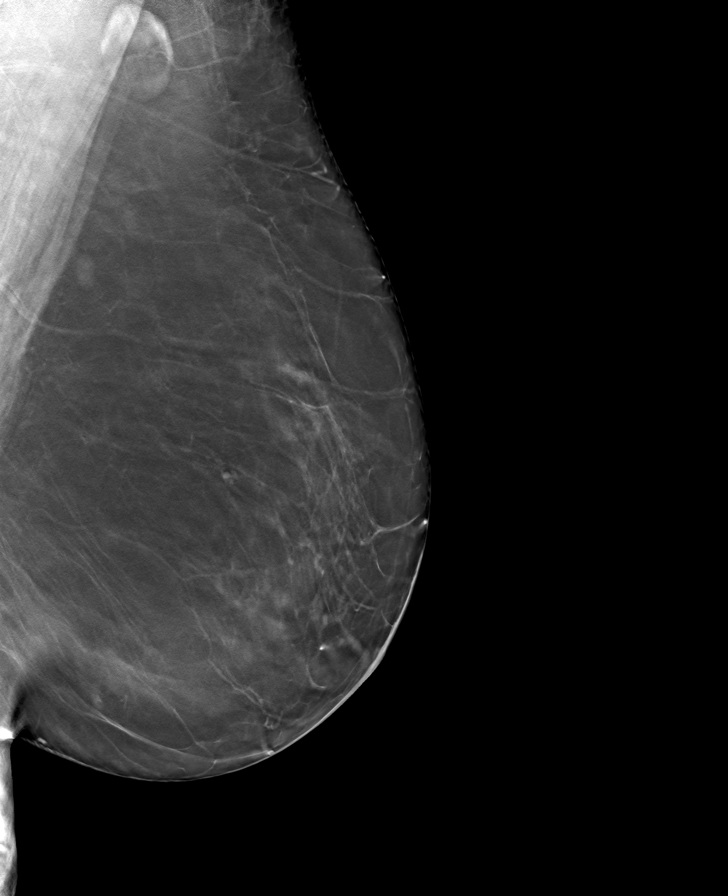

[L CC tomo · tomo slice 44/87.0]
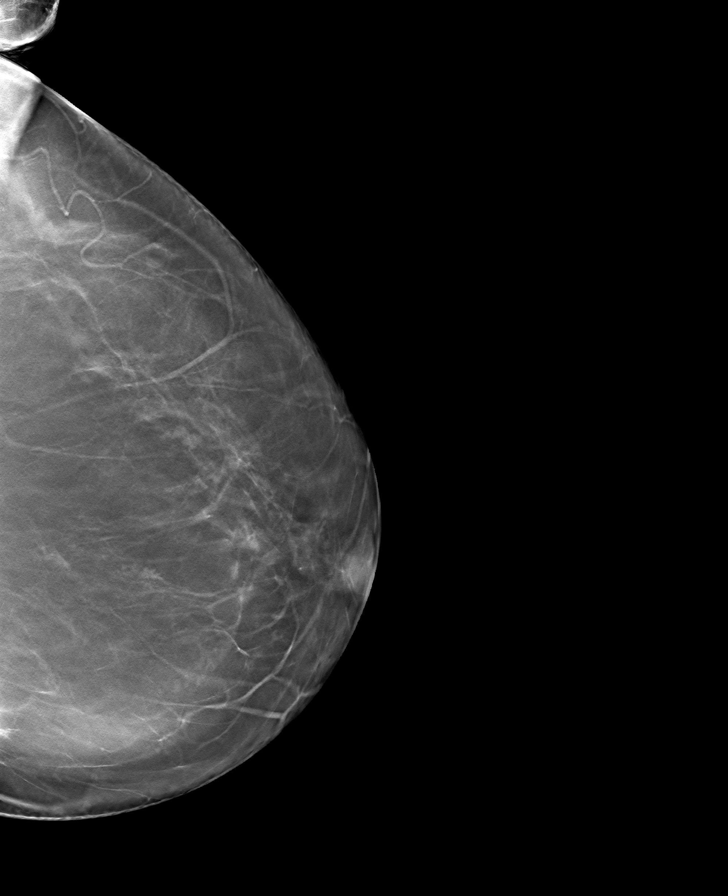

[R MLO tomo · tomo slice 43/85.0]
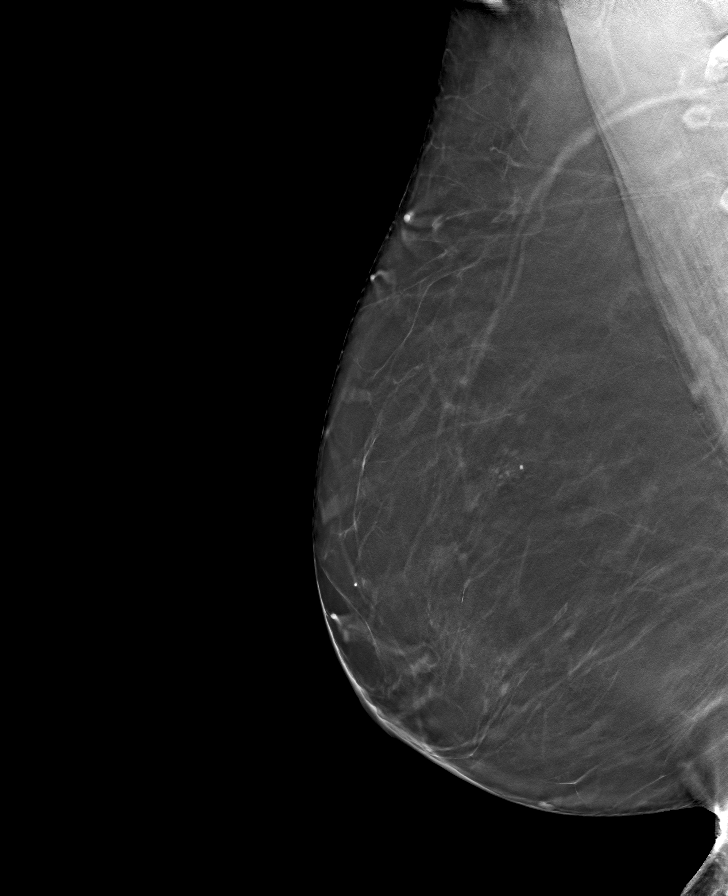

[R CC tomo · tomo slice 45/89.0]
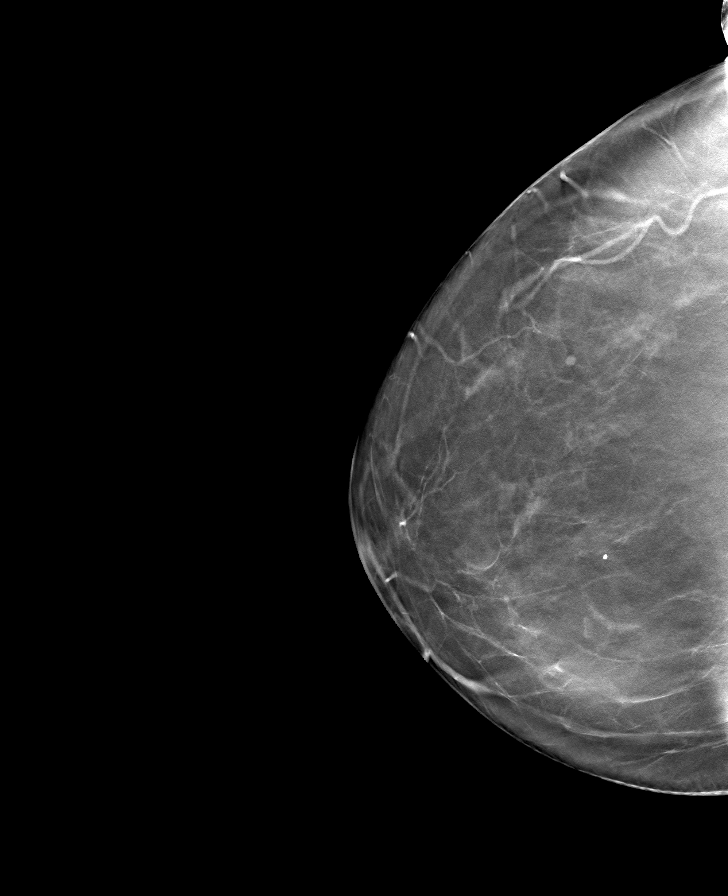

[8 of 24 positions shown; findings below may reference images not displayed]

FINDINGS: There are no findings suspicious for malignancy.
IMPRESSION: No mammographic evidence of malignancy. A result letter of this
screening mammogram will be mailed directly to the patient.

RECOMMENDATION:
Screening mammogram in one year. (Code:0E-3-N98)

BI-RADS CATEGORY  1: Negative.

## 2023-11-17 ENCOUNTER — Telehealth: Payer: Self-pay

## 2023-11-17 NOTE — Telephone Encounter (Signed)
   Pre-operative Risk Assessment    Patient Name: Melinda Lopez  DOB: [birth year] MRN: [id number]   Date of last office visit: 11/02/22  Dr. Verlin Date of next office visit: NA  Request for Surgical Clearance    Procedure:  Dental Extraction - Amount of Teeth to be Pulled:  1 tooth, crown prep, deep cleaning, gingivectomy  Date of Surgery:  Clearance TBD                               Surgeon:  Dr. Arlana Socks Group or Practice Name:  [facility]  Phone number:  [phone] Fax number:  [phone]   Type of Clearance Requested:   - Medical  - Pharmacy:  Hold Aspirin and Warfarin (Coumadin )    Type of Anesthesia:  Local Lidocaine   Additional requests/questions:    Bonney Arlyne LITTIE Kallie   11/17/2023, 4:55 PM

## 2023-11-18 NOTE — Telephone Encounter (Signed)
   Name: Melinda Lopez  DOB: April 30, 1960  MRN: [id number]  Primary Cardiologist: Lonni Cash, MD Last OV 11/02/22   Chart reviewed as part of pre-operative protocol coverage. Because of Melinda Lopez's past medical history and time since last visit, she will require a follow-up in-office visit in order to better assess preoperative cardiovascular risk. Patient due for annual visit.   Pre-op covering staff: - Please schedule appointment and call patient to inform them. If patient already had an upcoming appointment within acceptable timeframe, please add pre-op clearance to the appointment notes so provider is aware. - Please contact requesting surgeon's office via preferred method (i.e, phone, fax) to inform them of need for appointment prior to surgery.  This message will also be routed to pharmacy pool for input on holding warfarin as requested below so that this information is available to the clearing provider at time of patient's appointment.   Dailee Manalang D Aaylah Pokorny, NP  11/18/2023, 8:01 AM

## 2023-11-18 NOTE — Progress Notes (Signed)
 Cardiology Clinic Note   Patient Name: Melinda Lopez Date of Encounter: 11/19/2023  Primary Care Provider:  Daryl Setter, NP Primary Cardiologist:  Lonni Cash, MD  Patient Profile    Melinda Lopez 63 year old female presents to the clinic today for follow-up evaluation of her hypertension, hyperlipidemia, and preoperative cardiac evaluation.  Past Medical History    Past Medical History:  Diagnosis Date   Carotid artery stenosis    Chronic headaches    Fatty liver 04/07/2016   Glaucoma    Hx of adenomatous polyp of colon 07/30/2011   Hx of blood clots    fingers   Hyperlipemia    Hypertension    Hypothyroidism    Migraine 1982   pt states migraines since then.    PFO (patent foramen ovale)    TIA (transient ischemic attack)    Past Surgical History:  Procedure Laterality Date   BREAST LUMPECTOMY Right 2000   benign   CAROTID ARTERY - SUBCLAVIAN ARTERY BYPASS GRAFT Right 2006   COLONOSCOPY     UPPER GASTROINTESTINAL ENDOSCOPY      Allergies  Allergies  Allergen Reactions   Shellfish Allergy Anaphylaxis   Codeine Nausea Only   Morphine Other (See Comments)    Other reaction(s): Other (See Comments), slurring words, slurring words   Morphine And Codeine Other (See Comments)    slurring words   Phenergan [Promethazine] Other (See Comments)    Causes blood presser to bottom out   Sulfa Antibiotics Rash and Nausea And Vomiting    Nausea    History of Present Illness    Melinda Lopez has a PMH of carotid artery disease, HTN, HLD, hypothyroidism, possible PFO, prior TIA, and hypercoagulability state (antiphospholipid antibody syndrome).  She was seen to establish care 3/24.  She had undergone right carotid endarterectomy at age 10.  She had a TIA in her 44s.  She was told previously that she had PFO.  She was placed on Coumadin  for this.  She had been followed at [facility] and decided to transfer care to [facility].  She was seen  by Dr. Serene with [facility].  On 3/24 she described dyspnea and fatigue.  She had coronary CTA 4/24 which showed mild plaque in her left main, LAD, and RCA.  Her echocardiogram 5/24 showed normal LV function and no valvular disease.  Her bubble study was negative for atrial shunting.  She was seen in follow-up by Dr. Cash on 11/02/2022.  During that time she denied chest pain, dyspnea, palpitations, lower extremity swelling, orthopnea, PND, dizziness, and syncope.  She presents to the clinic today for follow-up evaluation and preoperative cardiac evaluation.  She states she transition to a new dentist.  They recommended deep cleaning, molar extraction, and reported that she had a previous abscess.  She noted that 2 years prior she had contacted her primary dentist about a area of puffiness over her right cheek.  She was prescribed antibiotics.  We reviewed her previous coronary CTA and cardiac history.  She expressed understanding.  She did note some chest discomfort today.  She reported that she had been carrying and caring for her granddaughter on Wednesday.  On exam with deep palpation pain was reproducible.  I recommended that she not discontinue Coumadin  prior to her dental procedure.  I will plan follow-up in 12 months.  Today she denies  shortness of breath, lower extremity edema, fatigue, palpitations, melena, hematuria, hemoptysis, diaphoresis, weakness,  syncope, orthopnea, and PND.  Home Medications    Prior to Admission medications   Medication Sig Start Date End Date Taking? Authorizing Provider  ALPRAZolam  (XANAX ) 0.5 MG tablet Take 1/2-1 tablet by mouth prior to flying. 07/26/20   Daryl Setter, NP  amLODipine  (NORVASC ) 5 MG tablet Take 1 tablet (5 mg total) by mouth daily. 07/14/23   O'Sullivan, Melissa, NP  aspirin EC 81 MG tablet Take 81 mg by mouth daily. Swallow whole.    [provider]  atorvastatin  (LIPITOR ) 20 MG tablet Take 1 tablet (20 mg total) by mouth  daily. 10/21/23   O'Sullivan, Melissa, NP  Cyanocobalamin (VITAMIN B-12 PO) Take 1 tablet by mouth daily.    [provider]  EPINEPHRINE  0.3 mg/0.3 mL IJ SOAJ injection Inject 0.3 mg into the muscle as needed for anaphylaxis. Use as needed for severe allergic reaction 07/14/23   Daryl Setter, NP  erythromycin  ophthalmic ointment PLACE 1 APPLICATION INTO BOTH EYES 3 (THREE) TIMES DAILY. 06/02/23   O'Sullivan, Melissa, NP  fluticasone  (FLONASE ) 50 MCG/ACT nasal spray PLACE 1 SPRAY INTO BOTH NOSTRILS 2 (TWO) TIMES DAILY 06/02/23   Daryl Setter, NP  levothyroxine  (SYNTHROID ) 100 MCG tablet Take 1 tablet (100 mcg total) by mouth daily before breakfast. 07/14/23   O'Sullivan, Melissa, NP  omeprazole  (PRILOSEC) 40 MG capsule TAKE 1 CAPSULE (40 MG TOTAL) BY MOUTH DAILY. 06/28/23   O'Sullivan, Melissa, NP  SUMAtriptan  (IMITREX ) 50 MG tablet MAY REPEAT IN 2 HOURS IF HEADACHE PERSISTS OR RECURS. 09/13/23   O'Sullivan, Melissa, NP  triamcinolone  cream (KENALOG ) 0.1 % APPLY 1 APPLICATION TOPICALLY TWICE A DAY AS NEEDED 07/29/22   O'Sullivan, Melissa, NP  warfarin (COUMADIN ) 10 MG tablet TAKE 1/2 TABLET TO 1 TABLET BY MOUTH DAILY AS DIRECTED BY COUMADIN  CLINIC 06/10/23   Verlin Lonni BIRCH, MD    Family History    Family History  Problem Relation Age of Onset   Heart disease Mother        Vascular disease early 3s   Kidney disease Mother    Heart disease Father    Diabetes Sister    Kidney disease Sister    Heart murmur Brother    Heart disease Maternal Grandmother    She indicated that her mother is deceased. She indicated that her father is deceased. She indicated that her sister is alive. She indicated that the status of her brother is unknown. She indicated that her maternal grandmother is deceased.  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  Tobacco Use   Smoking status: Former    Current packs/day: 0.00    Types: Cigarettes    Quit date: 07/29/2002    Years since quitting: 21.3   Smokeless tobacco: Never  Vaping Use   Vaping status: Never Used  Substance and Sexual Activity   Alcohol use: Yes    Comment: 4 a week   Drug use: No   Sexual activity: Yes    Partners: Male  Other Topics Concern   Not on file  Social History Narrative   Former smoker- quit in the 6's   Married    2 children: 1982- daughter Ltanya- lives in [place] Elsie- lives locally   2 grandchildren  in [place]   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   Social Drivers of Health   Financial Resource Strain: Patient Declined (03/10/2023)   Overall Financial Resource Strain (CARDIA)    Difficulty of Paying Living Expenses: Patient declined  Food Insecurity: Patient Declined (03/10/2023)   Hunger Vital Sign    Worried About Running Out of Food in the Last Year: Patient declined    Ran Out of Food in the Last Year: Patient declined  Transportation Needs: Patient Declined (03/10/2023)   PRAPARE - Administrator, Civil Service (Medical): Patient declined    Lack of Transportation (Non-Medical): Patient declined  Physical Activity: Insufficiently Active (03/10/2023)   Exercise Vital Sign    Days of Exercise per Week: 3 days    Minutes of Exercise per Session: 40 min  Stress: No Stress Concern Present (03/10/2023)   [employer] of Occupational Health - Occupational Stress Questionnaire    Feeling of Stress : Only a little  Social Connections: Unknown (03/10/2023)   Social Connection and Isolation Panel    Frequency of Communication with Friends and Family: Patient declined    Frequency of Social Gatherings with Friends and Family: Patient declined    Attends Religious Services: Patient  declined    Database administrator or Organizations: Patient declined    Attends Engineer, structural: Not on file    Marital Status: Patient declined  Intimate Partner Violence: Not on file     Review of Systems    General:  No chills, fever, night sweats or weight changes.  Cardiovascular:  No chest pain, dyspnea on exertion, edema, orthopnea, palpitations, paroxysmal nocturnal dyspnea. Dermatological: No rash, lesions/masses Respiratory: No cough, dyspnea Urologic: No hematuria, dysuria Abdominal:   No nausea, vomiting, diarrhea, bright red blood per rectum, melena, or hematemesis Neurologic:  No visual changes, wkns, changes in mental status. All other systems reviewed and are otherwise negative except as noted above.  Physical Exam    VS:  BP 128/76   Pulse (!) 56   Ht 5' 7 (1.702 m)   Wt 191 lb (86.6 kg)   SpO2 94%   BMI 29.91 kg/m  , BMI Body mass index is 29.91 kg/m. GEN: Well nourished, well developed, in no acute distress. HEENT: normal. Neck: Supple, no JVD, carotid bruits, or masses. Cardiac: RRR, no murmurs, rubs, or gallops. No clubbing, cyanosis, edema.  Radials/DP/PT 2+ and equal bilaterally.  Respiratory:  Respirations regular and unlabored, clear to auscultation bilaterally. GI: Soft, nontender, nondistended, BS + x 4. MS: no deformity or atrophy. Skin: warm and dry, no rash. Neuro:  Strength and sensation are intact. Psych: Normal affect.  Accessory Clinical Findings    Recent Labs: 07/14/2023: ALT 37; TSH 0.44 07/28/2023: BUN 10; Creatinine, Ser 0.71; Hemoglobin 13.3; Platelets 246; Potassium 4.0; Sodium 141   Recent Lipid Panel    Component Value Date/Time   CHOL 150 07/14/2023 1415   TRIG 80 07/14/2023 1415   HDL 75 07/14/2023 1415   CHOLHDL 2.0 07/14/2023 1415   VLDL 10.8 06/12/2022 0748   LDLCALC 59 07/14/2023 1415         ECG personally reviewed by me today- EKG Interpretation Date/Time:  Friday November 19 2023 08:50:57  EDT Ventricular Rate:  56 PR Interval:  160 QRS Duration:  88 QT Interval:  442 QTC Calculation: 426 R Axis:   -2  Text Interpretation: Sinus bradycardia When compared with ECG of 28-Jul-2023 11:03, PREVIOUS ECG IS PRESENT Confirmed by Emelia,  Josefa [phone]) on 11/19/2023 8:53:02 AM         Assessment & Plan   1.  Coronary artery disease-reproducible chest discomfort today.  Appears to be musculoskeletal in nature..  Denies exertional chest discomfort.  Had coronary CTA 4/24 which showed mild nonobstructive plaque. Heart healthy low-sodium diet Continue amlodipine , atorvastatin  Increase physical activity as tolerated  Hyperlipidemia-LDL 59 on 6/25. High-fiber diet Continue atorvastatin  Follows with PCP  Essential hypertension-BP today 128/76. Maintain blood pressure log Continue amlodipine  Increase p.o. hydration  Hypercoagulability state, antiphospholipid antibody syndrome, prior TIA-reports compliance with warfarin.  Denies bleeding issues. Continue warfarin Precautions reviewed  Preoperative cardiac evaluation-Dental Extraction - Amount of Teeth to be Pulled:  1 tooth, crown prep, deep cleaning, gingivectomy   Date of Surgery:  Clearance TBD                                Surgeon:  Dr. Arlana Socks Group or Practice Name:  [facility]      Phone number:  [phone] Fax number:  [phone]    Primary Cardiologist: Lonni Cash, MD  Chart reviewed as part of pre-operative protocol coverage. Simple dental extractions are considered low risk procedures per guidelines and generally do not require any specific cardiac clearance. It is also generally accepted that for simple extractions and dental cleanings, there is no need to interrupt blood thinner therapy.   SBE prophylaxis is not required for the patient.  Patient's warfarin prescribed for history of TIA, antiphospholipid syndrome.  Would recommend warfarin not be held prior to upcoming  procedure.  Disposition: Follow-up with Dr. Cash or me in 12 months.   Josefa HERO. Demarco Bacci NP-C     11/19/2023, 9:28 AM [facility] [address] [place], [place] [address] Office [phone]    Notice: This dictation was prepared with Dragon dictation along with smaller phrase technology. Any transcriptional errors that result from this process are unintentional and may not be corrected upon review.   I spent 14 minutes examining this patient, reviewing medications, and using patient centered shared decision making involving their cardiac care.   I spent  20 minutes reviewing past medical history,  medications, and prior cardiac tests.

## 2023-11-18 NOTE — Telephone Encounter (Signed)
 Pt has been scheduled to see Josefa Beauvais, FNP 11/19/23 preop clearance for dental procedure.

## 2023-11-19 ENCOUNTER — Ambulatory Visit: Attending: General Practice | Admitting: General Practice

## 2023-11-19 ENCOUNTER — Encounter: Payer: Self-pay | Admitting: General Practice

## 2023-11-19 VITALS — BP 128/76 | HR 56 | Ht 67.0 in | Wt 191.0 lb

## 2023-11-19 DIAGNOSIS — I10 Essential (primary) hypertension: Secondary | ICD-10-CM

## 2023-11-19 DIAGNOSIS — I2510 Atherosclerotic heart disease of native coronary artery without angina pectoris: Secondary | ICD-10-CM | POA: Diagnosis not present

## 2023-11-19 DIAGNOSIS — E782 Mixed hyperlipidemia: Secondary | ICD-10-CM | POA: Diagnosis not present

## 2023-11-19 DIAGNOSIS — Z01818 Encounter for other preprocedural examination: Secondary | ICD-10-CM

## 2023-11-19 DIAGNOSIS — D6859 Other primary thrombophilia: Secondary | ICD-10-CM

## 2023-11-19 NOTE — Patient Instructions (Signed)
 Medication Instructions:   Continue current medication regimen  *If you need a refill on your cardiac medications before your next appointment, please call your pharmacy*  Lab Work:   No lab work ordered for today  If you have labs (blood work) drawn today and your tests are completely normal, you will receive your results only by: MyChart Message (if you have MyChart) OR A paper copy in the mail If you have any lab test that is abnormal or we need to change your treatment, we will call you to review the results.  Testing/Procedures: No cardiac testing ordered for today  Follow-Up: At [facility], you and your health needs are our priority.  As part of our continuing mission to provide you with exceptional heart care, our providers are all part of one team.  This team includes your primary Cardiologist (physician) and Advanced Practice Providers or APPs (Physician Assistants and Nurse Practitioners) who all work together to provide you with the care you need, when you need it.  Your next appointment:   Please follow-up in 12 months with Dr. Verlin or Josefa Beauvais NP-C    We recommend signing up for the patient portal called MyChart.  Sign up information is provided on this After Visit Summary.  MyChart is used to connect with patients for Virtual Visits (Telemedicine).  Patients are able to view lab/test results, encounter notes, upcoming appointments, etc.  Non-urgent messages can be sent to your provider as well.   To learn more about what you can do with MyChart, go to [email or web address].   Other Instructions   Increase your oral hydration.  I recommend that you drink between 50 and 64 ounces daily.

## 2023-11-25 NOTE — Telephone Encounter (Signed)
 Agree with Josefa to not hold anticoagulation.

## 2023-12-08 ENCOUNTER — Other Ambulatory Visit: Payer: Self-pay

## 2023-12-08 ENCOUNTER — Other Ambulatory Visit: Payer: Self-pay | Admitting: Family

## 2023-12-08 DIAGNOSIS — Z5181 Encounter for therapeutic drug level monitoring: Secondary | ICD-10-CM

## 2023-12-08 DIAGNOSIS — G459 Transient cerebral ischemic attack, unspecified: Secondary | ICD-10-CM

## 2023-12-08 MED ORDER — WARFARIN SODIUM 10 MG PO TABS: ORAL_TABLET | ORAL | 1 refills | Status: AC

## 2023-12-15 ENCOUNTER — Telehealth: Payer: Self-pay | Admitting: *Deleted

## 2023-12-15 NOTE — Telephone Encounter (Signed)
 Spoke with patient since INR is due. She states she is out of town and will be able to get it done on Friday. Advised we will follow up at that time.

## 2023-12-21 ENCOUNTER — Telehealth: Payer: Self-pay | Admitting: *Deleted

## 2023-12-21 NOTE — Telephone Encounter (Signed)
 Left message for the patient to call back regarding going to have INR drawn.

## 2023-12-22 ENCOUNTER — Telehealth: Payer: Self-pay | Admitting: *Deleted

## 2023-12-22 NOTE — Telephone Encounter (Signed)
 Spoke with patient and she states she went this morning to have her INR drawn. Advised will follow up once we receive the results.

## 2023-12-23 ENCOUNTER — Other Ambulatory Visit: Payer: Self-pay

## 2023-12-23 ENCOUNTER — Ambulatory Visit (INDEPENDENT_AMBULATORY_CARE_PROVIDER_SITE_OTHER): Admitting: Cardiovascular Disease

## 2023-12-23 DIAGNOSIS — Z7901 Long term (current) use of anticoagulants: Secondary | ICD-10-CM | POA: Diagnosis not present

## 2023-12-23 DIAGNOSIS — Z8679 Personal history of other diseases of the circulatory system: Secondary | ICD-10-CM

## 2023-12-23 DIAGNOSIS — D6859 Other primary thrombophilia: Secondary | ICD-10-CM

## 2023-12-23 DIAGNOSIS — G459 Transient cerebral ischemic attack, unspecified: Secondary | ICD-10-CM

## 2023-12-23 DIAGNOSIS — Z5181 Encounter for therapeutic drug level monitoring: Secondary | ICD-10-CM

## 2023-12-23 LAB — PROTIME-INR
INR: 2.4 — ABNORMAL HIGH (ref 0.9–1.2)
Prothrombin Time: 24.6 s — ABNORMAL HIGH (ref 9.1–12.0)

## 2023-12-23 NOTE — Progress Notes (Signed)
 INR 2.4 Please see anticoagulation encounter RELEASE THE STANDING ORDER TO HAVE NEXT INR DRAWN.  Spoke with pt and advised to continue taking 10mg  daily EXCEPT 5mg  on Monday, Wednesday, and Friday.  Stay consistent with greens each week (4-5 servings per week). Recheck INR in 6 weeks at [facility].  Coumadin  Clinic [phone]

## 2023-12-27 ENCOUNTER — Telehealth: Admitting: Emergency Medicine

## 2023-12-27 DIAGNOSIS — B9689 Other specified bacterial agents as the cause of diseases classified elsewhere: Secondary | ICD-10-CM

## 2023-12-27 DIAGNOSIS — J019 Acute sinusitis, unspecified: Secondary | ICD-10-CM | POA: Diagnosis not present

## 2023-12-27 MED ORDER — AMOXICILLIN-POT CLAVULANATE 875-125 MG PO TABS: 1.0000 | ORAL_TABLET | Freq: Two times a day (BID) | ORAL | 0 refills | Status: AC

## 2023-12-27 NOTE — Progress Notes (Signed)
 E-Visit for Sinus Problems  We are sorry that you are not feeling well.  Here is how we plan to help!  Based on what you have shared with me it looks like you have sinusitis.  Sinusitis is inflammation and infection in the sinus cavities of the head.  Based on your presentation I believe you most likely have Acute Bacterial Sinusitis.  This is an infection caused by bacteria and is treated with antibiotics. I have prescribed Augmentin  875mg /125mg  one tablet twice daily with food, for 7 days.   You may use plain Mucinex .   Saline nasal spray help and can safely be used as often as needed for congestion.  Try using saline irrigation, such as with a neti pot, several times a day while you are sick. Many neti pots come with salt packets premeasured to use to make saline. If you use your own salt, make sure it is kosher salt or sea salt (don't use table salt as it has iodine in it and you don't need that in your nose). Use distilled water to make saline. If you mix your own saline using your own salt, the recipe is 1/4 teaspoon salt in 1 cup warm water. Using saline irrigation can help prevent and treat sinus infections.   If you develop worsening sinus pain, fever or notice severe headache and vision changes, or if symptoms are not better after completion of antibiotic, please schedule an appointment with a health care provider.    Sinus infections are not as easily transmitted as other respiratory infection, however we still recommend that you avoid close contact with loved ones, especially the very young and elderly.  Remember to wash your hands thoroughly throughout the day as this is the number one way to prevent the spread of infection!  Home Care: Only take medications as instructed by your medical team. Complete the entire course of an antibiotic. Do not take these medications with alcohol. A steam or ultrasonic humidifier can help congestion.  You can place a towel over your head and  breathe in the steam from hot water coming from a faucet. Avoid close contacts especially the very young and the elderly. Cover your mouth when you cough or sneeze. Always remember to wash your hands.  Get Help Right Away If: You develop worsening fever or sinus pain. You develop a severe head ache or visual changes. Your symptoms persist after you have completed your treatment plan.  Make sure you Understand these instructions. Will watch your condition. Will get help right away if you are not doing well or get worse.  Your e-visit answers were reviewed by a board certified advanced clinical practitioner to complete your personal care plan.  Depending on the condition, your plan could have included both over the counter or prescription medications.  If there is a problem please reply  once you have received a response from your provider.  Your safety is important to us .  If you have drug allergies check your prescription carefully.    You can use MyChart to ask questions about today's visit, request a non-urgent call back, or ask for a work or school excuse for 24 hours related to this e-Visit. If it has been greater than 24 hours you will need to follow up with your provider, or enter a new e-Visit to address those concerns.  You will get an e-mail in the next two days asking about your experience.  I hope that your e-visit has been valuable and will  speed your recovery. Thank you for using e-visits.  I have spent 5 minutes in review of e-visit questionnaire, review and updating patient chart, medical decision making and response to patient.   Jon Belt, PhD, FNP-BC

## 2023-12-27 NOTE — Addendum Note (Signed)
 Addended by: Larri Brewton M on: 12/27/2023 02:35 PM   Modules accepted: Orders

## 2023-12-29 ENCOUNTER — Other Ambulatory Visit: Payer: Self-pay | Admitting: Family

## 2023-12-29 DIAGNOSIS — E039 Hypothyroidism, unspecified: Secondary | ICD-10-CM

## 2024-01-04 ENCOUNTER — Telehealth: Payer: Self-pay | Admitting: Cardiovascular Disease

## 2024-01-04 NOTE — Telephone Encounter (Signed)
 See previous clearance encounter. Patient says she has been in pain due to an infected tooth. [facility] is supposed to be performing an extraction + prescribing an antibiotic. However, patient says they just contacted her and informed her that they never received clearance recommendation back. Patient is requesting  updates, so she can have this matter resolved as soon as possible. Please advise.

## 2024-01-05 ENCOUNTER — Telehealth: Payer: Self-pay | Admitting: *Deleted

## 2024-01-05 NOTE — Telephone Encounter (Signed)
 Patient called to report she had her tooth extraction done today and that they told her to take ibuprofen and tylenol  for pain. She states she is in a lot of pain at this moment and needs to know if she can take the meds. Advised she can use the ibuprofen for short term and take per their recommendations and that the tylenol  is safe to take per recommended dose. Advised if she notices she has to take them longer than the short time frame to update us  and she verbalized understanding.

## 2024-01-05 NOTE — Telephone Encounter (Signed)
 Left message for pt that she was cleared 11/19/23 by Josefa Beauvais, FNP who faxed his notes that day to the dental office. I left message that his notes are clear she has been cleared and his notes indicate about NOT holding Warfarin. I will re-fax these notes the [facility] again.

## 2024-01-05 NOTE — Telephone Encounter (Signed)
 01/05/24 11:24 AM Note Left message for pt that she was cleared 11/19/23 by Josefa Beauvais, FNP who faxed his notes that day to the dental office. I left message that his notes are clear she has been cleared and his notes indicate about NOT holding Warfarin. I will re-fax these notes the [facility] again.        01/05/24 11:23 AM You attempted to contact Melinda Lopez (Left Message) Me    01/05/24 11:20 AM Note I have reviewed the notes from Josefa Beauvais, FNP who has cleared the pt and has provided the recommendations about Warfarin, please see notes.    I will re-fax notes again to dental office.        Hypercoagulability state, antiphospholipid antibody syndrome, prior TIA-reports compliance with warfarin.  Denies bleeding issues. Continue warfarin Precautions reviewed   Preoperative cardiac evaluation-Dental Extraction - Amount of Teeth to be Pulled:  1 tooth, crown prep, deep cleaning, gingivectomy   Date of Surgery:  Clearance TBD                                Surgeon:  Dr. Arlana Socks Group or Practice Name:  [facility]      Phone number:  [phone] Fax number:  [phone]       Primary Cardiologist: Melinda Cash, MD   Chart reviewed as part of pre-operative protocol coverage. Simple dental extractions are considered low risk procedures per guidelines and generally do not require any specific cardiac clearance. It is also generally accepted that for simple extractions and dental cleanings, there is no need to interrupt blood thinner therapy.    SBE prophylaxis is not required for the patient.   Patient's warfarin prescribed for history of TIA, antiphospholipid syndrome.  Would recommend warfarin not be held prior to upcoming procedure.   Disposition: Follow-up with Dr. Cash or me in 12 months.     Josefa HERO. Cleaver NP-C       01/04/24  5:08 PM Melinda Lopez routed this conversation to Cv Div Preop Callback  Melinda Perry FORBES MIGDALIA    01/04/24  5:08 PM Note See previous clearance encounter. Patient says she has been in pain due to an infected tooth. [facility] is supposed to be performing an extraction + prescribing an antibiotic. However, patient says they just contacted her and informed her that they never received clearance recommendation back. Patient is requesting  updates, so she can have this matter resolved as soon as possible. Please advise.         01/04/24  5:05 PM Melinda Lopez contacted Terry, Melinda Lopez

## 2024-01-05 NOTE — Telephone Encounter (Signed)
 I have reviewed the notes from Josefa Beauvais, FNP who has cleared the pt and has provided the recommendations about Warfarin, please see notes.   I will re-fax notes again to dental office.     Hypercoagulability state, antiphospholipid antibody syndrome, prior TIA-reports compliance with warfarin.  Denies bleeding issues. Continue warfarin Precautions reviewed   Preoperative cardiac evaluation-Dental Extraction - Amount of Teeth to be Pulled:  1 tooth, crown prep, deep cleaning, gingivectomy   Date of Surgery:  Clearance TBD                                Surgeon:  Dr. Arlana Socks Group or Practice Name:  [facility]      Phone number:  [phone] Fax number:  [phone]       Primary Cardiologist: Lonni Cash, MD   Chart reviewed as part of pre-operative protocol coverage. Simple dental extractions are considered low risk procedures per guidelines and generally do not require any specific cardiac clearance. It is also generally accepted that for simple extractions and dental cleanings, there is no need to interrupt blood thinner therapy.    SBE prophylaxis is not required for the patient.   Patient's warfarin prescribed for history of TIA, antiphospholipid syndrome.  Would recommend warfarin not be held prior to upcoming procedure.   Disposition: Follow-up with Dr. Cash or me in 12 months.     Josefa HERO. Cleaver NP-C

## 2024-01-13 ENCOUNTER — Other Ambulatory Visit: Payer: Self-pay

## 2024-01-13 DIAGNOSIS — Z8679 Personal history of other diseases of the circulatory system: Secondary | ICD-10-CM

## 2024-01-13 DIAGNOSIS — Z5181 Encounter for therapeutic drug level monitoring: Secondary | ICD-10-CM

## 2024-01-15 LAB — PROTIME-INR
INR: 2.3 — ABNORMAL HIGH (ref 0.9–1.2)
Prothrombin Time: 23.8 s — ABNORMAL HIGH (ref 9.1–12.0)

## 2024-01-17 ENCOUNTER — Ambulatory Visit (INDEPENDENT_AMBULATORY_CARE_PROVIDER_SITE_OTHER): Admitting: Cardiology

## 2024-01-17 DIAGNOSIS — Z7901 Long term (current) use of anticoagulants: Secondary | ICD-10-CM | POA: Diagnosis not present

## 2024-01-17 DIAGNOSIS — G459 Transient cerebral ischemic attack, unspecified: Secondary | ICD-10-CM | POA: Diagnosis not present

## 2024-01-17 DIAGNOSIS — D6859 Other primary thrombophilia: Secondary | ICD-10-CM

## 2024-01-17 NOTE — Progress Notes (Signed)
 INR 2.3 Please see anticoagulation encounter RELEASE THE STANDING ORDER TO HAVE NEXT INR DRAWN.  Spoke with pt and advised to continue taking 10mg  daily EXCEPT 5mg  on Monday, Wednesday, and Friday.  Stay consistent with greens each week (4-5 servings per week). Recheck INR in 6 weeks at [facility].  [facility] [phone]

## 2024-01-18 ENCOUNTER — Ambulatory Visit: Admitting: Family

## 2024-01-24 ENCOUNTER — Other Ambulatory Visit: Payer: Self-pay | Admitting: Family

## 2024-01-24 DIAGNOSIS — E785 Hyperlipidemia, unspecified: Secondary | ICD-10-CM

## 2024-02-24 ENCOUNTER — Telehealth: Admitting: Family Medicine

## 2024-02-24 DIAGNOSIS — B9689 Other specified bacterial agents as the cause of diseases classified elsewhere: Secondary | ICD-10-CM

## 2024-02-24 DIAGNOSIS — J019 Acute sinusitis, unspecified: Secondary | ICD-10-CM

## 2024-02-24 MED ORDER — DOXYCYCLINE HYCLATE 100 MG PO TABS: 100.0000 mg | ORAL_TABLET | Freq: Two times a day (BID) | ORAL | 0 refills | Status: AC

## 2024-02-24 NOTE — Progress Notes (Signed)
 E-Visit for Sinus Problems  We are sorry that you are not feeling well.  Here is how we plan to help!  Based on what you have shared with me it looks like you have sinusitis.  Sinusitis is inflammation and infection in the sinus cavities of the head.  Based on your presentation I believe you most likely have Acute Bacterial Sinusitis.  This is an infection caused by bacteria and is treated with antibiotics. I have prescribed Doxycycline 100mg  by mouth twice a day for 7 days. You may use an oral decongestant such as Mucinex D or if you have glaucoma or high blood pressure use plain Mucinex. Saline nasal spray help and can safely be used as often as needed for congestion.  If you develop worsening sinus pain, fever or notice severe headache and vision changes, or if symptoms are not better after completion of antibiotic, please schedule an appointment with a health care provider.    Sinus infections are not as easily transmitted as other respiratory infection, however we still recommend that you avoid close contact with loved ones, especially the very young and elderly.  Remember to wash your hands thoroughly throughout the day as this is the number one way to prevent the spread of infection!  Home Care: Only take medications as instructed by your medical team. Complete the entire course of an antibiotic. Do not take these medications with alcohol. A steam or ultrasonic humidifier can help congestion.  You can place a towel over your head and breathe in the steam from hot water coming from a faucet. Avoid close contacts especially the very young and the elderly. Cover your mouth when you cough or sneeze. Always remember to wash your hands.  Get Help Right Away If: You develop worsening fever or sinus pain. You develop a severe head ache or visual changes. Your symptoms persist after you have completed your treatment plan.  Make sure you Understand these instructions. Will watch your  condition. Will get help right away if you are not doing well or get worse.  Your e-visit answers were reviewed by a board certified advanced clinical practitioner to complete your personal care plan.  Depending on the condition, your plan could have included both over the counter or prescription medications.  If there is a problem please reply  once you have received a response from your provider.  Your safety is important to us .  If you have drug allergies check your prescription carefully.    You can use MyChart to ask questions about today's visit, request a non-urgent call back, or ask for a work or school excuse for 24 hours related to this e-Visit. If it has been greater than 24 hours you will need to follow up with your provider, or enter a new e-Visit to address those concerns.  You will get an e-mail in the next two days asking about your experience.  I hope that your e-visit has been valuable and will speed your recovery. Thank you for using e-visits.  I have spent 5 minutes in review of e-visit questionnaire, review and updating patient chart, medical decision making and response to patient.   Chiquita CHRISTELLA Barefoot, NP

## 2024-02-29 ENCOUNTER — Ambulatory Visit: Payer: Self-pay

## 2024-02-29 NOTE — Telephone Encounter (Signed)
 Attempted to return call. First call was answered then dropped. Attempted to call back but NA. LM for pt to return call to clinic to f/u on symptoms. Attempt #1   Message from Merkel G sent at 02/29/2024  5:19 PM EST  Reason for Triage: Antibiotics(Doxycycline ) are not working. She has a headache, cough, and low grade fever of 101.2.

## 2024-02-29 NOTE — Telephone Encounter (Signed)
 2nd CB attempt. LVM to call office back.     Message from Arcol[place] G sent at 02/29/2024  5:19 PM EST  Reason for Triage: Antibiotics(Doxycycline ) are not working. She has a headache, cough, and low grade fever of 101.2.

## 2024-03-01 ENCOUNTER — Telehealth: Payer: Self-pay | Admitting: *Deleted

## 2024-03-01 ENCOUNTER — Other Ambulatory Visit: Payer: Self-pay | Admitting: *Deleted

## 2024-03-01 DIAGNOSIS — Z5181 Encounter for therapeutic drug level monitoring: Secondary | ICD-10-CM

## 2024-03-01 DIAGNOSIS — Z8679 Personal history of other diseases of the circulatory system: Secondary | ICD-10-CM

## 2024-03-01 NOTE — Telephone Encounter (Signed)
 Patient was seen at [facility] yesterday, antibiotic was changed

## 2024-03-01 NOTE — Telephone Encounter (Signed)
 Patient called and stated she was on doxycycline  but stooped yesterday and was switched to a zpak, Augmentin  and prednisone  10mg  taper.  She is aware that the prednisone  will interact with warfarin and that the zpak can sometimes interact as well advised she should have some leafy veggies today. Prednisone  taper in 60mg  x 4 days started 02/29/24, 40mg  x 4 days, 20mg  x 4 days. She will go have INR drawn on Thursday or Friday since the med interactions. Advised patient to take 1/2 tablet of warfarin today and tomorrow as well and she verbalized understanding.

## 2024-03-01 NOTE — Telephone Encounter (Signed)
 Called patient and left a message inquiring if she will be able to go to the lab today for INR draw. In the message advised to update us  when she cans by calling us  back.

## 2024-03-03 ENCOUNTER — Telehealth: Payer: Self-pay | Admitting: *Deleted

## 2024-03-03 ENCOUNTER — Ambulatory Visit (INDEPENDENT_AMBULATORY_CARE_PROVIDER_SITE_OTHER): Admitting: *Deleted

## 2024-03-03 DIAGNOSIS — G459 Transient cerebral ischemic attack, unspecified: Secondary | ICD-10-CM

## 2024-03-03 DIAGNOSIS — Z7901 Long term (current) use of anticoagulants: Secondary | ICD-10-CM | POA: Diagnosis not present

## 2024-03-03 DIAGNOSIS — D6859 Other primary thrombophilia: Secondary | ICD-10-CM | POA: Diagnosis not present

## 2024-03-03 LAB — PROTIME-INR
INR: 1.7 — ABNORMAL HIGH (ref 0.9–1.2)
Prothrombin Time: 18 s — ABNORMAL HIGH (ref 9.1–12.0)

## 2024-03-03 NOTE — Patient Instructions (Addendum)
 Description   RELEASE THE STANDING ORDER TO HAVE NEXT INR DRAWN. INR-1.7; Spoke with pt and advised since taking prednisone  taper to continue taking 10mg  daily EXCEPT 5mg  on Monday, Wednesday, and Friday. Recheck INR at [facility] on Wednesday.  Stay consistent with greens each week (4-5 servings per week). Recheck INR in (normally 6 weeks) at [facility].  [facility] [phone]

## 2024-03-03 NOTE — Progress Notes (Signed)
 Prednisone  taper  Description   RELEASE THE STANDING ORDER TO HAVE NEXT INR DRAWN. INR-1.7; Spoke with pt and advised since taking prednisone  taper to continue taking 10mg  daily EXCEPT 5mg  on Monday, Wednesday, and Friday. Recheck INR at [facility] on Wednesday.  Stay consistent with greens each week (4-5 servings per week). Recheck INR in (normally 6 weeks) at [facility].  [facility]  Clinic [phone]

## 2024-03-03 NOTE — Telephone Encounter (Signed)
 Called patient since she was due to go to the lab today, she states went early this morning and it was standing room only so she left and came back around lunch and had it drawn. Advised it it comes back before I leave will call her back and address level.

## 2024-03-07 ENCOUNTER — Encounter: Payer: Self-pay | Admitting: Medical

## 2024-03-07 ENCOUNTER — Ambulatory Visit: Payer: Self-pay | Admitting: *Deleted

## 2024-03-07 ENCOUNTER — Ambulatory Visit (HOSPITAL_BASED_OUTPATIENT_CLINIC_OR_DEPARTMENT_OTHER)
Admission: RE | Admit: 2024-03-07 | Discharge: 2024-03-07 | Disposition: A | Source: Ambulatory Visit | Attending: Medical | Admitting: Medical

## 2024-03-07 ENCOUNTER — Ambulatory Visit: Admitting: Medical

## 2024-03-07 ENCOUNTER — Ambulatory Visit: Payer: Self-pay | Admitting: Medical

## 2024-03-07 VITALS — BP 140/80 | HR 66 | Temp 98.0°F | Resp 17 | Ht 67.0 in | Wt 188.0 lb

## 2024-03-07 DIAGNOSIS — J40 Bronchitis, not specified as acute or chronic: Secondary | ICD-10-CM

## 2024-03-07 DIAGNOSIS — R059 Cough, unspecified: Secondary | ICD-10-CM

## 2024-03-07 DIAGNOSIS — R062 Wheezing: Secondary | ICD-10-CM

## 2024-03-07 MED ORDER — BUDESONIDE-FORMOTEROL FUMARATE 80-4.5 MCG/ACT IN AERO: 2.0000 | INHALATION_SPRAY | Freq: Two times a day (BID) | RESPIRATORY_TRACT | 0 refills | Status: AC

## 2024-03-07 NOTE — Telephone Encounter (Signed)
 Appt scheduled

## 2024-03-07 NOTE — Patient Instructions (Addendum)
 Bronchitis with persistent cough and wheezing Persistent cough and wheezing despite initial treatments. Considered pneumonia or RSV as we discussed.. Oxygen saturation at 97%. No asthma history, but remote smoking history noted. Discussed Symbicort  for residual symptoms. - Ordered chest x-ray to evaluate for pneumonia. - Ordered RSV test with 2-3 day turnaround per staff(note pt indicated wanted rsv test. Flu and covid not indicated due to duration of illness.) - Prescribed Symbicort  for residual dry cough or wheezing post-prednisone  taper. - Advised prednisone  taper: 30 mg for 2 days, 20 mg for 2 days, 10 mg for 2 days. - Advised doubling benzonatate  dose for cough.(Max dosoe of benzonatate  is 200 mg) - Continue albuterol  as needed. - Continue using cool vaporizer at night.  Acute sinusitis(but clincally appears patient sinus infections resolved). Symptoms improving with current treatment. Took doxy but stopped. e-mycin  and augmentin (now only on augmentin . Recommend continue until finish full course)  Follow up date to be determined after lab review

## 2024-03-07 NOTE — Telephone Encounter (Signed)
 FYI Only or Action Required?: FYI only for provider: appointment scheduled on 03/07/24.  Patient was last seen in primary care on 07/14/2023 by Daryl Setter, NP.  Called Nurse Triage reporting Shortness of Breath.  Symptoms began several weeks ago.  Interventions attempted: Prescription medications: see chart for antibiotics and prednisone  and Rest, hydration, or home remedies.  Symptoms are: gradually worsening.  Triage Disposition: See HCP Within 4 Hours (Or PCP Triage)  Patient/caregiver understands and will follow disposition?: Yes                Reason for Disposition  Continuous (nonstop) coughing interferes with work, school, or sleeping  Answer Assessment - Initial Assessment Questions Appt scheduled today with other provider. Pt reports sinus infection has been treated with multiple antibiotics and patient is still having sx. Recommended if swallowing difficulty or worsening sx go to ED.      1. RESPIRATORY STATUS: Describe your breathing? (e.g., wheezing, shortness of breath, unable to speak, severe coughing)      Shortness of breath , wheezing , constant coughing at night 2. ONSET: When did this breathing problem begin?      Couple of weeks ago  3. PATTERN Does the difficult breathing come and go, or has it been constant since it started?      Comes and goes mostly at night now 4. SEVERITY: How bad is your breathing? (e.g., mild, moderate, severe)      Not as bad after antibiotics 5. RECURRENT SYMPTOM: Have you had difficulty breathing before? If Yes, ask: When was the last time? and What happened that time?      Has been taking antibiotics for sinus infection couple of weeks. 6. CARDIAC HISTORY: Do you have any history of heart disease? (e.g., heart attack, angina, bypass surgery, angioplasty)      na 7. LUNG HISTORY: Do you have any history of lung disease?  (e.g., pulmonary embolus, asthma, emphysema)     na 8. CAUSE: What do  you think is causing the breathing problem?      Sinus infection 9. OTHER SYMPTOMS: Do you have any other symptoms? (e.g., chest pain, cough, dizziness, fever, runny nose)     Cough at night, SOB at times, wheezing noted. Feels like something in throat , can swallow and breathe. No chest pain no difficulty breathing no fever, no dizziness.  Protocols used: Breathing Difficulty-A-AH

## 2024-03-08 ENCOUNTER — Telehealth: Payer: Self-pay | Admitting: *Deleted

## 2024-03-08 ENCOUNTER — Other Ambulatory Visit: Payer: Self-pay | Admitting: *Deleted

## 2024-03-08 ENCOUNTER — Ambulatory Visit: Payer: Self-pay | Admitting: *Deleted

## 2024-03-08 DIAGNOSIS — D6859 Other primary thrombophilia: Secondary | ICD-10-CM

## 2024-03-08 DIAGNOSIS — G459 Transient cerebral ischemic attack, unspecified: Secondary | ICD-10-CM | POA: Diagnosis not present

## 2024-03-08 DIAGNOSIS — Z8679 Personal history of other diseases of the circulatory system: Secondary | ICD-10-CM

## 2024-03-08 DIAGNOSIS — Z5181 Encounter for therapeutic drug level monitoring: Secondary | ICD-10-CM

## 2024-03-08 DIAGNOSIS — Z7901 Long term (current) use of anticoagulants: Secondary | ICD-10-CM | POA: Diagnosis not present

## 2024-03-08 LAB — PROTIME-INR
INR: 4 — ABNORMAL HIGH (ref 0.9–1.2)
Prothrombin Time: 41 s — ABNORMAL HIGH (ref 9.1–12.0)

## 2024-03-08 LAB — RSV SCREEN (NASOPHARYNGEAL) NOT AT ARMC
MICRO NUMBER:: 17542599
RESULT:: NOT DETECTED
SPECIMEN QUALITY:: ADEQUATE

## 2024-03-08 NOTE — Patient Instructions (Signed)
 Description   RELEASE THE STANDING ORDER TO HAVE NEXT INR DRAWN. INR-4.0; Spoke with pt and advised since taking prednisone  taper (20mg  2/4and 2/5 and 10mg  2/6 & 2/7 then done) to continue taking 10mg  daily EXCEPT 5mg  on Monday, Wednesday, and Friday. Recheck INR at [facility] on Wednesday.  Stay consistent with greens each week (4-5 servings per week). Recheck INR in (normally 6 weeks) at [facility].  [facility]  Clinic [phone]

## 2024-03-08 NOTE — Telephone Encounter (Signed)
 Spoke with patient to inquire if she remembered that she is due to have her INR drawn today at [facility] since taking prednisone . She states she will go after her other appointment mid morning today. She is aware we will follow up once labs received.

## 2024-03-08 NOTE — Progress Notes (Signed)
 Description   RELEASE THE STANDING ORDER TO HAVE NEXT INR DRAWN. INR-4.0; Spoke with pt and advised since taking prednisone  taper (20mg  2/4and 2/5 and 10mg  2/6 & 2/7 then done) to continue taking 10mg  daily EXCEPT 5mg  on Monday, Wednesday, and Friday. Recheck INR at [facility] on Wednesday.  Stay consistent with greens each week (4-5 servings per week). Recheck INR in (normally 6 weeks) at [facility].  [facility]  Clinic [phone]

## 2024-03-14 ENCOUNTER — Ambulatory Visit: Admitting: Family

## 2024-04-04 ENCOUNTER — Ambulatory Visit: Admitting: Family

## 2024-04-26 ENCOUNTER — Ambulatory Visit: Admitting: Family
